# Patient Record
Sex: Female | Born: 1960 | Race: White | Hispanic: No | Marital: Married | Smoking: Former smoker
Health system: Southern US, Community
[De-identification: ages and names within clinical notes are randomized; demographics above are authoritative.]

## PROBLEM LIST (undated history)

## (undated) DIAGNOSIS — I219 Acute myocardial infarction, unspecified: Secondary | ICD-10-CM

## (undated) DIAGNOSIS — F32A Depression, unspecified: Secondary | ICD-10-CM

## (undated) DIAGNOSIS — Z87442 Personal history of urinary calculi: Secondary | ICD-10-CM

## (undated) DIAGNOSIS — I10 Essential (primary) hypertension: Secondary | ICD-10-CM

## (undated) DIAGNOSIS — N200 Calculus of kidney: Secondary | ICD-10-CM

## (undated) DIAGNOSIS — F329 Major depressive disorder, single episode, unspecified: Secondary | ICD-10-CM

## (undated) DIAGNOSIS — I2510 Atherosclerotic heart disease of native coronary artery without angina pectoris: Secondary | ICD-10-CM

## (undated) DIAGNOSIS — D509 Iron deficiency anemia, unspecified: Secondary | ICD-10-CM

## (undated) DIAGNOSIS — K219 Gastro-esophageal reflux disease without esophagitis: Secondary | ICD-10-CM

## (undated) DIAGNOSIS — E119 Type 2 diabetes mellitus without complications: Secondary | ICD-10-CM

## (undated) DIAGNOSIS — E782 Mixed hyperlipidemia: Secondary | ICD-10-CM

## (undated) DIAGNOSIS — F419 Anxiety disorder, unspecified: Secondary | ICD-10-CM

## (undated) DIAGNOSIS — N179 Acute kidney failure, unspecified: Secondary | ICD-10-CM

## (undated) DIAGNOSIS — I209 Angina pectoris, unspecified: Secondary | ICD-10-CM

## (undated) HISTORY — DX: Gastro-esophageal reflux disease without esophagitis: K21.9

## (undated) HISTORY — DX: Acute myocardial infarction, unspecified: I21.9

## (undated) HISTORY — DX: Anxiety disorder, unspecified: F41.9

## (undated) HISTORY — DX: Essential (primary) hypertension: I10

## (undated) HISTORY — DX: Mixed hyperlipidemia: E78.2

## (undated) HISTORY — DX: Calculus of kidney: N20.0

## (undated) HISTORY — DX: Acute kidney failure, unspecified: N17.9

## (undated) HISTORY — DX: Depression, unspecified: F32.A

## (undated) HISTORY — DX: Iron deficiency anemia, unspecified: D50.9

## (undated) HISTORY — DX: Type 2 diabetes mellitus without complications: E11.9

## (undated) HISTORY — DX: Major depressive disorder, single episode, unspecified: F32.9

## (undated) HISTORY — PX: KNEE SURGERY: SHX244

## (undated) HISTORY — DX: Atherosclerotic heart disease of native coronary artery without angina pectoris: I25.10

## (undated) HISTORY — PX: SINUS SURGERY WITH INSTATRAK: SHX5215

## (undated) HISTORY — PX: BACK SURGERY: SHX140

---
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
## 1998-11-21 ENCOUNTER — Other Ambulatory Visit: Admission: RE | Admit: 1998-11-21 | Discharge: 1998-11-21 | Payer: Self-pay | Admitting: Obstetrics and Gynecology

## 1999-01-08 ENCOUNTER — Ambulatory Visit (HOSPITAL_COMMUNITY): Admission: RE | Admit: 1999-01-08 | Discharge: 1999-01-08 | Payer: Self-pay | Admitting: Obstetrics and Gynecology

## 2010-07-23 ENCOUNTER — Inpatient Hospital Stay (HOSPITAL_COMMUNITY): Payer: BC Managed Care – PPO

## 2010-07-23 ENCOUNTER — Inpatient Hospital Stay (HOSPITAL_COMMUNITY)
Admission: EM | Admit: 2010-07-23 | Discharge: 2010-07-26 | DRG: 122 | Disposition: A | Discharge status: Home / Self Care | Payer: BC Managed Care – PPO | Source: Other Acute Inpatient Hospital | Attending: Cardiology | Admitting: Cardiology

## 2010-07-23 DIAGNOSIS — I214 Non-ST elevation (NSTEMI) myocardial infarction: Principal | ICD-10-CM | POA: Diagnosis present

## 2010-07-23 DIAGNOSIS — D500 Iron deficiency anemia secondary to blood loss (chronic): Secondary | ICD-10-CM

## 2010-07-23 DIAGNOSIS — E669 Obesity, unspecified: Secondary | ICD-10-CM | POA: Diagnosis present

## 2010-07-23 DIAGNOSIS — I2510 Atherosclerotic heart disease of native coronary artery without angina pectoris: Secondary | ICD-10-CM | POA: Diagnosis present

## 2010-07-23 DIAGNOSIS — E119 Type 2 diabetes mellitus without complications: Secondary | ICD-10-CM | POA: Diagnosis present

## 2010-07-23 DIAGNOSIS — F3289 Other specified depressive episodes: Secondary | ICD-10-CM | POA: Diagnosis present

## 2010-07-23 DIAGNOSIS — I10 Essential (primary) hypertension: Secondary | ICD-10-CM | POA: Diagnosis present

## 2010-07-23 DIAGNOSIS — N920 Excessive and frequent menstruation with regular cycle: Secondary | ICD-10-CM | POA: Diagnosis present

## 2010-07-23 DIAGNOSIS — F329 Major depressive disorder, single episode, unspecified: Secondary | ICD-10-CM | POA: Diagnosis present

## 2010-07-23 DIAGNOSIS — R079 Chest pain, unspecified: Secondary | ICD-10-CM

## 2010-07-23 DIAGNOSIS — D259 Leiomyoma of uterus, unspecified: Secondary | ICD-10-CM | POA: Diagnosis present

## 2010-07-23 DIAGNOSIS — Z7982 Long term (current) use of aspirin: Secondary | ICD-10-CM

## 2010-07-23 DIAGNOSIS — K219 Gastro-esophageal reflux disease without esophagitis: Secondary | ICD-10-CM | POA: Diagnosis present

## 2010-07-23 DIAGNOSIS — R7989 Other specified abnormal findings of blood chemistry: Secondary | ICD-10-CM

## 2010-07-23 LAB — LIPID PANEL
Cholesterol: 175 mg/dL (ref 0–200)
Total CHOL/HDL Ratio: 5.1 RATIO
VLDL: 45 mg/dL — ABNORMAL HIGH (ref 0–40)

## 2010-07-23 LAB — CK TOTAL AND CKMB (NOT AT ARMC)
CK, MB: 3.9 ng/mL (ref 0.3–4.0)
Relative Index: 3 — ABNORMAL HIGH (ref 0.0–2.5)
Relative Index: 2.4 (ref 0.0–2.5)
Total CK: 233 U/L — ABNORMAL HIGH (ref 7–177)
Total CK: 165 U/L (ref 7–177)

## 2010-07-23 LAB — IRON AND TIBC
Iron: <10 ug/dL — ABNORMAL LOW (ref 42–135)
UIBC: 359 ug/dL

## 2010-07-23 LAB — CBC
HCT: 27.2 % — ABNORMAL LOW (ref 36.0–46.0)
MCH: 22.9 pg — ABNORMAL LOW (ref 26.0–34.0)
MCH: 22.8 pg — ABNORMAL LOW (ref 26.0–34.0)
MCHC: 31.4 g/dL (ref 30.0–36.0)
MCHC: 31.3 g/dL (ref 30.0–36.0)
Platelets: 222 10*3/uL (ref 150–400)
RDW: 14.4 % (ref 11.5–15.5)

## 2010-07-23 LAB — PROTIME-INR: Prothrombin Time: 13.2 seconds (ref 11.6–15.2)

## 2010-07-23 LAB — BASIC METABOLIC PANEL
GFR calc Af Amer: >60 mL/min (ref >60)
GFR calc non Af Amer: >60 mL/min (ref >60)
Glucose, Bld: 227 mg/dL — ABNORMAL HIGH (ref 70–99)
Potassium: 3.2 mEq/L — ABNORMAL LOW (ref 3.5–5.1)
Sodium: 134 mEq/L — ABNORMAL LOW (ref 135–145)

## 2010-07-23 LAB — GLUCOSE, CAPILLARY
Glucose-Capillary: 301 mg/dL — ABNORMAL HIGH (ref 70–99)
Glucose-Capillary: 236 mg/dL — ABNORMAL HIGH (ref 70–99)

## 2010-07-23 LAB — TROPONIN I
Troponin I: 3.22 ng/mL (ref <0.30)
Troponin I: 2.5 ng/mL (ref <0.30)
Troponin I: 0.87 ng/mL (ref <0.30)

## 2010-07-23 LAB — HEPARIN LEVEL (UNFRACTIONATED): Heparin Unfractionated: 0.17 IU/mL — ABNORMAL LOW (ref 0.30–0.70)

## 2010-07-23 LAB — HEMOGLOBIN A1C: Mean Plasma Glucose: 292 mg/dL — ABNORMAL HIGH (ref <117)

## 2010-07-24 DIAGNOSIS — D649 Anemia, unspecified: Secondary | ICD-10-CM

## 2010-07-24 DIAGNOSIS — I2510 Atherosclerotic heart disease of native coronary artery without angina pectoris: Secondary | ICD-10-CM

## 2010-07-24 LAB — GLUCOSE, CAPILLARY
Glucose-Capillary: 243 mg/dL — ABNORMAL HIGH (ref 70–99)
Glucose-Capillary: 187 mg/dL — ABNORMAL HIGH (ref 70–99)
Glucose-Capillary: 235 mg/dL — ABNORMAL HIGH (ref 70–99)
Glucose-Capillary: 189 mg/dL — ABNORMAL HIGH (ref 70–99)

## 2010-07-24 LAB — BASIC METABOLIC PANEL
BUN: 10 mg/dL (ref 6–23)
CO2: 26 mEq/L (ref 19–32)
Chloride: 101 mEq/L (ref 96–112)
Creatinine, Ser: 0.67 mg/dL (ref 0.4–1.2)
Glucose, Bld: 208 mg/dL — ABNORMAL HIGH (ref 70–99)

## 2010-07-24 LAB — HEPARIN LEVEL (UNFRACTIONATED)
Heparin Unfractionated: 0.23 IU/mL — ABNORMAL LOW (ref 0.30–0.70)
Heparin Unfractionated: 0.51 IU/mL (ref 0.30–0.70)

## 2010-07-24 LAB — CBC
Hemoglobin: 8.1 g/dL — ABNORMAL LOW (ref 12.0–15.0)
MCH: 23.1 pg — ABNORMAL LOW (ref 26.0–34.0)
MCHC: 31.3 g/dL (ref 30.0–36.0)
MCV: 73.8 fL — ABNORMAL LOW (ref 78.0–100.0)

## 2010-07-24 NOTE — H&P (Signed)
NAMEIVORY, MADURO               ACCOUNT NO.:  [id number]  MEDICAL RECORD NO.:  [id number]           PATIENT TYPE:  I  LOCATION:  2302                         FACILITY:  [facility]  PHYSICIAN:  Georga Hacking, M.D.DATE OF BIRTH:  1960/09/27  DATE OF ADMISSION:  07/23/2010                             HISTORY & PHYSICAL   A 50 year old female who is transferred from [facility] for evaluation of chest pain and abnormal cardiac enzymes.  The patient has a history of hypertension, diabetes, and depression as well as reflux. She presented to the [facility] emergency room today with a 4-day history of chest discomfort.  She describes the pain as a dull aching pain, has been constant over the past 4 days, but with wax and wane in intensity. It would occasionally take her breath.  It was not pleuritic and not made worse with exercise, and because of the persistence of pain, she eventually sought help with the emergency room.  Initially, she was thought to have atypical symptoms more GI in origin.  The cardiac enzymes came back abnormal and the emergency room physician sought Cardiology consultation.  She has not had chest discomfort prior to this.  She was hospitalized last November with bronchitis.  She denies PND, orthopnea, edema, syncope, or claudication, but has felt some pounding of her heartbeat the past few days.  PAST HISTORY:  Hypertension, diabetes, esophageal reflux, kidney stones, and depression.  She does not know about hyperlipidemia.  SURGICAL HISTORY:  Knee surgery.  ALLERGIES:  None.  CURRENT MEDICATIONS: 1. Metformin 500 b.i.d. 2. Lisinopril 5 mg daily. 3. Paxil 10 mg daily. 4. Alprazolam 1 mg daily. 5. Omeprazole 40 mg daily.  FAMILY HISTORY:  Mother is living, age 61.  Father died of C. difficile at 32, three sisters are living.  No family history of premature heart disease.  SOCIAL HISTORY:  Married with a daughter.  She quit smoking in 2003 after  having smoked for 20-30 years one and one and a half packs per day.  She works as a Engineer, production for [employer].  Has significant alcohol use.  REVIEW OF SYSTEMS:  Reports some weight loss this year.  Denies skin problems.  She wears glasses, but has no glaucoma, cataracts, or macular degeneration.  No sinusitis or epistaxis.  She normally has no cough, wheezing, or hemoptysis.  She has a history of esophageal reflux, but has not had any nausea, constipation, or diarrhea.  Denies change in stool color.  No dysuria, urgency, or frequency.  She has occasional mild arthritis involving her lower back.  No history of stroke or TIA. Other than as noted above, the remainder of review of systems is unremarkable.  PHYSICAL EXAMINATION:  GENERAL:  She is a pleasant, obese, white female, who currently is in no acute distress. VITAL SIGNS:  Blood pressure is 125/70, pulse is currently 72 and regular. SKIN:  Warm and dry with no obvious masses or lesions. ENT:  EOMI.  PERRLA.  C and S clear.  Funduscopic exam not examined. Pharynx negative. NECK:  Supple without masses, JVD, thyromegaly, or bruits. LUNGS:  Clear to A and  P. CARDIAC:  Normal S1 and S2.  No S3, S4, or rub.  There is 1/6 systolic murmur heard at the aortic area. ABDOMEN:  Shows mildly tender epigastric area and in the right upper quadrant.  No rebound is noted.  Femoral pulses 2+.  Distal pulses are bounding and are 3+.  There is no edema noted. CRANIAL NERVES:  Sensory and motor were normal.  A 12-lead EKG done at [facility] did not show any acute ST change.  Review of laboratory data there showed anemia, it was microcytic.  Her albumin was 3.4, glucose is 361.  BUN was normal.  Creatinine is 0.72.  D-dimer was 0.6 slightly elevated.  Hematocrit was 30.2, hemoglobin is 9.8. Potassium is reported at 2.6.  Troponin is 0.73.  CPK total was elevated with an MB of 7.9.  IMPRESSION: 1. Chest discomfort with some atypical features,  but suggestive of     unstable angina pectoris.  Positive cardiac enzymes with no EKG     changes. 2. Diabetes mellitus, poorly controlled. 3. Hypertension. 4. Obesity. 5. History of esophageal reflux. 6. History of depression. 7. History of kidney stones.  RECOMMENDATIONS:  The patient would be placed on heparin, IV nitroglycerin, begun on beta blockers and aspirin, recheck serial enzymes.  If potassium remains low, give additional supplementation as she did have supplementation at [facility].  Because of her positive enzymes and diabetic state, she will likely need to have cardiac catheterization.  She will be kept n.p.o. for this.  Cardiac catheterization procedure were discussed with her fully including risks and she is agreeable and willing to proceed.     Georga Hacking, M.D.     WST/MEDQ  D:  07/23/2010  T:  07/23/2010  Job:  [id number]  cc:   Kirstie Peri, MD [facility]  Electronically Signed by W. Donnie Aho M.D. on 07/24/2010 01:09:27 PM

## 2010-07-25 LAB — CBC
Hemoglobin: 8.3 g/dL — ABNORMAL LOW (ref 12.0–15.0)
MCH: 22.6 pg — ABNORMAL LOW (ref 26.0–34.0)
MCV: 73.6 fL — ABNORMAL LOW (ref 78.0–100.0)
Platelets: 213 10*3/uL (ref 150–400)
RBC: 3.67 MIL/uL — ABNORMAL LOW (ref 3.87–5.11)
WBC: 5.5 10*3/uL (ref 4.0–10.5)

## 2010-07-25 LAB — GLUCOSE, CAPILLARY
Glucose-Capillary: 167 mg/dL — ABNORMAL HIGH (ref 70–99)
Glucose-Capillary: 230 mg/dL — ABNORMAL HIGH (ref 70–99)
Glucose-Capillary: 169 mg/dL — ABNORMAL HIGH (ref 70–99)

## 2010-07-25 LAB — BASIC METABOLIC PANEL
CO2: 25 mEq/L (ref 19–32)
Chloride: 104 mEq/L (ref 96–112)
Creatinine, Ser: 0.68 mg/dL (ref 0.4–1.2)
GFR calc Af Amer: >60 mL/min (ref >60)
Potassium: 3.7 mEq/L (ref 3.5–5.1)

## 2010-07-26 LAB — GLUCOSE, CAPILLARY: Glucose-Capillary: 168 mg/dL — ABNORMAL HIGH (ref 70–99)

## 2010-07-27 LAB — TYPE AND SCREEN
ABO/RH(D): O POS
Unit division: 0

## 2010-08-01 NOTE — Discharge Summary (Signed)
[name], Natalie Tanner               ACCOUNT NO.:  [id number]  MEDICAL RECORD NO.:  [id number]           PATIENT TYPE:  I  LOCATION:  2033                         FACILITY:  [facility]  PHYSICIAN:  Noralyn Pick. Eden Emms, MD, FACCDATE OF BIRTH:  09/03/1960  DATE OF ADMISSION:  07/23/2010 DATE OF DISCHARGE:  07/26/2010                              DISCHARGE SUMMARY   PRIMARY CARDIOLOGIST:  Jonelle Sidle, MD at [facility] in [place].  PRIMARY CARE PROVIDER:  Kirstie Peri, MD  OB/GYN:  Lendon Colonel, MD in [place].  DISCHARGE DIAGNOSIS:  Non-ST-segment elevation myocardial infarction.  SECONDARY DIAGNOSES: 1. Coronary artery disease, currently being medically managed in the     setting of small-vessel branch disease. 2. Poorly controlled type 2 diabetes mellitus. 3. Hypertension. 4. Hyperlipidemia. 5. Menometrorrhagia by OB/GYN this admission. 6. Iron-deficiency anemia secondary to menometrorrhagia. 7. Gastroesophageal reflux disease. 8. History of nephrolithiasis. 9. Depression. 10.Obesity.  ALLERGIES:  No known drug allergies.  PROCEDURES: 1. Transabdominal and transvaginal ultrasound of the pelvis showing     uterus measuring 9.3 x 6.3 x 6.4 cm with a 3.1-cm left posterior     fibroid.  Uterus was retroverted.  Normal-appearing thickness of     the endometrium, measuring 5 mm.  Normal ovarian size, is without     masses. 2. Diagnostic left heart cardiac catheterization performed on Jul 24, 2010, revealing small-vessel branch disease with 80% stenosis in     the superior branch of the first diagonal, 90% stenosis in the     inferior branch of the first diagonal, 90% stenosis in the second     diagonal with 99% stenosis in a more distal branch of the same     vessel.  There is a 99% stenosis in the third diagonal.  First     obtuse marginal has tandem 99% stenosis while the second obtuse     marginal has 99% stenosis.  Third obtuse marginal with scattered     99% stenosis.  Distal  circumflex with 70% stenosis prior to the     third obtuse marginal.  The right coronary artery was widely     patent.  The LAD was also widely patent.  EF was greater than 55%     with normal wall motion.  Anatomy was not felt to be minimal for     PCI or CABG secondary to small-vessel disease.  HISTORY OF PRESENT ILLNESS:  A 50 year old female without prior cardiac history who presented to the [facility] ED on Jul 23, 2010, with a 4-day history of intermittent chest discomfort and occasional pleuritic pain.  She was found to have elevated cardiac markers though her ECG was nonacute.  She was transferred to the [facility] for further evaluation.  HOSPITAL COURSE:  The patient ruled in for non-ST-segment elevation myocardial infarction, eventually peaking her CK of 233, MB 7.2, troponin I 3.22.  It is felt that the patient require cardiac catheterization, however, she was noted to be anemic with a hemoglobin and hematocrit of 8.5 and 27.2 on Jul 23, 2010.  Stools were sent for guaiac  and Gastroenterology was consulted.  They recognized that the microcytic hypochromic anemia is likely secondary to iron deficiency. The stool for guaiac was negative and thus no additional GI work was felt to be necessary.  The patient's iron was noted be less than 10 with a ferritin of 7.  TIBC was unable to be calculated due to low iron levels.  Of note, the patient did report heavy menses and as a result, GYN was consulted.  The patient was seen and pelvic ultrasound was performed as outlined above, revealing 3.1-cm left posterior fibroid with normal appearing and endometrium with normal thickness.  GYN felt that in the setting of MI, the patient required intervention with further anticoagulation, this would be okay and if she subsequently develops acute blood loss with menses, she could be treated with blood transfusions and possible hormonal management.  Acutely, regarding her metromenorrhagia,  GYN did not feel that she needed in-hospital management other than the ultrasound and further recommendation for screening Pap smear.  She felt that this could be performed as an outpatient and that management of metromenorrhagia could likely be carried out with hormonal management, non-hormonal treatment of bleeding, ablation, or hysterectomy.  With the patient's anemia fully evaluated, she was taken to the cath lab on Jul 24, 2010, for diagnostic catheterization.  As outlined above, this revealed severe small vessel branch disease with widely patent major vessels.  EF was greater than 55% with normal motion.  It was felt that the patient had multiple potential culprits, none were amenable to either PCI or CABG.  Therefore, medical therapy was recommended.  The patient's beta-blocker was titrated and isosorbide mononitrate was added.  She has been maintained on statin and low-dose aspirin, however, in the setting of anemia with menometrorrhagia, we have opted not to initiate Plavix therapy despite ACS.  Ms. Dantes has been ambulating without difficulty.  She has been evaluated by Cardiac Rehab and also diabetes management with education provided.  Diabetes management feels that she likely will require insulin going forward given a hemoglobin A1c of 11.8, however, we will defer to her primary care provider with regard to this decision.  The patient will be discharged home today in good condition.  DISCHARGE LABS:  Hemoglobin 8.3, hematocrit 27.0, WBC 5.5, platelets 213, INR 0.98.  Sodium 136, potassium 3.7, chloride 104, CO2 25, BUN 8, creatinine 0.68, glucose 236, calcium 8.5.  Hemoglobin A1c 11.8.  CK 165, MB 3.0, troponin I 0.7.  Total cholesterol 175, triglycerides 225, HDL 340, LDL 96.  TSH 3.306.  Serum iron less than 1.  TIBC not calculated, UIBC 359, vitamin B12 311, folate 15.3, ferritin 7.  Type and screen showed O positive blood.  MRSA screen was negative.  DISPOSITION:   The patient will be discharged home today in good condition.  FOLLOWUP PLANS AND APPOINTMENTS:  The patient was asked to follow up with her primary care provider by Dr. Sherryll Burger in the next 1-[redacted] weeks along with her OB/GYN physician, Dr. Ernestina Penna, in the same period of time. She has followup with Dr. Diona Browner at [facility] in [place] on August 13, 2010, at 2:40 p.m.  DISCHARGE MEDICATIONS: 1. Aspirin 81 mg daily. 2. Ferrous sulfate 325 mg b.i.d. 3. Imdur 30 mg daily. 4. Lisinopril 10 mg daily. 5. Metoprolol 50 mg b.i.d. 6. Nitroglycerin 0.4 mg sublingual p.r.n. chest pain. 7. Metformin 1000 mg b.i.d. to be resumed on Friday, Jul 27, 2010. 8. Alprazolam 1 mg daily p.r.n. 9. Omeprazole OTC 1 tab daily.  10.Paroxetine 20 mg daily.  OUTSTANDING LAB STUDIES:  Followup per primary care and GYN.  DURATION OF DISCHARGE ENCOUNTER:  40 minutes including physician time.  Thank you for following this patient.     Nicolasa Ducking, ANP   ______________________________ Noralyn Pick. Eden Emms, MD, [facility]    CB/MEDQ  D:  07/26/2010  T:  07/26/2010  Job:  [id number]  cc:   Kirstie Peri, MD Lendon Colonel, MD  Electronically Signed by Nicolasa Ducking ANP on 07/30/2010 07:47:24 PM Electronically Signed by Charlton Haws MD [facility] on 08/01/2010 08:33:56 PM

## 2010-08-02 NOTE — Cardiovascular Report (Signed)
  NAMEARIELA, MOCHIZUKI               ACCOUNT NO.:  [id number]  MEDICAL RECORD NO.:  [id number]           PATIENT TYPE:  I  LOCATION:  2302                         FACILITY:  [facility]  PHYSICIAN:  Arturo Morton. Riley Kill, MD, FACCDATE OF BIRTH:  12-22-1960  DATE OF PROCEDURE:  07/24/2010 DATE OF DISCHARGE:                           CARDIAC CATHETERIZATION   ADDENDUM:  In the distal circumflex, there is also 70% stenosis to the flexion point in the marginal branch.  It does not appear subtotaled at this location.     Arturo Morton. Riley Kill, MD, [facility]     TDS/MEDQ  D:  07/24/2010  T:  07/25/2010  Job:  [id number]  Electronically Signed by Shawnie Pons MD [facility] on 08/02/2010 09:20:22 AM

## 2010-08-02 NOTE — Cardiovascular Report (Signed)
Natalie Tanner, Natalie Tanner               ACCOUNT NO.:  [id number]  MEDICAL RECORD NO.:  [id number]           PATIENT TYPE:  I  LOCATION:  2302                         FACILITY[facility]  [facility]  PHYSICIAN:  Arturo Morton. Riley Kill, MD, FACCDATE OF BIRTH:  [birth year]  DATE OF PROCEDURE:  07/24/2010 DATE OF DISCHARGE:                           CARDIAC CATHETERIZATION   INDICATIONS:  Ms. Whitehurst is a 50 year old diabetic.  She quit smoking in 2003.  She developed chest pain and had positive enzymes and she was transferred to [facility] for further evaluation.  Notably, the patient is severely anemic.  She has what are thought to be uterine fibroids as the source.  Her stools are negative.  She has heavy menses and has been seen and evaluated by the GYN Service prior to cardiac catheterization. After considerable discussion, we felt that initial diagnostic catheterization was the best approach.  Risks, benefits and alternatives were discussed with the patient.  She consented to proceed.  PROCEDURE: 1. Left heart catheterization. 2. Selective coronary arteriography. 3. Selective left ventriculography.  DESCRIPTION OF PROCEDURE:  The procedure was performed from the right femoral artery using 4-French catheters.  We used a Smart needle to gain access.  A front wall stick on the first attempt was achieved.  There were no major complications and she was taken to the holding area in satisfactory clinical condition for sheath removal.  HEMODYNAMIC DATA: 1. Central aortic pressure 129/86, mean 106. 2. LV 146/21. 3. There was no gradient or pullback across the aortic valve.  ANGIOGRAPHIC DATA: 1. Ventriculography in the RAO projection reveals preserved global     systolic function.  Estimated ejection fraction is 55%. 2. The left main is free of critical disease. 3. The left anterior descending artery courses to the apex.  It is a     large-caliber vessel.  There was minimal luminal irregularity  throughout the midportion of the artery but no more than about 30%     narrowing.  The vessel bifurcates distally into two apical     branches.  Notably, there was some severe disease involving the     diagonal branches.  The first diagonal branch is a fairly small-     caliber vessel being 1.5 mm.  It bifurcates, and both the inferior     and superior branches have 80 and 90% areas of disease.  The     overall caliber is less than 1.5 mm in these distal branches.  The     second diagonal branch also is somewhat larger in caliber     proximally and there is a 90% ostial stenosis.  However, the vessel     distally divides, and subtotally occlude where it divides into two     small branches which are clearly less than 1.3 mm in size.  There     is a third diagonal branch which is a ghost vessel seen on only     late images probably with subtotally occluded as well. 4. The circumflex provides a large-caliber vessel.  Going out distally     into the marginal system it is widely patent.  There was a first     tiny marginal subbranch or branch that has two 99% stenoses and is     barely visible less than 1.5 mm.  There is likewise a second     marginal subbranch with 99% stenosis which is also marginally     visible.  Distally, there are tiny subbranches which also had     severe disease in the most distal portion of the branch. 5. The right coronary artery is a large-caliber vessel providing a     posterior descending and actually three posterolateral branches all     of which are free of critical disease.  CONCLUSIONS: 1. Well-preserved left ventricular function. 2. Non-ST-elevation myocardial infarction. 3. Subbranch disease involving the diagonals and tiny marginals as     noted in the above text.  DISCUSSION:  At the present time, our leaning would be in the direction of medical therapy.  All of the subbranches are relatively small and would not tolerate stenting first of all.  They  are 1.5 mm or smaller in size and medical therapy would be warranted based on vessel size, multiple culprits, and prior studies demonstrating similar outcomes in medically treated patients.  In addition, she is challenged from a hemoglobin standpoint at present time with a hemoglobin of 8 due to uterine bleeding.  She would not be a good candidate for dual antiplatelet therapy, therefore, Plavix will be withheld.  She will need close followup.     Arturo Morton. Riley Kill, MD, [facility]     TDS/MEDQ  D:  07/24/2010  T:  07/25/2010  Job:  [id number]  cc:   Noralyn Pick. Eden Emms, MD, [facility] Kirstie Peri, MD Jonelle Sidle, MD  Electronically Signed by Shawnie Pons MD [facility] on 08/02/2010 09:20:17 AM

## 2010-08-13 ENCOUNTER — Encounter: Payer: Self-pay | Admitting: *Deleted

## 2010-08-13 ENCOUNTER — Encounter: Payer: Self-pay | Admitting: Cardiology

## 2010-08-13 ENCOUNTER — Ambulatory Visit (INDEPENDENT_AMBULATORY_CARE_PROVIDER_SITE_OTHER): Payer: BC Managed Care – PPO | Admitting: Cardiology

## 2010-08-13 VITALS — BP 125/78 | HR 69 | Ht 63.0 in | Wt 173.8 lb

## 2010-08-13 DIAGNOSIS — E782 Mixed hyperlipidemia: Secondary | ICD-10-CM | POA: Insufficient documentation

## 2010-08-13 DIAGNOSIS — I214 Non-ST elevation (NSTEMI) myocardial infarction: Secondary | ICD-10-CM

## 2010-08-13 DIAGNOSIS — E119 Type 2 diabetes mellitus without complications: Secondary | ICD-10-CM | POA: Insufficient documentation

## 2010-08-13 DIAGNOSIS — I2510 Atherosclerotic heart disease of native coronary artery without angina pectoris: Secondary | ICD-10-CM | POA: Insufficient documentation

## 2010-08-13 DIAGNOSIS — E876 Hypokalemia: Secondary | ICD-10-CM

## 2010-08-13 DIAGNOSIS — D509 Iron deficiency anemia, unspecified: Secondary | ICD-10-CM | POA: Insufficient documentation

## 2010-08-13 DIAGNOSIS — I10 Essential (primary) hypertension: Secondary | ICD-10-CM | POA: Insufficient documentation

## 2010-08-13 NOTE — Patient Instructions (Signed)
Follow up as scheduled. Your physician recommends that you go to the [facility] for lab work: [employer]. DO LAB A FEW DAYS BEFORE July APPT. Your physician recommends that you continue on your current medications as directed. Please refer to the Current Medication list given to you today.

## 2010-08-13 NOTE — Assessment & Plan Note (Signed)
Sodium restriction, continue medical therapy.

## 2010-08-13 NOTE — Progress Notes (Signed)
Clinical Summary Ms. Natalie Tanner is a 50 y.o.female presenting for a post hospital visit. This is my first meeting with her. I note that she presented in transfer from [facility] to [facility] back in May with a NSTEMI, underwent cardiac catheterization as detailed below, and was managed medically with small vessel branch disease.  She was noted to be anemic during her hospital stay, guaiac negative, and found to be iron deficient. She reported heavy menses and was seen by Gynecology, findings of a 3.1 cm left posterior fibroid. This was managed conservatively with plan for outpatient followup.  She presents today with her husband. We discussed the results and implications regarding her cardiac catheterization findings. She reports no recurrent chest pain or unusual shortness of breath, and would like to return to work at [employer] next Monday with her typical activities. She states that she has to lift no more than 15 to 20 pounds at a time.  She has not yet had follow up with a gynecologist, states she would like to establish with someone in [place]. I explained to her that it is important that she maintain this type of followup. She states that she has seen Dr. Sherryll Burger, and that he recently obtained a lipid profile. Lipid numbers from her hospitalization were reviewed. She is not on statin therapy at this point.  Otherwise she reports compliance with her medications and that her glucose has been much better controlled. I discussed the importance of good control of diabetes, lipid status, diet, and exercise.   No Known Allergies  Current outpatient prescriptions:ALPRAZolam (XANAX) 1 MG tablet, Take 1 mg by mouth at bedtime as needed.  , Disp: , Rfl: ;  aspirin 81 MG tablet, Take 81 mg by mouth daily.  , Disp: , Rfl: ;  ferrous sulfate 325 (65 FE) MG tablet, Take 325 mg by mouth 2 (two) times daily.  , Disp: , Rfl: ;  isosorbide mononitrate (IMDUR) 30 MG 24 hr tablet, Take 30 mg by mouth daily.  , Disp: , Rfl:    lisinopril (PRINIVIL,ZESTRIL) 10 MG tablet, Take 10 mg by mouth daily.  , Disp: , Rfl: ;  metFORMIN (GLUCOPHAGE) 1000 MG tablet, Take 1,000 mg by mouth 2 (two) times daily with a meal.  , Disp: , Rfl: ;  metoprolol (LOPRESSOR) 50 MG tablet, Take 50 mg by mouth 2 (two) times daily.  , Disp: , Rfl: ;  omeprazole (PRILOSEC OTC) 20 MG tablet, Take 20 mg by mouth daily.  , Disp: , Rfl:  PARoxetine (PAXIL) 20 MG tablet, Take 20 mg by mouth every morning.  , Disp: , Rfl: ;  nitroGLYCERIN (NITROSTAT) 0.4 MG SL tablet, Place 0.4 mg under the tongue every 5 (five) minutes as needed.  , Disp: , Rfl:   Past Medical History  Diagnosis Date  . Coronary atherosclerosis of native coronary artery     Managed medically following cardiac catheterization 5/12  . Type 2 diabetes mellitus   . Essential hypertension, benign   . Mixed hyperlipidemia   . Iron deficiency anemia     Secondary to minimum menometrorrhagia  . Nephrolithiasis   . Depression   . GERD (gastroesophageal reflux disease)   . Myocardial infarction     NSTEMI 5/12    Past Surgical History  Procedure Date  . Knee surgery     Family History  Problem Relation Age of Onset  . Other Father     Clostridium difficile    Social History Natalie Tanner reports that she  quit smoking about 9 years ago. Her smoking use included Cigarettes. She quit after 20 years of use. She has never used smokeless tobacco. Natalie Tanner reports that she does not drink alcohol.  Review of Systems Reported menses after discharge from the hospital. No melena or hematochezia. No palpitations or syncope. Otherwise negative.  Physical Examination Filed Vitals:   08/13/10 1442  BP: 125/78  Pulse: 69  Overweight woman in no acute distress. HEENT: Conjunctiva and lids normal, oropharynx with moist mucosa. Neck: Supple, no elevated JVP or carotid bruits, no thyromegaly. Lungs: Clear to auscultation, nontender. Cardiac: Regular rate and rhythm, no S3 or rub. Lungs:  Clear to auscultation, nontender. Abdomen: Soft, nontender, bowel sounds present. Extremities: No pitting edema, distal pulses full. Skin: Warm and dry. Musculoskeletal: No kyphosis. Neuropsychiatric: Alert and oriented x3, affect appropriate.   Studies  ANGIOGRAPHIC DATA:   1. Ventriculography in the RAO projection reveals preserved global       systolic function.  Estimated ejection fraction is 55%.   2. The left main is free of critical disease.   3. The left anterior descending artery courses to the apex.  It is a       large-caliber vessel.  There was minimal luminal irregularity       throughout the midportion of the artery but no more than about 30%       narrowing.  The vessel bifurcates distally into two apical       branches.  Notably, there was some severe disease involving the       diagonal branches.  The first diagonal branch is a fairly small-       caliber vessel being 1.5 mm.  It bifurcates, and both the inferior       and superior branches have 80 and 90% areas of disease.  The       overall caliber is less than 1.5 mm in these distal branches.  The       second diagonal branch also is somewhat larger in caliber       proximally and there is a 90% ostial stenosis.  However, the vessel       distally divides, and subtotally occlude where it divides into two       small branches which are clearly less than 1.3 mm in size.  There       is a third diagonal branch which is a ghost vessel seen on only       late images probably with subtotally occluded as well.   4. The circumflex provides a large-caliber vessel.  Going out distally       into the marginal system it is widely patent.  There was a first       tiny marginal subbranch or branch that has two 99% stenoses and is       barely visible less than 1.5 mm.  There is likewise a second       marginal subbranch with 99% stenosis which is also marginally       visible.  Distally, there are tiny subbranches which also  had       severe disease in the most distal portion of the branch.   5. The right coronary artery is a large-caliber vessel providing a       posterior descending and actually three posterolateral branches all       of which are free of critical disease.   ADDENDUM:  In the distal circumflex,  there is also 70% stenosis to the   flexion point in the marginal branch.  It does not appear subtotaled at   this location.  Problem List and Plan

## 2010-08-13 NOTE — Assessment & Plan Note (Signed)
With history of heavy menses and documented fibroids. I underscored the importance of followup with Gynecology. Patient states she plans to arrange this in [place].

## 2010-08-13 NOTE — Assessment & Plan Note (Signed)
Will request recent lipid profile obtained by Dr. Sherryll Burger. I would anticipate that statin therapy will be necessary however in light of diabetes mellitus and documented CAD.

## 2010-08-13 NOTE — Assessment & Plan Note (Signed)
Noted at initial presentation at [facility]. Repleted during hospital stay. Reportedly not on any diuretics at baseline. Followup BMET to be obtained prior to next visit.

## 2010-08-13 NOTE — Assessment & Plan Note (Signed)
Discussed the importance of good glucose control. Continue follow up with Dr. Sherryll Burger.

## 2010-08-13 NOTE — Assessment & Plan Note (Signed)
Patient approximately 3 weeks out from her presentation with mild NSTEMI, troponin I 3.2. Medical therapy is planned at this time, no Plavix use in light of iron deficiency anemia with heavy menses. She would like to return to work with prior job responsibilities at [employer], typically lifting no more than 15-20 pounds at a time. Plan is to return to work this coming Monday, June 18. Exercise, potential cardiac rehabilitation is recommended. Otherwise office followup.

## 2010-08-13 NOTE — Assessment & Plan Note (Signed)
Mainly small branch vessel disease, being managed medically at this time based on recent cardiac catheterization results. Patient is not reporting any angina or unusual shortness of breath. Continue observation. We did discuss diet and exercise.

## 2010-08-15 ENCOUNTER — Telehealth: Payer: Self-pay | Admitting: *Deleted

## 2010-08-15 NOTE — Telephone Encounter (Signed)
Left message with female at residence for pt to call back regarding results.

## 2010-08-15 NOTE — Telephone Encounter (Signed)
Message copied by Arlyss Gandy on Wed Aug 15, 2010  9:16 AM ------      Message from: MCDOWELL, Illene Bolus      Created: Mon Aug 13, 2010  4:53 PM       Results reviewed. LDL 110. Would recommend statin therapy, potentially beginning Lipitor at 10 mg daily. Followup fasting lipid profile and liver function tests in approximately 12 weeks.

## 2010-08-17 MED ORDER — ATORVASTATIN CALCIUM 10 MG PO TABS: 10.0000 mg | ORAL_TABLET | Freq: Every day | ORAL | Status: DC

## 2010-08-17 NOTE — Telephone Encounter (Signed)
Pt notified of results and verbalized understanding  

## 2010-08-17 NOTE — Telephone Encounter (Signed)
Left message with female at residence for pt to return call. He stated pt was sleeping.

## 2010-09-20 ENCOUNTER — Ambulatory Visit (INDEPENDENT_AMBULATORY_CARE_PROVIDER_SITE_OTHER): Payer: BC Managed Care – PPO | Admitting: Cardiology

## 2010-09-20 ENCOUNTER — Encounter: Payer: Self-pay | Admitting: *Deleted

## 2010-09-20 VITALS — BP 104/69 | HR 67 | Ht 63.0 in | Wt 177.0 lb

## 2010-09-20 DIAGNOSIS — D509 Iron deficiency anemia, unspecified: Secondary | ICD-10-CM

## 2010-09-20 DIAGNOSIS — I10 Essential (primary) hypertension: Secondary | ICD-10-CM

## 2010-09-20 DIAGNOSIS — I2510 Atherosclerotic heart disease of native coronary artery without angina pectoris: Secondary | ICD-10-CM

## 2010-09-20 DIAGNOSIS — E782 Mixed hyperlipidemia: Secondary | ICD-10-CM

## 2010-09-20 NOTE — Progress Notes (Signed)
Clinical Summary Ms. Ligman is a 50 y.o.female presenting for followup. She was seen back in June for a post hospital visit.  Interval lab work includes an LDL of 110, recommended that she start on low-dose atorvastatin. More recently lab work from July 16 showed BUN 14, creatinine 0.8, sodium 138, potassium 4.3.  Patient states that she has not taken atorvastatin given concerns about potential side effects. We reviewed these today, and she is in agreement to at least try it for a period of time. She reports no significant angina. Does get fatigued at work after about 4 or 5 hours, however indicates that she is able to accomplish her job tasks. She reports compliance with her medications.  No Known Allergies  Current outpatient prescriptions:ALPRAZolam (XANAX) 1 MG tablet, Take 1 mg by mouth at bedtime as needed.  , Disp: , Rfl: ;  aspirin 81 MG tablet, Take 81 mg by mouth daily.  , Disp: , Rfl: ;  atorvastatin (LIPITOR) 10 MG tablet, Take 1 tablet (10 mg total) by mouth at bedtime., Disp: 30 tablet, Rfl: 6;  ferrous sulfate 325 (65 FE) MG tablet, Take 325 mg by mouth 2 (two) times daily.  , Disp: , Rfl:  isosorbide mononitrate (IMDUR) 30 MG 24 hr tablet, Take 30 mg by mouth daily.  , Disp: , Rfl: ;  lisinopril (PRINIVIL,ZESTRIL) 10 MG tablet, Take 10 mg by mouth daily.  , Disp: , Rfl: ;  metFORMIN (GLUCOPHAGE) 1000 MG tablet, Take 1,000 mg by mouth 2 (two) times daily with a meal.  , Disp: , Rfl: ;  metoprolol (LOPRESSOR) 50 MG tablet, Take 50 mg by mouth 2 (two) times daily.  , Disp: , Rfl:  nitroGLYCERIN (NITROSTAT) 0.4 MG SL tablet, Place 0.4 mg under the tongue every 5 (five) minutes as needed.  , Disp: , Rfl: ;  omeprazole (PRILOSEC OTC) 20 MG tablet, Take 20 mg by mouth daily.  , Disp: , Rfl: ;  PARoxetine (PAXIL) 20 MG tablet, Take 20 mg by mouth every morning.  , Disp: , Rfl:   Past Medical History  Diagnosis Date  . Coronary atherosclerosis of native coronary artery     Managed medically  following cardiac catheterization 5/12  . Type 2 diabetes mellitus   . Essential hypertension, benign   . Mixed hyperlipidemia   . Iron deficiency anemia     Secondary to minimum menometrorrhagia  . Nephrolithiasis   . Depression   . GERD (gastroesophageal reflux disease)   . Myocardial infarction     NSTEMI 5/12    Social History Ms. Rahe reports that she quit smoking about 9 years ago. Her smoking use included Cigarettes. She has a 20 pack-year smoking history. She has never used smokeless tobacco. Ms. Laba reports that she does not drink alcohol.  Review of Systems Reports some leg achiness at baseline. No palpitations or syncope. Stable appetite. Otherwise negative.  Physical Examination Filed Vitals:   09/20/10 1510  BP: 104/69  Pulse: 67  Overweight woman in no acute distress.  HEENT: Conjunctiva and lids normal, oropharynx with moist mucosa.  Neck: Supple, no elevated JVP or carotid bruits, no thyromegaly.  Lungs: Clear to auscultation, nontender.  Cardiac: Regular rate and rhythm, no S3 or rub.  Lungs: Clear to auscultation, nontender.  Abdomen: Soft, nontender, bowel sounds present.  Extremities: No pitting edema, distal pulses full.  Skin: Warm and dry.  Musculoskeletal: No kyphosis.  Neuropsychiatric: Alert and oriented x3, affect appropriate.    Problem List and  Plan

## 2010-09-20 NOTE — Patient Instructions (Signed)
Follow up as scheduled. Your physician recommends that you go to the [facility] for a FASTING lipid profile and liver function labs. Do not eat or drink after midnight. DO LABS BEFORE APPT IN October. CALL THE OFFICE TO HAVE ORDER SENT FOR LABS. Your physician recommends that you continue on your current medications as directed. Please refer to the Current Medication list given to you today.

## 2010-09-20 NOTE — Assessment & Plan Note (Signed)
Patient agrees to trial course of low-dose atorvastatin. Anticipate followup fasting lipid profile and liver function tests over the next 12 weeks.

## 2010-09-20 NOTE — Assessment & Plan Note (Signed)
Continue medical therapy and observation. Symptomatically stable.

## 2010-09-20 NOTE — Assessment & Plan Note (Signed)
In the setting of uterine fibroids, patient reports gynecology visit pending in August.

## 2010-09-20 NOTE — Assessment & Plan Note (Signed)
No change to present regimen. 

## 2010-12-20 ENCOUNTER — Other Ambulatory Visit: Payer: Self-pay | Admitting: *Deleted

## 2010-12-20 DIAGNOSIS — Z79899 Other long term (current) drug therapy: Secondary | ICD-10-CM

## 2010-12-20 DIAGNOSIS — E782 Mixed hyperlipidemia: Secondary | ICD-10-CM

## 2010-12-25 ENCOUNTER — Ambulatory Visit (INDEPENDENT_AMBULATORY_CARE_PROVIDER_SITE_OTHER): Payer: BC Managed Care – PPO | Admitting: Cardiology

## 2010-12-25 ENCOUNTER — Encounter: Payer: Self-pay | Admitting: Cardiology

## 2010-12-25 VITALS — BP 132/82 | HR 70 | Ht 63.0 in | Wt 180.0 lb

## 2010-12-25 DIAGNOSIS — I2510 Atherosclerotic heart disease of native coronary artery without angina pectoris: Secondary | ICD-10-CM

## 2010-12-25 DIAGNOSIS — E782 Mixed hyperlipidemia: Secondary | ICD-10-CM

## 2010-12-25 DIAGNOSIS — I10 Essential (primary) hypertension: Secondary | ICD-10-CM

## 2010-12-25 NOTE — Progress Notes (Signed)
Clinical Summary Natalie Tanner is a 50 y.o.female presenting for followup. She was seen in July.  She describes stress at her job. Occasionally has chest pain, and has used nitroglycerin a few times, although without definite relief. She reports compliance with her medications.  Recent followup lab work showed AST 16, ALT 17, triglycerides 68, cholesterol 202, HDL 55, LDL 133. States that she has been taking low-dose Lipitor. Her diet has however not been optimal. We reviewed this today.  She does not check blood pressure at home. Also no regular exercise at this point.  No Known Allergies  Medication list reviewed.  Past Medical History  Diagnosis Date  . Coronary atherosclerosis of native coronary artery     Managed medically following cardiac catheterization 5/12  . Type 2 diabetes mellitus   . Essential hypertension, benign   . Mixed hyperlipidemia   . Iron deficiency anemia     Secondary to minimum menometrorrhagia  . Nephrolithiasis   . Depression   . GERD (gastroesophageal reflux disease)   . Myocardial infarction     NSTEMI 5/12    Past Surgical History  Procedure Date  . Knee surgery     Family History  Problem Relation Age of Onset  . Other Father     Clostridium difficile    Social History Ms. Pietro reports that she quit smoking about 9 years ago. Her smoking use included Cigarettes. She has a 20 pack-year smoking history. She has never used smokeless tobacco. Ms. Fate reports that she does not drink alcohol.  Review of Systems No palpitations. Still reports problems with heavy menses. Plans to seek second opinion with gynecologist. Otherwise negative except as outlined.  Physical Examination Filed Vitals:   12/25/10 1429  BP: 132/82  Pulse: 70    Overweight woman in no acute distress.  HEENT: Conjunctiva and lids normal, oropharynx with moist mucosa.  Neck: Supple, no elevated JVP or carotid bruits, no thyromegaly.  Lungs: Clear to auscultation,  nontender.  Cardiac: Regular rate and rhythm, no S3 or rub.  Lungs: Clear to auscultation, nontender.  Abdomen: Soft, nontender, bowel sounds present.  Extremities: No pitting edema, distal pulses full.  Skin: Warm and dry.  Musculoskeletal: No kyphosis.  Neuropsychiatric: Alert and oriented x3, affect appropriate.   Problem List and Plan

## 2010-12-25 NOTE — Assessment & Plan Note (Signed)
Continue followup with Dr. Shah. 

## 2010-12-25 NOTE — Assessment & Plan Note (Signed)
LDL not at goal. Continue current dose of Lipitor. Discussed diet today. Followup fasting lipid profile and liver function tests for her next visit. We may need to advance statin therapy at that time.

## 2010-12-25 NOTE — Assessment & Plan Note (Signed)
Recommended that she check her blood pressure at home periodically, bring in a record when she comes back to the office.

## 2010-12-25 NOTE — Assessment & Plan Note (Signed)
Relatively stable symptomatically. Plan to continue medical therapy. We discussed diet and exercise, also stress reduction. Continue observation.

## 2010-12-25 NOTE — Patient Instructions (Signed)
Your physician wants you to follow-up in: 4 months. You will receive a reminder letter in the mail one-two months in advance. If you don't receive a letter, please call our office to schedule the follow-up appointment. Your physician recommends that you go to the [facility] for a FASTING lipid profile and liver function labs. Do not eat or drink after midnight. DO IN 4 MONTHS BEFORE OFFICE VISIT. Your physician recommends that you continue on your current medications as directed. Please refer to the Current Medication list given to you today.

## 2011-05-03 ENCOUNTER — Telehealth: Payer: Self-pay | Admitting: *Deleted

## 2011-05-03 DIAGNOSIS — E782 Mixed hyperlipidemia: Secondary | ICD-10-CM

## 2011-05-03 DIAGNOSIS — Z79899 Other long term (current) drug therapy: Secondary | ICD-10-CM

## 2011-05-03 NOTE — Telephone Encounter (Signed)
Pt's husband requesting a return call.  Spoke w/pt's husband who states pt was due for office visit and labs last month. Pt scheduled for follow up per recall. She will go to the [facility] to have labs done. Pt's husband notified pt should be fasting for labs.

## 2011-06-06 ENCOUNTER — Encounter: Payer: Self-pay | Admitting: Cardiology

## 2011-06-06 ENCOUNTER — Ambulatory Visit (INDEPENDENT_AMBULATORY_CARE_PROVIDER_SITE_OTHER): Payer: BC Managed Care – PPO | Admitting: Cardiology

## 2011-06-06 VITALS — BP 151/95 | HR 71 | Ht 63.0 in | Wt 186.0 lb

## 2011-06-06 DIAGNOSIS — I10 Essential (primary) hypertension: Secondary | ICD-10-CM

## 2011-06-06 DIAGNOSIS — Z79899 Other long term (current) drug therapy: Secondary | ICD-10-CM

## 2011-06-06 DIAGNOSIS — E782 Mixed hyperlipidemia: Secondary | ICD-10-CM

## 2011-06-06 DIAGNOSIS — I2510 Atherosclerotic heart disease of native coronary artery without angina pectoris: Secondary | ICD-10-CM

## 2011-06-06 MED ORDER — ISOSORBIDE MONONITRATE ER 30 MG PO TB24: 30.0000 mg | ORAL_TABLET | Freq: Two times a day (BID) | ORAL | Status: DC

## 2011-06-06 MED ORDER — LISINOPRIL 20 MG PO TABS: 20.0000 mg | ORAL_TABLET | Freq: Every day | ORAL | Status: DC

## 2011-06-06 NOTE — Patient Instructions (Signed)
Your physician wants you to follow-up in: 3 months. You will receive a reminder letter in the mail one-two months in advance. If you don't receive a letter, please call our office to schedule the follow-up appointment. Increase Imdur (isosorbide) to 30 mg two times a day.  Increase Lisinopril to 20 mg daily. You may take _2_ of your _10_ mg tablets daily until gone, and then get new prescription filled for _20_ mg tablets. A new prescription was sent to your pharmacy to reflect this change.   Resume Lipitor (atorvastatin) 10 mg every evening. Your physician recommends that you go to the [facility] for lab work: BMET--DO LAB IN 2 WEEKS (AROUND 06/20/11) Your physician recommends that you go to the [facility] for a FASTING lipid profile and liver function labs. Do not eat or drink after midnight. DO LAB IN 3 MONTHS BEFORE OFFICE VISIT.  If the results of your test are normal or stable, you will receive a letter. If they are abnormal, the nurse will contact you by phone.

## 2011-06-06 NOTE — Assessment & Plan Note (Signed)
Continue followup with Dr. Shah. 

## 2011-06-06 NOTE — Progress Notes (Signed)
Clinical Summary Natalie Tanner is a 51 y.o.female presenting for followup. She was seen in October 2012. She is here with her husband. States that she has been having more chest pain symptoms since last visit, also reports stress at work, recent [employer]. She is using more nitroglycerin. Followup ECG is normal.  Recent lab work shows normal AST and ALT, cholesterol 255, triglycerides 142, HDL 58, LDL 169. We discussed these results today. She states that she has not been taking her Lipitor.  Today we reviewed diet, medication adjustments. She states she will be having sinus surgery with Dr. Andrey Campanile in May. She will be temporarily off aspirin.   No Known Allergies  Current Outpatient Prescriptions  Medication Sig Dispense Refill  . ALPRAZolam (XANAX) 1 MG tablet Take 1 mg by mouth at bedtime as needed.        Marland Kitchen aspirin 81 MG tablet Take 81 mg by mouth daily.        Marland Kitchen atorvastatin (LIPITOR) 10 MG tablet Take 1 tablet (10 mg total) by mouth at bedtime.  30 tablet  6  . ferrous sulfate 325 (65 FE) MG tablet Take 325 mg by mouth 2 (two) times daily.        . isosorbide mononitrate (IMDUR) 30 MG 24 hr tablet Take 30 mg by mouth daily.        . metFORMIN (GLUCOPHAGE) 1000 MG tablet Take 1,000 mg by mouth 2 (two) times daily with a meal.        . metoprolol (LOPRESSOR) 50 MG tablet Take 50 mg by mouth 2 (two) times daily.        . nitroGLYCERIN (NITROSTAT) 0.4 MG SL tablet Place 0.4 mg under the tongue every 5 (five) minutes as needed.        Marland Kitchen omeprazole (PRILOSEC OTC) 20 MG tablet Take 20 mg by mouth daily.        Marland Kitchen PARoxetine (PAXIL) 20 MG tablet Take 20 mg by mouth every morning.          Past Medical History  Diagnosis Date  . Coronary atherosclerosis of native coronary artery     Managed medically following cardiac catheterization 5/12  . Type 2 diabetes mellitus   . Essential hypertension, benign   . Mixed hyperlipidemia   . Iron deficiency anemia     Secondary to minimum  menometrorrhagia  . Nephrolithiasis   . Depression   . GERD (gastroesophageal reflux disease)   . Myocardial infarction     NSTEMI 5/12    Past Surgical History  Procedure Date  . Knee surgery     Family History  Problem Relation Age of Onset  . Other Father     Clostridium difficile    Social History Natalie Tanner reports that she quit smoking about 10 years ago. Her smoking use included Cigarettes. She has a 20 pack-year smoking history. She has never used smokeless tobacco. Natalie Tanner reports that she does not drink alcohol.  Review of Systems No palpitations or syncope. No reported bleeding problems. Otherwise negative.  Physical Examination Filed Vitals:   06/06/11 0944  BP: 151/95  Pulse: 71   Overweight woman in no acute distress.  HEENT: Conjunctiva and lids normal, oropharynx with moist mucosa.  Neck: Supple, no elevated JVP or carotid bruits, no thyromegaly.  Lungs: Clear to auscultation, nontender.  Cardiac: Regular rate and rhythm, no S3 or rub.  Lungs: Clear to auscultation, nontender.  Abdomen: Soft, nontender, bowel sounds present.  Extremities: No pitting  edema, distal pulses full.  Skin: Warm and dry.  Musculoskeletal: No kyphosis.  Neuropsychiatric: Alert and oriented x3, affect appropriate.   ECG Normal sinus rhythm at 71.    Problem List and Plan

## 2011-06-06 NOTE — Assessment & Plan Note (Signed)
Blood pressure is elevated today, patient states it has also been elevated at home in similar range if not higher. Lisinopril will be increased to 20 mg daily, followup BMET in 2 weeks.

## 2011-06-06 NOTE — Assessment & Plan Note (Signed)
LDL has gone higher. She states that she has not been taking Lipitor as directed. She is willing to try the medication, also work on diet. We will recheck FLP and LFT in 3 months.

## 2011-06-06 NOTE — Assessment & Plan Note (Signed)
She has been experiencing more chest pain since last visit, followup ECG is normal today. Plan is to increase Imdur to 30 mg twice daily, continue observation for now. We will also be making changes in her antihypertensive regimen and resuming statin therapy. Followup arranged.

## 2011-07-08 ENCOUNTER — Encounter: Payer: Self-pay | Admitting: *Deleted

## 2011-08-07 ENCOUNTER — Other Ambulatory Visit: Payer: Self-pay | Admitting: Nurse Practitioner

## 2012-02-19 ENCOUNTER — Telehealth: Payer: Self-pay | Admitting: *Deleted

## 2012-02-19 NOTE — Telephone Encounter (Signed)
My understanding is that the patient is to undergo lithotripsy today, not surgery. She has known CAD, mainly small branch vessel disease, with recurrent angina that we have been managing medically. I last saw her in April and medical therapy was adjusted. I reviewed the ECG sent over today which is normal, no significant change compared to the previous tracing from April. Unless she is reporting significantly escalating symptoms since her last visit, I would think that she could go ahead and proceed with lithotripsy on medical therapy with an acceptable periprocedural cardiac risk.

## 2012-02-19 NOTE — Telephone Encounter (Addendum)
Patient is scheduled for kidney stone procedure today at [facility] and during history review today, patient told the nurse she had chest pain on Saturday night and she didn't report to our office. Lorna from Day Surgery called to say that with this complain, their protocol is to do and EKG and they will send results to [facility] to read results, and will need approval to go ahead with procedure today.

## 2012-02-19 NOTE — Telephone Encounter (Signed)
Lorna at short stay informed. Note faxed to Short Stay.

## 2012-03-13 ENCOUNTER — Telehealth: Payer: Self-pay | Admitting: *Deleted

## 2012-03-13 NOTE — Telephone Encounter (Signed)
Patient informed via voicemail.

## 2012-03-13 NOTE — Telephone Encounter (Signed)
Message copied by Eustace Moore on Fri Mar 13, 2012 11:02 AM ------      Message from: MCDOWELL, Illene Bolus      Created: Tue Mar 10, 2012  3:00 PM       LDL has come down to 133. Review compliance with diet and medication.

## 2012-04-14 ENCOUNTER — Ambulatory Visit (INDEPENDENT_AMBULATORY_CARE_PROVIDER_SITE_OTHER): Payer: BC Managed Care – PPO | Admitting: Cardiology

## 2012-04-14 ENCOUNTER — Encounter: Payer: Self-pay | Admitting: Cardiology

## 2012-04-14 ENCOUNTER — Ambulatory Visit: Payer: BC Managed Care – PPO | Admitting: Cardiology

## 2012-04-14 VITALS — BP 110/72 | HR 67 | Ht 63.0 in | Wt 187.0 lb

## 2012-04-14 DIAGNOSIS — I2510 Atherosclerotic heart disease of native coronary artery without angina pectoris: Secondary | ICD-10-CM

## 2012-04-14 DIAGNOSIS — I10 Essential (primary) hypertension: Secondary | ICD-10-CM

## 2012-04-14 DIAGNOSIS — E782 Mixed hyperlipidemia: Secondary | ICD-10-CM

## 2012-04-14 NOTE — Assessment & Plan Note (Signed)
Continue medical therapy for small vessel, branch vessel disease. She does have intermittent angina. ECG is normal today. Do not anticipate any further cardiac testing prior to planned spine surgery with Dr. Channing Mutters. Followup is arranged.

## 2012-04-14 NOTE — Patient Instructions (Addendum)

## 2012-04-14 NOTE — Assessment & Plan Note (Signed)
Continue followup with Dr. Sherryll Burger.

## 2012-04-14 NOTE — Assessment & Plan Note (Signed)
Blood pressure is normal today. No change to current regimen. 

## 2012-04-14 NOTE — Assessment & Plan Note (Signed)
Goal LDL should at least be under 100. I discussed rationale for medical therapy in her case, however she does not want to try any medications at this point for lipid reduction. Last LDL was 133 in December. She has been off Lipitor for several months.

## 2012-04-14 NOTE — Progress Notes (Signed)
Clinical Summary Natalie Tanner is a 52 y.o.female presenting for followup. She was seen in April of 2013. She reports intermittent chest pain symptoms, combination of anxiety and also intermittent angina based on description. She takes nitroglycerin only occasionally, continues on the medications outlined below. Followup ECG today is normal.  Lab work from December showed AST 16, ALT 13, triglycerides 149, cholesterol 209, HDL 46, LDL 133. This represented an improvement in LDL. She does tell me that she has not taken Lipitor for several months, felt like the medicine was not necessary, and she was trying to simplify her regimen on her own. I did discuss with her the rationale for aggressive lipid management in the setting of CAD and diabetes. At this point she did not want to start any cholesterol lowering medications.  She states that she will be having removal of a cyst from her spine by Dr. Channing Tanner under general anesthesia. She is in fact holding aspirin now in anticipation of this procedure. No recent escalation in cardiac symptoms.  No Known Allergies  Current Outpatient Prescriptions  Medication Sig Dispense Refill  . ALPRAZolam (XANAX) 1 MG tablet Take 1 mg by mouth at bedtime as needed.        . isosorbide mononitrate (IMDUR) 30 MG 24 hr tablet Take 1 tablet (30 mg total) by mouth 2 (two) times daily.  60 tablet  6  . lisinopril (PRINIVIL,ZESTRIL) 40 MG tablet Take 40 mg by mouth daily.      . metFORMIN (GLUCOPHAGE) 1000 MG tablet Take 1,000 mg by mouth 2 (two) times daily with a meal.        . metoprolol (LOPRESSOR) 50 MG tablet Take 50 mg by mouth 2 (two) times daily.        Marland Kitchen NITROSTAT 0.4 MG SL tablet DISSOLVE 1 TABLET UNDER TONGUE EVERY 5 MINUTES FOR UP TO 3 DOSES AS NEEDED  25 each  3  . omeprazole (PRILOSEC OTC) 20 MG tablet Take 20 mg by mouth daily.        Marland Kitchen PARoxetine (PAXIL) 20 MG tablet Take 20 mg by mouth every morning.        Marland Kitchen aspirin 81 MG tablet Take 81 mg by mouth daily.          No current facility-administered medications for this visit.    Past Medical History  Diagnosis Date  . Coronary atherosclerosis of native coronary artery     Managed medically following cardiac catheterization 5/12  . Type 2 diabetes mellitus   . Essential hypertension, benign   . Mixed hyperlipidemia   . Iron deficiency anemia     Secondary to menometrorrhagia  . Nephrolithiasis   . Depression   . GERD (gastroesophageal reflux disease)   . Myocardial infarction     NSTEMI 5/12    Social History Natalie Tanner reports that she quit smoking about 11 years ago. Her smoking use included Cigarettes. She has a 20 pack-year smoking history. She has never used smokeless tobacco. Natalie Tanner reports that she does not drink alcohol.  Review of Systems No palpitations, dizziness, syncope. No cardiac hospitalizations.  Physical Examination Filed Vitals:   04/14/12 0918  BP: 110/72  Pulse: 67   Filed Weights   04/14/12 0918  Weight: 187 lb (84.823 kg)   No acute distress.  HEENT: Conjunctiva and lids normal, oropharynx clear.  Neck: Supple, no elevated JVP or carotid bruits, no thyromegaly.  Lungs: Clear to auscultation, nontender.  Cardiac: Regular rate and rhythm, no S3  or rub.  Lungs: Clear to auscultation, nontender.  Abdomen: Soft, nontender, bowel sounds present.  Extremities: No pitting edema, distal pulses full.    Problem List and Plan   Coronary atherosclerosis of native coronary artery Continue medical therapy for small vessel, branch vessel disease. She does have intermittent angina. ECG is normal today. Do not anticipate any further cardiac testing prior to planned spine surgery with Dr. Channing Tanner. Followup is arranged.  Mixed hyperlipidemia Goal LDL should at least be under 100. I discussed rationale for medical therapy in her case, however she does not want to try any medications at this point for lipid reduction. Last LDL was 133 in December. She has been off  Lipitor for several months.  Essential hypertension, benign Blood pressure is normal today. No change to current regimen.  Type II or unspecified type diabetes mellitus without mention of complication, uncontrolled Continue followup with Dr. Sherryll Tanner.    Natalie Tanner, M.D., F.A.C.C.

## 2012-09-07 ENCOUNTER — Other Ambulatory Visit: Payer: Self-pay | Admitting: Cardiology

## 2012-09-07 NOTE — Telephone Encounter (Signed)
Medication sent via escribe.  

## 2012-12-11 ENCOUNTER — Other Ambulatory Visit: Payer: Self-pay | Admitting: Nurse Practitioner

## 2013-02-03 ENCOUNTER — Encounter: Payer: Self-pay | Admitting: Cardiology

## 2014-04-27 ENCOUNTER — Telehealth: Payer: Self-pay | Admitting: Cardiology

## 2014-04-27 NOTE — Telephone Encounter (Signed)
Numerous attempts to contact patient by recall letters. No response  Buford Dresser [[id number]] 11/24/2012 12:15 PM Notification Sent [20] Lynnda Child Slaughter [[id number]] 03/30/2013 3:20 PM Notification Sent [20] Lynnda Child Slaughter [[id number]] 06/24/2013 11:25 AM Notification Sent [20] Chanda Busing [[id number]] 08/12/2013 9:55 AM Notification Sent [20] Chanda Busing [[id number]] 10/06/2013 11:35 AM Notification Sent [20]

## 2015-03-05 DIAGNOSIS — I639 Cerebral infarction, unspecified: Secondary | ICD-10-CM

## 2015-03-05 HISTORY — DX: Cerebral infarction, unspecified: I63.9

## 2015-06-15 ENCOUNTER — Emergency Department (HOSPITAL_COMMUNITY)
Admission: EM | Admit: 2015-06-15 | Discharge: 2015-06-15 | Disposition: A | Discharge status: Home / Self Care | Attending: Emergency Medicine | Admitting: Emergency Medicine

## 2015-06-15 ENCOUNTER — Encounter (HOSPITAL_COMMUNITY): Payer: Self-pay

## 2015-06-15 DIAGNOSIS — H6982 Other specified disorders of Eustachian tube, left ear: Secondary | ICD-10-CM

## 2015-06-15 DIAGNOSIS — Z87891 Personal history of nicotine dependence: Secondary | ICD-10-CM | POA: Diagnosis not present

## 2015-06-15 DIAGNOSIS — R0981 Nasal congestion: Secondary | ICD-10-CM | POA: Diagnosis not present

## 2015-06-15 DIAGNOSIS — F329 Major depressive disorder, single episode, unspecified: Secondary | ICD-10-CM | POA: Diagnosis not present

## 2015-06-15 DIAGNOSIS — E119 Type 2 diabetes mellitus without complications: Secondary | ICD-10-CM | POA: Insufficient documentation

## 2015-06-15 DIAGNOSIS — H6992 Unspecified Eustachian tube disorder, left ear: Secondary | ICD-10-CM | POA: Diagnosis not present

## 2015-06-15 DIAGNOSIS — I10 Essential (primary) hypertension: Secondary | ICD-10-CM | POA: Insufficient documentation

## 2015-06-15 DIAGNOSIS — I252 Old myocardial infarction: Secondary | ICD-10-CM | POA: Diagnosis not present

## 2015-06-15 DIAGNOSIS — E782 Mixed hyperlipidemia: Secondary | ICD-10-CM | POA: Diagnosis not present

## 2015-06-15 DIAGNOSIS — R1110 Vomiting, unspecified: Secondary | ICD-10-CM | POA: Diagnosis present

## 2015-06-15 LAB — CBG MONITORING, ED: Glucose-Capillary: 304 mg/dL — ABNORMAL HIGH (ref 65–99)

## 2015-06-15 MED ORDER — NAPROXEN 500 MG PO TABS: 500.0000 mg | ORAL_TABLET | Freq: Two times a day (BID) | ORAL | Status: DC

## 2015-06-15 NOTE — ED Notes (Signed)
Pt says has been having problems with left ear being stopped up and dizziness since Sunday.  Denies pain.

## 2015-06-15 NOTE — ED Provider Notes (Signed)
CSN: [id number]     Arrival date & time 06/15/15  1825 History   First MD Initiated Contact with Patient 06/15/15 1841     Chief Complaint  Patient presents with  . ear stopped up    . Emesis     (Consider location/radiation/quality/duration/timing/severity/associated sxs/prior Treatment) Patient is a 55 y.o. female presenting with vomiting. The history is provided by the patient.  Emesis Associated symptoms: no abdominal pain and no headaches    patient with a history of similar problems in the past. Had an episode in November episode in March and then this times been lasting for about 4 days. Left ear discomfort and pain and feeling of sinus pressure and congestion. Patient had a balloon procedure done on her sinuses in the fall by Dr. Redmond Pulling who is no longer in practice she states. Patient states that she's been taking decongestants to help it has not helped. She does have a history of hypertension. Denies fevers associated with some dizziness but no vertigo. No other focal neuro findings. Right ear is not involved.  Past Medical History  Diagnosis Date  . Coronary atherosclerosis of native coronary artery     Managed medically following cardiac catheterization 5/12  . Type 2 diabetes mellitus ([place])   . Essential hypertension, benign   . Mixed hyperlipidemia   . Iron deficiency anemia     Secondary to menometrorrhagia  . Nephrolithiasis   . Depression   . GERD (gastroesophageal reflux disease)   . Myocardial infarction [facility])     NSTEMI 5/12   Past Surgical History  Procedure Laterality Date  . Knee surgery     Family History  Problem Relation Age of Onset  . Other Father     Clostridium difficile   Social History  Substance Use Topics  . Smoking status: Former Smoker -- 1.00 packs/day for 20 years    Types: Cigarettes    Quit date: 03/04/2001  . Smokeless tobacco: Never Used  . Alcohol Use: No   OB History    No data available     Review of Systems   Constitutional: Negative for fever.  HENT: Positive for congestion, ear pain and sinus pressure.   Eyes: Negative for redness.  Respiratory: Negative for shortness of breath.   Cardiovascular: Negative for chest pain.  Gastrointestinal: Negative for nausea, vomiting and abdominal pain.  Genitourinary: Negative for dysuria.  Musculoskeletal: Negative for neck stiffness.  Skin: Negative for rash.  Neurological: Positive for dizziness. Negative for syncope, speech difficulty, weakness, numbness and headaches.  Hematological: Does not bruise/bleed easily.      Allergies  Review of patient's allergies indicates no known allergies.  Home Medications   Prior to Admission medications   Medication Sig Start Date End Date Taking? Authorizing Provider  ALPRAZolam Duanne Moron) 1 MG tablet Take 1 mg by mouth at bedtime as needed.      Historical Provider, MD  aspirin 81 MG tablet Take 81 mg by mouth daily.      Historical Provider, MD  isosorbide mononitrate (IMDUR) 30 MG 24 hr tablet TAKE ONE TABLET BY MOUTH TWICE DAILY. 09/07/12   Satira Sark, MD  lisinopril (PRINIVIL,ZESTRIL) 40 MG tablet Take 40 mg by mouth daily.    Historical Provider, MD  metFORMIN (GLUCOPHAGE) 1000 MG tablet Take 1,000 mg by mouth 2 (two) times daily with a meal.      Historical Provider, MD  metoprolol (LOPRESSOR) 50 MG tablet Take 50 mg by mouth 2 (two) times daily.  Historical Provider, MD  naproxen (NAPROSYN) 500 MG tablet Take 1 tablet (500 mg total) by mouth 2 (two) times daily. 06/15/15   Fredia Sorrow, MD  NITROSTAT 0.4 MG SL tablet DISSOLVE ONE TABLET UNDER THE TONGUE EVERY 5 MINUTES FOR UP TO 3 DOSES AS NEEDED 12/11/12   Minna Merritts, MD  omeprazole (PRILOSEC OTC) 20 MG tablet Take 20 mg by mouth daily.      Historical Provider, MD  PARoxetine (PAXIL) 20 MG tablet Take 20 mg by mouth every morning.      Historical Provider, MD   BP 205/109 mmHg  Pulse 88  Temp(Src) 97.6 F (36.4 C) (Oral)  Resp 18   Ht 5\' 2"  (1.575 m)  Wt 84.823 kg  BMI 34.19 kg/m2  SpO2 100%  LMP  Physical Exam  Constitutional: She is oriented to person, place, and time. She appears well-developed and well-nourished. No distress.  HENT:  Head: Normocephalic and atraumatic.  Right Ear: External ear normal.  Left Ear: External ear normal.  Mouth/Throat: Oropharynx is clear and moist. No oropharyngeal exudate.  Eyes: Conjunctivae and EOM are normal. Pupils are equal, round, and reactive to light.  Neck: Normal range of motion. Neck supple.  Cardiovascular: Normal rate, regular rhythm and normal heart sounds.   Pulmonary/Chest: Effort normal and breath sounds normal. No respiratory distress.  Abdominal: Soft. Bowel sounds are normal. There is no tenderness.  Musculoskeletal: Normal range of motion.  Neurological: She is alert and oriented to person, place, and time. No cranial nerve deficit. She exhibits normal muscle tone. Coordination normal.  Skin: Skin is warm. No rash noted.  Nursing note and vitals reviewed.   ED Course  Procedures (including critical care time) Labs Review Labs Reviewed - No data to display  Imaging Review No results found. I have personally reviewed and evaluated these images and lab results as part of my medical decision-making.   EKG Interpretation None      MDM   Final diagnoses:  Eustachian tube dysfunction, left  Sinus congestion    Patient symptoms seem to be consistent with the sinus congestion patient states she had a balloon procedure done in September. And also left eustachian tube dysfunction. Patient unable take decongestants due to her hypertension. She has been doing that cautioned her against that. We'll refer to ear nose and throat for additional follow-up. Will treat with Naprosyn for the discomfort and also have her start taking a seasonal allergy antihistamine like Zyrtec. Patient nontoxic no acute distress.    Fredia Sorrow, MD 06/15/15 Einar Crow

## 2015-06-15 NOTE — Discharge Instructions (Signed)
Follow up with ear nose and throat. Referral information provided call for an appointment. Take the Naprosyn on a regular basis. Recommend taking an allergy medicine like Zrytec on a regular basis. Avoid anything with decongestants in it due to your high blood pressure. Return for any new or worse symptoms.

## 2015-06-26 DIAGNOSIS — H9193 Unspecified hearing loss, bilateral: Secondary | ICD-10-CM | POA: Insufficient documentation

## 2015-06-26 DIAGNOSIS — R42 Dizziness and giddiness: Secondary | ICD-10-CM | POA: Insufficient documentation

## 2015-08-21 ENCOUNTER — Encounter: Payer: Self-pay | Admitting: Physician Assistant

## 2015-09-21 ENCOUNTER — Encounter: Payer: Self-pay | Admitting: Neurology

## 2015-09-21 ENCOUNTER — Ambulatory Visit (INDEPENDENT_AMBULATORY_CARE_PROVIDER_SITE_OTHER): Admitting: Neurology

## 2015-09-21 VITALS — BP 116/62 | HR 78 | Resp 16 | Ht 63.0 in | Wt 171.0 lb

## 2015-09-21 DIAGNOSIS — E0842 Diabetes mellitus due to underlying condition with diabetic polyneuropathy: Secondary | ICD-10-CM | POA: Diagnosis not present

## 2015-09-21 DIAGNOSIS — E1165 Type 2 diabetes mellitus with hyperglycemia: Secondary | ICD-10-CM | POA: Diagnosis not present

## 2015-09-21 DIAGNOSIS — I639 Cerebral infarction, unspecified: Secondary | ICD-10-CM

## 2015-09-21 DIAGNOSIS — I6381 Other cerebral infarction due to occlusion or stenosis of small artery: Secondary | ICD-10-CM

## 2015-09-21 DIAGNOSIS — I214 Non-ST elevation (NSTEMI) myocardial infarction: Secondary | ICD-10-CM | POA: Diagnosis not present

## 2015-09-21 DIAGNOSIS — E669 Obesity, unspecified: Secondary | ICD-10-CM

## 2015-09-21 DIAGNOSIS — R0683 Snoring: Secondary | ICD-10-CM

## 2015-09-21 NOTE — Patient Instructions (Signed)
Please ask Dr. Redmond Pulling about sleep study testing. I would recommend this, as OSA. Based on your symptoms and your exam I believe you are at risk for obstructive sleep apnea or OSA. Please remember, the risks and ramifications of moderate to severe obstructive sleep apnea or OSA are: Cardiovascular disease, including congestive heart failure, stroke, difficult to control hypertension, arrhythmias, and even type 2 diabetes has been linked to untreated OSA. Sleep apnea causes disruption of sleep and sleep deprivation in most cases, which, in turn, can cause recurrent headaches, problems with memory, mood, concentration, focus, and vigilance. Most people with untreated sleep apnea report excessive daytime sleepiness, which can affect their ability to drive. Please do not drive if you feel sleepy.   Continue exercising regularly and take your medications as directed. As discussed, secondary prevention is key after a stroke. This means: taking care of blood sugar values or diabetes management, good blood pressure (hypertension) control and optimizing cholesterol management, exercising daily or regularly within your own mobility limitations of course, and overall cardiovascular risk factor reduction, which includes screening for and treatment of obstructive sleep apnea (OSA) and weight management.   Please drink more water. Please talk to your PCP about: cholesterol management, your LDL should be less than 70, and seeing an endocrinologist, your A1c should be less than 7.  I have ordered an echocardiogram and carotid doppler ultrasound. We will call you with the results.

## 2015-09-21 NOTE — Progress Notes (Signed)
Subjective:    Patient ID: Natalie Tanner is a 55 y.o. female.  HPI     Star Age, MD, PhD [facility] [address] [place], [place] [address]  Dear Dr. Redmond Pulling,   I saw your patient, Natalie Tanner, upon your kind request in my neurologic clinic today for initial consultation of her abnormal MRI brain, concern for prior Stroke. The patient is accompanied by her husband today. As you know, Natalie Tanner is a 55 year old right-handed woman with an underlying medical history of depression, reflux disease, coronary artery disease, status post non-STEMI in May 2012, type 2 diabetes, hypertension, hyperlipidemia, kidney stones, history of hearing loss and labyrinthitis for which you have been seeing her, and history of obesity, who reports L hearing loss in the L ear and vertigo in March. She was treated with 2 rounds of prednisone, after which she improved. In the beginning when symptoms started she felt like she was going to fall and was using a cane. She has been able to walk without a cane and had no recent fall. You ordered a brain MRI. She had a recent brain MRI with and without contrast at [facility] on 08/24/2015 and I reviewed the results: Impression: Subtle asymmetric enhancement of the left seventh nerve mastoid segment of unclear significance, and might be physiologic. Otherwise normal bilateral IAC imaging. There is trace mastoid fluid on the left which appears inconsequential but could be postinflammatory in nature. Evidence of left greater than right basal ganglia chronic small vessel ischemia, new finding since 2013. Otherwise negative MRI appearance of the brain. Report includes chronic lacunar in the left caudate. I reviewed your office note from 08/18/2015, which you kindly included. In October 2016 she had a short bout of vertigo. They were on a cruise at the time. She recalls no symptoms of TIA such as one-sided weakness, numbness,  tingling, slurring of speech or droopy face with the exception of some right hand weakness intermittently, perhaps in April of this year. She has a family history of stroke. Her on stroke risk factors include heart disease, hypertension, hyperlipidemia, type 2 diabetes which is unfortunately not well controlled. She reports her last A1c was 13. She had blood work in June with her primary care physician. She was treated with generic Lipitor in the past but could not tolerate it. Currently she is not on any statin. She takes baby aspirin but sometimes admits that she forgets it. Of note, she snores and reports occasional morning headaches. She has nocturia. She does not always wake up rested. She has been trying to lose weight and lost several pounds but has gained some back, especially after the prednisone. She does not drink much in the way of caffeine, quit smoking in 2003. Her random blood sugar values at home range between 200-400.  Her Past Medical History Is Significant For: Past Medical History  Diagnosis Date  . Coronary atherosclerosis of native coronary artery     Managed medically following cardiac catheterization 5/12  . Type 2 diabetes mellitus ([place])   . Essential hypertension, benign   . Mixed hyperlipidemia   . Iron deficiency anemia     Secondary to menometrorrhagia  . Nephrolithiasis   . Depression   . GERD (gastroesophageal reflux disease)   . Myocardial infarction [facility])     NSTEMI 5/12  . Anxiety     Her Past Surgical History Is Significant For: Past Surgical History  Procedure Laterality Date  . Knee surgery    .  Sinus surgery with instatrak    . Back surgery      Her Family History Is Significant For: Family History  Problem Relation Age of Onset  . Other Father     Clostridium difficile  . Stroke Father     Her Social History Is Significant For: Social History   Social History  . Marital Status: Married    Spouse Name: N/A  . Number of Children: 2  .  Years of Education: 12   Occupational History  . [employer]    Social History Main Topics  . Smoking status: Former Smoker -- 1.00 packs/day for 20 years    Types: Cigarettes    Quit date: 03/04/2001  . Smokeless tobacco: Never Used  . Alcohol Use: No  . Drug Use: No  . Sexual Activity: Not on file   Other Topics Concern  . Not on file   Social History Narrative   Drinks about 2L of soda a day     Her Allergies Are:  No Known Allergies:   Her Current Medications Are:  Outpatient Encounter Prescriptions as of 09/21/2015  Medication Sig  . ALPRAZolam (XANAX) 1 MG tablet Take 1 mg by mouth at bedtime as needed.    Marland Kitchen aspirin 81 MG tablet Take 81 mg by mouth daily.    Marland Kitchen glipiZIDE (GLUCOTROL) 10 MG tablet Take 10 mg by mouth daily.  . isosorbide mononitrate (IMDUR) 30 MG 24 hr tablet TAKE ONE TABLET BY MOUTH TWICE DAILY.  Marland Kitchen lisinopril (PRINIVIL,ZESTRIL) 40 MG tablet Take 40 mg by mouth daily.  . metoprolol (LOPRESSOR) 50 MG tablet Take 50 mg by mouth 2 (two) times daily.    Marland Kitchen NITROSTAT 0.4 MG SL tablet DISSOLVE ONE TABLET UNDER THE TONGUE EVERY 5 MINUTES FOR UP TO 3 DOSES AS NEEDED  . omeprazole (PRILOSEC OTC) 20 MG tablet Take 20 mg by mouth daily.    Marland Kitchen PARoxetine (PAXIL) 20 MG tablet Take 20 mg by mouth every morning.    . sitaGLIPtin (JANUVIA) 100 MG tablet Take 100 mg by mouth daily.  . [DISCONTINUED] metFORMIN (GLUCOPHAGE) 1000 MG tablet Take 1,000 mg by mouth 2 (two) times daily with a meal.    . [DISCONTINUED] naproxen (NAPROSYN) 500 MG tablet Take 1 tablet (500 mg total) by mouth 2 (two) times daily.   No facility-administered encounter medications on file as of 09/21/2015.  : Review of Systems:  Out of a complete 14 point review of systems, all are reviewed and negative with the exception of these symptoms as listed below:   Review of Systems  Neurological:       Patient reports that she had sudden onset of L ear deadness, dizziness, L eye pulling sensation, L eye  blurred vision. Most of symptoms have "somewhat" resolved.     Objective:  Neurologic Exam  Physical Exam Physical Examination:   Filed Vitals:   09/21/15 1139  BP: 116/62  Pulse: 78  Resp: 16   General Examination: The patient is a very pleasant 55 y.o. female in no acute distress. She appears well-developed and well-nourished and well groomed.   HEENT: Normocephalic, atraumatic, pupils are equal, round and reactive to light and accommodation. Funduscopic exam is normal with sharp disc margins noted. Extraocular tracking is good without limitation to gaze excursion or nystagmus noted. Normal smooth pursuit is noted. Hearing is grossly intact. Tympanic membranes are clear bilaterally. Face is symmetric with normal facial animation and normal facial sensation. Speech is clear with no dysarthria noted.  There is no hypophonia. There is no lip, neck/head, jaw or voice tremor. Neck is supple with full range of passive and active motion. There are no carotid bruits on auscultation. Oropharynx exam reveals: mild mouth dryness, adequate dental hygiene and mild airway crowding, due to larger tongue and smaller airway. Mallampati is class II. Tongue protrudes centrally and palate elevates symmetrically. Tonsils are small. Neck size is 15 3/8 inches.   Chest: Clear to auscultation without wheezing, rhonchi or crackles noted.  Heart: S1+S2+0, regular and normal without murmurs, rubs or gallops noted.   Abdomen: Soft, non-tender and non-distended with normal bowel sounds appreciated on auscultation.  Extremities: There is no pitting edema in the distal lower extremities bilaterally. Pedal pulses are intact.  Skin: Warm and dry without trophic changes noted. There are no varicose veins.  Musculoskeletal: exam reveals no obvious joint deformities, tenderness or joint swelling or erythema.   Neurologically:  Mental status: The patient is awake, alert and oriented in all 4 spheres. Her immediate and  remote memory, attention, language skills and fund of knowledge are appropriate. There is no evidence of aphasia, agnosia, apraxia or anomia. Speech is clear with normal prosody and enunciation. Thought process is linear. Mood is normal and affect is normal.  Cranial nerves II - XII are as described above under HEENT exam. In addition: shoulder shrug is normal with equal shoulder height noted. Motor exam: Normal bulk, strength and tone is noted. There is no drift, tremor or rebound. Romberg is negative. Reflexes are 1+ in the UEs and trace in the LEs. Babinski: Toes are flexor bilaterally. Fine motor skills and coordination: intact with normal finger taps, normal hand movements, normal rapid alternating patting, normal foot taps and normal foot agility.  Cerebellar testing: No dysmetria or intention tremor on finger to nose testing. Heel to shin is unremarkable bilaterally. There is no truncal or gait ataxia.  Sensory exam: intact to light touch, pinprick, vibration, temperature sense in the upper extremities but she does have mild decrease in pinprick and temperature sense in the distal lower extremities up to mid shin area.  Gait, station and balance: She stands easily. No veering to one side is noted. No leaning to one side is noted. Posture is age-appropriate and stance is narrow based. Gait shows normal stride length and normal pace. No problems turning are noted. Tandem walk is slightly challenging in the beginning. No ataxia, no vertigo symptoms, no lightheadedness are reported.   Assessment and Plan:   In summary, Natalie Tanner is a very pleasant 55 y.o.-year old female with an underlying medical history of depression, reflux disease, coronary artery disease, status post non-STEMI in May 2012, type 2 diabetes, hypertension, hyperlipidemia, kidney stones, history of hearing loss and labyrinthitis for which you have been seeing her, and history of obesity, who presents with a history of lacunar stroke  in the left caudate and white matter changes in the deep white matter bilaterally, mostly in the basal ganglia areas. She has several stroke risk factors. She has currently a nonfocal exam and we talked at length about secondary stroke prevention and her risk factor modification at this time. Unfortunately, she does not have optimal glucose control at this time for her diabetes and also evidence of mild diabetic neuropathy, thankfully without any evidence of pain. She appears to be at risk for obstructive sleep apnea and is encouraged to talk to about sleep study testing. She is furthermore advised to be completely compliant with her baby aspirin and pursue  weight loss. In addition, she may need cholesterol management, we will request blood test results from her primary care physician's office as she had blood work last month. She may benefit from seeing an endocrinologist as well. I will complete her TIA/stroke workup with a carotid ultrasound and echocardiogram which I will order. She has not been seeing cardiology recently. I will see her back routinely in about 3 months and we will keep her posted as to her carotid duplex results and her echocardiogram results via phone call. I answered all her questions today and the patient and her husband were in agreement.  Thank you very much for allowing me to participate in the care of this nice patient. If I can be of any further assistance to you please do not hesitate to call me at [phone].  Sincerely,   Star Age, MD, PhD

## 2015-09-26 ENCOUNTER — Telehealth: Payer: Self-pay | Admitting: Neurology

## 2015-09-26 NOTE — Telephone Encounter (Signed)
Hi Natalie Tanner Patient is ready to be scheduled for her doppler at [facility] NO PA needed. Thanks Hinton Dyer .

## 2015-10-11 ENCOUNTER — Ambulatory Visit (INDEPENDENT_AMBULATORY_CARE_PROVIDER_SITE_OTHER)

## 2015-10-11 DIAGNOSIS — I6381 Other cerebral infarction due to occlusion or stenosis of small artery: Secondary | ICD-10-CM

## 2015-10-11 DIAGNOSIS — E1165 Type 2 diabetes mellitus with hyperglycemia: Secondary | ICD-10-CM

## 2015-10-11 DIAGNOSIS — I639 Cerebral infarction, unspecified: Secondary | ICD-10-CM | POA: Diagnosis not present

## 2015-10-11 DIAGNOSIS — E669 Obesity, unspecified: Secondary | ICD-10-CM

## 2015-10-11 DIAGNOSIS — R0683 Snoring: Secondary | ICD-10-CM

## 2015-10-11 DIAGNOSIS — I214 Non-ST elevation (NSTEMI) myocardial infarction: Secondary | ICD-10-CM

## 2015-10-11 DIAGNOSIS — E0842 Diabetes mellitus due to underlying condition with diabetic polyneuropathy: Secondary | ICD-10-CM

## 2015-10-19 ENCOUNTER — Telehealth: Payer: Self-pay | Admitting: Neurology

## 2015-10-19 NOTE — Telephone Encounter (Signed)
I reviewed her carotid Doppler report from 10/11/2015. Impression: This study is negative for hemodynamically significant stenosis involving the extracranial carotid arteries bilaterally.  Please call patient to advise that her carotid Doppler ultrasound test of her neck arteries was unremarkable.

## 2015-10-19 NOTE — Telephone Encounter (Signed)
Called and spoke to pt husband, Sonia Side about results per Dr Rexene Alberts. He verbalized understanding and will let pt know.

## 2015-12-11 ENCOUNTER — Ambulatory Visit: Admitting: Neurology

## 2015-12-11 ENCOUNTER — Telehealth: Payer: Self-pay

## 2015-12-11 NOTE — Telephone Encounter (Signed)
Patient cancelled appt same day due to being sick.

## 2015-12-12 ENCOUNTER — Encounter: Payer: Self-pay | Admitting: Neurology

## 2016-02-21 ENCOUNTER — Encounter: Payer: Self-pay | Admitting: Physician Assistant

## 2016-02-21 ENCOUNTER — Ambulatory Visit (INDEPENDENT_AMBULATORY_CARE_PROVIDER_SITE_OTHER): Admitting: Physician Assistant

## 2016-02-21 VITALS — BP 139/89 | HR 73 | Temp 97.7°F | Ht 63.0 in | Wt 179.0 lb

## 2016-02-21 DIAGNOSIS — H8302 Labyrinthitis, left ear: Secondary | ICD-10-CM | POA: Insufficient documentation

## 2016-02-21 DIAGNOSIS — R42 Dizziness and giddiness: Secondary | ICD-10-CM | POA: Diagnosis not present

## 2016-02-21 DIAGNOSIS — E119 Type 2 diabetes mellitus without complications: Secondary | ICD-10-CM | POA: Diagnosis not present

## 2016-02-21 DIAGNOSIS — I2510 Atherosclerotic heart disease of native coronary artery without angina pectoris: Secondary | ICD-10-CM | POA: Diagnosis not present

## 2016-02-21 DIAGNOSIS — I10 Essential (primary) hypertension: Secondary | ICD-10-CM | POA: Diagnosis not present

## 2016-02-21 DIAGNOSIS — H8392 Unspecified disease of left inner ear: Secondary | ICD-10-CM

## 2016-02-21 LAB — BAYER DCA HB A1C WAIVED: HB A1C: 10.6 % — AB (ref <7.0)

## 2016-02-21 MED ORDER — MECLIZINE HCL 25 MG PO TABS: 25.0000 mg | ORAL_TABLET | Freq: Three times a day (TID) | ORAL | 0 refills | Status: DC | PRN

## 2016-02-21 MED ORDER — METOPROLOL TARTRATE 50 MG PO TABS: 50.0000 mg | ORAL_TABLET | Freq: Two times a day (BID) | ORAL | 3 refills | Status: DC

## 2016-02-21 MED ORDER — PAROXETINE HCL 20 MG PO TABS: 20.0000 mg | ORAL_TABLET | ORAL | 3 refills | Status: DC

## 2016-02-21 MED ORDER — ISOSORBIDE MONONITRATE ER 30 MG PO TB24: 30.0000 mg | ORAL_TABLET | Freq: Two times a day (BID) | ORAL | 3 refills | Status: DC

## 2016-02-21 MED ORDER — ALPRAZOLAM 1 MG PO TABS: 1.0000 mg | ORAL_TABLET | Freq: Two times a day (BID) | ORAL | 1 refills | Status: DC | PRN

## 2016-02-21 MED ORDER — OMEPRAZOLE MAGNESIUM 20 MG PO TBEC: 20.0000 mg | DELAYED_RELEASE_TABLET | Freq: Every day | ORAL | 3 refills | Status: DC

## 2016-02-21 MED ORDER — LISINOPRIL 40 MG PO TABS
40.0000 mg | ORAL_TABLET | Freq: Every day | ORAL | 3 refills | Status: DC
Start: 2016-02-21 — End: 2017-03-14

## 2016-02-21 MED ORDER — NITROGLYCERIN 0.4 MG SL SUBL: 0.4000 mg | SUBLINGUAL_TABLET | SUBLINGUAL | 2 refills | Status: DC | PRN

## 2016-02-21 NOTE — Patient Instructions (Signed)
DASH Eating Plan DASH stands for "Dietary Approaches to Stop Hypertension." The DASH eating plan is a healthy eating plan that has been shown to reduce high blood pressure (hypertension). Additional health benefits may include reducing the risk of type 2 diabetes mellitus, heart disease, and stroke. The DASH eating plan may also help with weight loss. What do I need to know about the DASH eating plan? For the DASH eating plan, you will follow these general guidelines:  Choose foods with less than 150 milligrams of sodium per serving (as listed on the food label).  Use salt-free seasonings or herbs instead of table salt or sea salt.  Check with your health care provider or pharmacist before using salt substitutes.  Eat lower-sodium products. These are often labeled as "low-sodium" or "no salt added."  Eat fresh foods. Avoid eating a lot of canned foods.  Eat more vegetables, fruits, and low-fat dairy products.  Choose whole grains. Look for the word "whole" as the first word in the ingredient list.  Choose fish and skinless chicken or turkey more often than red meat. Limit fish, poultry, and meat to 6 oz (170 g) each day.  Limit sweets, desserts, sugars, and sugary drinks.  Choose heart-healthy fats.  Eat more home-cooked food and less restaurant, buffet, and fast food.  Limit fried foods.  Do not fry foods. Cook foods using methods such as baking, boiling, grilling, and broiling instead.  When eating at a restaurant, ask that your food be prepared with less salt, or no salt if possible. What foods can I eat? Seek help from a dietitian for individual calorie needs. Grains  Whole grain or whole wheat bread. Brown rice. Whole grain or whole wheat pasta. Quinoa, bulgur, and whole grain cereals. Low-sodium cereals. Corn or whole wheat flour tortillas. Whole grain cornbread. Whole grain crackers. Low-sodium crackers. Vegetables  Fresh or frozen vegetables (raw, steamed, roasted, or  grilled). Low-sodium or reduced-sodium tomato and vegetable juices. Low-sodium or reduced-sodium tomato sauce and paste. Low-sodium or reduced-sodium canned vegetables. Fruits  All fresh, canned (in natural juice), or frozen fruits. Meat and Other Protein Products  Ground beef (85% or leaner), grass-fed beef, or beef trimmed of fat. Skinless chicken or turkey. Ground chicken or turkey. Pork trimmed of fat. All fish and seafood. Eggs. Dried beans, peas, or lentils. Unsalted nuts and seeds. Unsalted canned beans. Dairy  Low-fat dairy products, such as skim or 1% milk, 2% or reduced-fat cheeses, low-fat ricotta or cottage cheese, or plain low-fat yogurt. Low-sodium or reduced-sodium cheeses. Fats and Oils  Tub margarines without trans fats. Light or reduced-fat mayonnaise and salad dressings (reduced sodium). Avocado. Safflower, olive, or canola oils. Natural peanut or almond butter. Other  Unsalted popcorn and pretzels. The items listed above may not be a complete list of recommended foods or beverages. Contact your dietitian for more options.  What foods are not recommended? Grains  White bread. White pasta. White rice. Refined cornbread. Bagels and croissants. Crackers that contain trans fat. Vegetables  Creamed or fried vegetables. Vegetables in a cheese sauce. Regular canned vegetables. Regular canned tomato sauce and paste. Regular tomato and vegetable juices. Fruits  Canned fruit in light or heavy syrup. Fruit juice. Meat and Other Protein Products  Fatty cuts of meat. Ribs, chicken wings, bacon, sausage, bologna, salami, chitterlings, fatback, hot dogs, bratwurst, and packaged luncheon meats. Salted nuts and seeds. Canned beans with salt. Dairy  Whole or 2% milk, cream, half-and-half, and cream cheese. Whole-fat or sweetened yogurt. Full-fat cheeses   or blue cheese. Nondairy creamers and whipped toppings. Processed cheese, cheese spreads, or cheese curds. Condiments  Onion and garlic  salt, seasoned salt, table salt, and sea salt. Canned and packaged gravies. Worcestershire sauce. Tartar sauce. Barbecue sauce. Teriyaki sauce. Soy sauce, including reduced sodium. Steak sauce. Fish sauce. Oyster sauce. Cocktail sauce. Horseradish. Ketchup and mustard. Meat flavorings and tenderizers. Bouillon cubes. Hot sauce. Tabasco sauce. Marinades. Taco seasonings. Relishes. Fats and Oils  Butter, stick margarine, lard, shortening, ghee, and bacon fat. Coconut, palm kernel, or palm oils. Regular salad dressings. Other  Pickles and olives. Salted popcorn and pretzels. The items listed above may not be a complete list of foods and beverages to avoid. Contact your dietitian for more information.  Where can I find more information? National Heart, Lung, and [employer] Institute: www.nhlbi.nih.gov/health/health-topics/topics/dash/ This information is not intended to replace advice given to you by your health care provider. Make sure you discuss any questions you have with your health care provider. Document Released: 02/07/2011 Document Revised: 07/27/2015 Document Reviewed: 12/23/2012 Elsevier Interactive Patient Education  2017 [employer]  

## 2016-02-21 NOTE — Progress Notes (Signed)
BP 139/89   Pulse 73   Temp 97.7 F (36.5 C) (Oral)   Ht _0  (1.6 m)   Wt 179 lb (81.2 kg)   BMI 31.71 kg/m    Subjective:    Patient ID: Natalie Tanner, female    DOB: [birth year], 55 y.o.   MRN: [id number]  Natalie Tanner is a 54 y.o. female presenting on 02/21/2016 for Dizziness (When she lays down and turns side to side ) and Establish Care (Patient was seen by you 1 time at [facility])  HPI Patient here to be established as new patient at [facility].  This patient is known to me from [facility]. This patient comes in for periodic recheck on medications and conditions. All medications are reviewed today. There are no reports of any problems with the medications. All of the medical conditions are reviewed and updated.  Lab work is reviewed and will be ordered as medically necessary.     Past Medical History:  Diagnosis Date  . Anxiety   . Coronary atherosclerosis of native coronary artery    Managed medically following cardiac catheterization 5/12  . Depression   . Essential hypertension, benign   . GERD (gastroesophageal reflux disease)   . Iron deficiency anemia    Secondary to menometrorrhagia  . Mixed hyperlipidemia   . Myocardial infarction    NSTEMI 5/12  . Nephrolithiasis   . Type 2 diabetes mellitus (HCC)    Relevant past medical, surgical, family and social history reviewed and updated as indicated. Interim medical history since our last visit reviewed. Allergies and medications reviewed and updated.   Data reviewed from any sources in EPIC.  Review of Systems  Constitutional: Negative.  Negative for activity change, fatigue and fever.  HENT: Positive for congestion and ear pain. Negative for ear discharge.   Eyes: Negative.   Respiratory: Negative.  Negative for cough, shortness of breath and wheezing.   Cardiovascular: Positive for chest pain. Negative for palpitations and leg swelling.  Gastrointestinal: Negative.   Negative for abdominal pain.  Endocrine: Negative.   Genitourinary: Negative.  Negative for dysuria.  Musculoskeletal: Negative.   Skin: Negative.   Neurological: Positive for dizziness and light-headedness.     Social History   Social History  . Marital status: Married    Spouse name: N/A  . Number of children: 2  . Years of education: 12   Occupational History  . [employer]    Social History Main Topics  . Smoking status: Former Smoker    Packs/day: 1.00    Years: 20.00    Types: Cigarettes    Quit date: 03/04/2001  . Smokeless tobacco: Never Used  . Alcohol use No  . Drug use: No  . Sexual activity: Not on file   Other Topics Concern  . Not on file   Social History Narrative   Drinks about 2L of soda a day     Past Surgical History:  Procedure Laterality Date  . BACK SURGERY    . KNEE SURGERY    . SINUS SURGERY WITH INSTATRAK      Family History  Problem Relation Age of Onset  . Other Father     Clostridium difficile  . Stroke Father     Allergies as of 02/21/2016   No Known Allergies     Medication List       Accurate as of 02/21/16 10:31 AM. Always use your most recent med list.  ALPRAZolam 1 MG tablet Commonly known as:  XANAX Take 1 tablet (1 mg total) by mouth 2 (two) times daily as needed.   aspirin 81 MG tablet Take 81 mg by mouth daily.   glipiZIDE 10 MG tablet Commonly known as:  GLUCOTROL Take 10 mg by mouth daily.   isosorbide mononitrate 30 MG 24 hr tablet Commonly known as:  IMDUR Take 1 tablet (30 mg total) by mouth 2 (two) times daily.   lisinopril 40 MG tablet Commonly known as:  PRINIVIL,ZESTRIL Take 1 tablet (40 mg total) by mouth daily.   meclizine 25 MG tablet Commonly known as:  ANTIVERT Take 1 tablet (25 mg total) by mouth 3 (three) times daily as needed for dizziness.   metoprolol 50 MG tablet Commonly known as:  LOPRESSOR Take 1 tablet (50 mg total) by mouth 2 (two) times daily.   nitroGLYCERIN  0.4 MG SL tablet Commonly known as:  NITROSTAT Place 1 tablet (0.4 mg total) under the tongue every 5 (five) minutes as needed for chest pain.   omeprazole 20 MG tablet Commonly known as:  PRILOSEC OTC Take 1 tablet (20 mg total) by mouth daily.   PARoxetine 20 MG tablet Commonly known as:  PAXIL Take 1 tablet (20 mg total) by mouth every morning.   sitaGLIPtin 100 MG tablet Commonly known as:  JANUVIA Take 100 mg by mouth daily.          Objective:    BP 139/89   Pulse 73   Temp 97.7 F (36.5 C) (Oral)   Ht _0  (1.6 m)   Wt 179 lb (81.2 kg)   BMI 31.71 kg/m   No Known Allergies Wt Readings from Last 3 Encounters:  02/21/16 179 lb (81.2 kg)  09/21/15 171 lb (77.6 kg)  06/15/15 187 lb (84.8 kg)    Physical Exam  Constitutional: She is oriented to person, place, and time. She appears well-developed and well-nourished.  HENT:  Head: Normocephalic and atraumatic.  Right Ear: Tympanic membrane, external ear and ear canal normal.  Left Ear: Tympanic membrane, external ear and ear canal normal.  Nose: Nose normal. No rhinorrhea.  Mouth/Throat: Oropharynx is clear and moist and mucous membranes are normal. No oropharyngeal exudate or posterior oropharyngeal erythema.  Eyes: Conjunctivae and EOM are normal. Pupils are equal, round, and reactive to light.  Neck: Normal range of motion. Neck supple.  Cardiovascular: Normal rate, regular rhythm, normal heart sounds and intact distal pulses.   Pulmonary/Chest: Effort normal and breath sounds normal.  Abdominal: Soft. Bowel sounds are normal.  Neurological: She is alert and oriented to person, place, and time. She has normal reflexes.  Skin: Skin is warm and dry. No rash noted.  Psychiatric: She has a normal mood and affect. Her behavior is normal. Judgment and thought content normal.    Results for orders placed or performed during the hospital encounter of 06/15/15  POC CBG, ED  Result Value Ref Range    Glucose-Capillary 304 (H) 65 - 99 mg/dL      Assessment & Plan:   1. Labyrinthitis of left ear - meclizine (ANTIVERT) 25 MG tablet; Take 1 tablet (25 mg total) by mouth 3 (three) times daily as needed for dizziness.  Dispense: 60 tablet; Refill: 0  2. Dizziness - meclizine (ANTIVERT) 25 MG tablet; Take 1 tablet (25 mg total) by mouth 3 (three) times daily as needed for dizziness.  Dispense: 60 tablet; Refill: 0  3. Inner ear disease, left - meclizine (ANTIVERT)  25 MG tablet; Take 1 tablet (25 mg total) by mouth 3 (three) times daily as needed for dizziness.  Dispense: 60 tablet; Refill: 0  4. Essential hypertension, benign - Lipid panel - CBC with Differential/Platelet - CMP14+EGFR - Ambulatory referral to Cardiology  5. Atherosclerosis of native coronary artery of native heart without angina pectoris - Lipid panel - CBC with Differential/Platelet - CMP14+EGFR - Ambulatory referral to Cardiology - Microalbumin / creatinine urine ratio  6. Type 2 diabetes mellitus without complication, without long-term current use of insulin (HCC) - Bayer DCA Hb A1c Waived - Ambulatory referral to Cardiology - Microalbumin / creatinine urine ratio   Continue all other maintenance medications as listed above. Educational handout given for DASH diet  Follow up plan: Return in about 3 months (around 05/21/2016) for recheck.  Terald Sleeper PA-C [place] [address]  [place], [place] [address] [phone]   02/21/2016, 10:31 AM

## 2016-02-22 LAB — CMP14+EGFR
ALBUMIN: 3.7 g/dL (ref 3.5–5.5)
ALK PHOS: 69 IU/L (ref 39–117)
ALT: 29 IU/L (ref 0–32)
AST: 24 IU/L (ref 0–40)
Albumin/Globulin Ratio: 1.4 (ref 1.2–2.2)
BILIRUBIN TOTAL: 0.5 mg/dL (ref 0.0–1.2)
BUN / CREAT RATIO: 6 — AB (ref 9–23)
BUN: 5 mg/dL — AB (ref 6–24)
CHLORIDE: 97 mmol/L (ref 96–106)
CO2: 23 mmol/L (ref 18–29)
Calcium: 9.2 mg/dL (ref 8.7–10.2)
Creatinine, Ser: 0.9 mg/dL (ref 0.57–1.00)
GFR calc non Af Amer: 73 mL/min/{1.73_m2} (ref >59)
GFR, EST AFRICAN AMERICAN: 84 mL/min/{1.73_m2} (ref >59)
GLOBULIN, TOTAL: 2.6 g/dL (ref 1.5–4.5)
Glucose: 244 mg/dL — ABNORMAL HIGH (ref 65–99)
Potassium: 4 mmol/L (ref 3.5–5.2)
SODIUM: 139 mmol/L (ref 134–144)
TOTAL PROTEIN: 6.3 g/dL (ref 6.0–8.5)

## 2016-02-22 LAB — CBC WITH DIFFERENTIAL/PLATELET
BASOS ABS: 0 10*3/uL (ref 0.0–0.2)
BASOS: 1 %
EOS (ABSOLUTE): 0.2 10*3/uL (ref 0.0–0.4)
EOS: 3 %
HEMATOCRIT: 39.7 % (ref 34.0–46.6)
HEMOGLOBIN: 14.2 g/dL (ref 11.1–15.9)
IMMATURE GRANS (ABS): 0 10*3/uL (ref 0.0–0.1)
Immature Granulocytes: 0 %
LYMPHS ABS: 1.5 10*3/uL (ref 0.7–3.1)
LYMPHS: 25 %
MCH: 32 pg (ref 26.6–33.0)
MCHC: 35.8 g/dL — AB (ref 31.5–35.7)
MCV: 89 fL (ref 79–97)
MONOCYTES: 4 %
Monocytes Absolute: 0.3 10*3/uL (ref 0.1–0.9)
NEUTROS ABS: 3.9 10*3/uL (ref 1.4–7.0)
Neutrophils: 67 %
Platelets: 226 10*3/uL (ref 150–379)
RBC: 4.44 x10E6/uL (ref 3.77–5.28)
RDW: 14.3 % (ref 12.3–15.4)
WBC: 6 10*3/uL (ref 3.4–10.8)

## 2016-02-22 LAB — MICROALBUMIN / CREATININE URINE RATIO
Creatinine, Urine: 148.6 mg/dL
Microalb/Creat Ratio: 29.8 mg/g creat (ref 0.0–30.0)
Microalbumin, Urine: 44.3 ug/mL

## 2016-02-22 LAB — LIPID PANEL
CHOL/HDL RATIO: 6.7 ratio — AB (ref 0.0–4.4)
CHOLESTEROL TOTAL: 207 mg/dL — AB (ref 100–199)
HDL: 31 mg/dL — AB (ref >39)
LDL Calculated: 133 mg/dL — ABNORMAL HIGH (ref 0–99)
TRIGLYCERIDES: 214 mg/dL — AB (ref 0–149)
VLDL Cholesterol Cal: 43 mg/dL — ABNORMAL HIGH (ref 5–40)

## 2016-03-07 ENCOUNTER — Telehealth: Payer: Self-pay | Admitting: Physician Assistant

## 2016-03-07 MED ORDER — PRAVASTATIN SODIUM 40 MG PO TABS: 40.0000 mg | ORAL_TABLET | Freq: Every day | ORAL | 3 refills | Status: DC

## 2016-03-07 NOTE — Telephone Encounter (Signed)
Prescription sent to pharmacy.

## 2016-03-07 NOTE — Telephone Encounter (Signed)
-----   Message from Shelbie Ammons, LPN sent at [phone]  4:59 PM EST ----- Aware of all lab results .She will need a new script for her lipids,(has not been on medicine in a long time, send to [facility]).

## 2016-03-14 ENCOUNTER — Encounter: Payer: Self-pay | Admitting: Pharmacist

## 2016-03-14 ENCOUNTER — Ambulatory Visit (INDEPENDENT_AMBULATORY_CARE_PROVIDER_SITE_OTHER): Admitting: Pharmacist

## 2016-03-14 VITALS — BP 110/70 | HR 72 | Ht 63.0 in | Wt 180.0 lb

## 2016-03-14 DIAGNOSIS — E119 Type 2 diabetes mellitus without complications: Secondary | ICD-10-CM

## 2016-03-14 DIAGNOSIS — E6609 Other obesity due to excess calories: Secondary | ICD-10-CM | POA: Diagnosis not present

## 2016-03-14 DIAGNOSIS — Z6831 Body mass index (BMI) 31.0-31.9, adult: Secondary | ICD-10-CM | POA: Diagnosis not present

## 2016-03-14 DIAGNOSIS — E782 Mixed hyperlipidemia: Secondary | ICD-10-CM

## 2016-03-14 MED ORDER — EMPAGLIFLOZIN 25 MG PO TABS: 25.0000 mg | ORAL_TABLET | Freq: Every day | ORAL | 1 refills | Status: DC

## 2016-03-14 MED ORDER — PRAVASTATIN SODIUM 40 MG PO TABS: 40.0000 mg | ORAL_TABLET | Freq: Every day | ORAL | 1 refills | Status: DC

## 2016-03-14 NOTE — Patient Instructions (Addendum)
I have given you a sample of Jardiance 38m - take 1 tablet daily.  You will received Jardiance 235min mail from [facility] Take 1 tablet daily.   Diabetes and Standards of Medical Care   Diabetes is complicated. You may find that your diabetes team includes a dietitian, nurse, diabetes educator, eye doctor, and more. To help everyone know what is going on and to help you get the care you deserve, the following schedule of care was developed to help keep you on track. Below are the tests, exams, vaccines, medicines, education, and plans you will need.  Blood Glucose Goals Prior to meals = 80 - 130 Within 2 hours of the start of a meal = less than 180  HbA1c test (goal is less than 7.0% - your last value was 10.6 %) This test shows how well you have controlled your glucose over the past 2 to 3 months. It is used to see if your diabetes management plan needs to be adjusted.   It is performed at least 2 times a year if you are meeting treatment goals.  It is performed 4 times a year if therapy has changed or if you are not meeting treatment goals.  Blood pressure test  This test is performed at every routine medical visit. The goal is less than 140/90 mmHg for most people, but 130/80 mmHg in some cases. Ask your health care provider about your goal.  Dental exam  Follow up with the dentist regularly.  Eye exam  If you are diagnosed with type 1 diabetes as a child, get an exam upon reaching the age of 1063ears or older and have had diabetes for 3 to 5 years. Yearly eye exams are recommended after that initial eye exam.  If you are diagnosed with type 1 diabetes as an adult, get an exam within 5 years of diagnosis and then yearly.  If you are diagnosed with type 2 diabetes, get an exam as soon as possible after the diagnosis and then yearly.  Foot care exam  Visual foot exams are performed at every routine medical visit. The exams check for cuts, injuries, or other problems with the  feet.  A comprehensive foot exam should be done yearly. This includes visual inspection as well as assessing foot pulses and testing for loss of sensation.  Check your feet nightly for cuts, injuries, or other problems with your feet. Tell your health care provider if anything is not healing.  Kidney function test (urine microalbumin)  This test is performed once a year.  Type 1 diabetes: The first test is performed 5 years after diagnosis.  Type 2 diabetes: The first test is performed at the time of diagnosis.  A serum creatinine and estimated glomerular filtration rate (eGFR) test is done once a year to assess the level of chronic kidney disease (CKD), if present.  Lipid profile (cholesterol, HDL, LDL, triglycerides)  Performed every 5 years for most people.  The goal for LDL is less than 100 mg/dL. If you are at high risk, the goal is less than 70 mg/dL.  The goal for HDL is 40 mg/dL to 50 mg/dL for men and 50 mg/dL to 60 mg/dL for women. An HDL cholesterol of 60 mg/dL or higher gives some protection against heart disease.  The goal for triglycerides is less than 150 mg/dL.  Influenza vaccine, pneumococcal vaccine, and hepatitis B vaccine  The influenza vaccine is recommended yearly.  The pneumococcal vaccine is generally given once  in a lifetime. However, there are some instances when another vaccination is recommended. Check with your health care provider.  The hepatitis B vaccine is also recommended for adults with diabetes.  Diabetes self-management education  Education is recommended at diagnosis and ongoing as needed.  Treatment plan  Your treatment plan is reviewed at every medical visit.  Document Released: 12/16/2008 Document Revised: 10/21/2012 Document Reviewed: 07/21/2012 [facility] Patient Information 2014 [place].

## 2016-03-14 NOTE — Progress Notes (Signed)
Subjective:    Natalie Tanner is a 56 y.o. female who presents for an initial evaluation of Type 2 diabetes mellitus.  Current symptoms/problems include hyperglycemia and have been improving. Symptoms have been present for 3 months.  The patient was initially diagnosed with Type 2 diabetes mellitus over 5 years ago. Her most recent A1c was 10.6% which had improved from 13% per patient (I do not currently have records from previous PCP office). She is currently taking glipizide 10mg  qd and Januvia 100mg  qd.  She has tried metformin in the past both 1000mg  bid and 500mg  bid and experienced GI discomfort and diarrhea at both doses.  Per labs Natalie Tanner planned for patient to start Jardiance but she has not received Rx from mail order pharmacy yet.   Known diabetic complications: cardiovascular disease Cardiovascular risk factors: advanced age (older than 36 for men, 37 for women), diabetes mellitus, dyslipidemia, obesity (BMI >= 30 kg/m2) and sedentary lifestyle Eye exam current (within one year): no Weight trend: stable Prior visit with CDE: no Current diet: in general, an "unhealthy" diet; reports drinking regular Sprite and likes to have lifesavers between meals. Current exercise: none and she does swim and walk in her pool during the summer Medication Compliance?  Yes -  Patient was suppose to restart pravastatin but she has not received new Rx for this yet  Current monitoring regimen: home blood tests - checks BG once daily at varying times of day. Home blood sugar records: ranges from 193 to 346 Any episodes of hypoglycemia? no  Is She on ACE inhibitor or angiotensin II receptor blocker?  Yes  lisinopril (Prinivil)    The following portions of the patient's history were reviewed and updated as appropriate: allergies, current medications, past family history, past medical history, past social history, past surgical history and problem list.    Objective:    BP 110/70   Pulse 72    Ht 5\' 3"  (1.6 m)   Wt 180 lb (81.6 kg)   BMI 31.89 kg/m   Lab Review Glucose (mg/dL)  Date Value  02/21/2016 244 (H)   Glucose, Bld (mg/dL)  Date Value  07/25/2010 236 (H)  07/24/2010 208 (H)  07/23/2010 227 (H)   CO2  Date Value  02/21/2016 23 mmol/L  07/25/2010 25 mEq/L  07/24/2010 26 mEq/L   BUN (mg/dL)  Date Value  02/21/2016 5 (L)  07/25/2010 8  07/24/2010 10  07/23/2010 8   Creatinine, Ser (mg/dL)  Date Value  02/21/2016 0.90  07/25/2010 0.68  07/24/2010 0.67   A1c = 10.6% (02/03/2016)    Diabetic Foot Form - Detailed   Diabetic Foot Exam - detailed Diabetic Foot exam was performed with the following findings:  Yes 03/14/2016 12:45 PM  Visual Foot Exam completed.:  Yes  Is there a history of foot ulcer?:  No Can the patient see the bottom of their feet?:  Yes Are the shoes appropriate in style and fit?:  Yes Is there swelling or and abnormal foot shape?:  No Are the toenails long?:  Yes Are the toenails thick?:  No Do you have pain in calf while walking?:  No Is there a claw toe deformity?:  No Is there elevated skin temparature?:  No Is there limited skin dorsiflexion?:  No Is there foot or ankle muscle weakness?:  No Are the toenails ingrown?:  No Normal Range of Motion:  Yes Pulse Foot Exam completed.:  Yes  Right posterior Tibialias:  Present Left posterior Tibialias:  Present  Right Dorsalis Pedis:  Present Left Dorsalis Pedis:  Present  Sensory Foot Exam Completed.:  Yes Swelling:  No Semmes-Weinstein Monofilament Test R Foot Test Control:  Pos L Foot Test Control:  Pos  R Site 1-Great Toe:  Pos L Site 1-Great Toe:  Pos  R Site 4:  Pos L Site 4:  Pos  R Site 5:  Pos L Site 5:  Pos          Assessment:    Diabetes Mellitus type II, under inadequate control.   Hyperlipidemia, mixed - LDL goal is less than 70; Tg goal less than 150 Obesity HTN - BP at goal today   Plan:    1.  Rx changes: Rx sent in for Jardiance 25mg  take 1  tablet daily (patient was given #7 of 10mg  until mail order arrives)   Restart pravastatin 40mg  take 1 tablet daily 2.  Education: Reviewed 'ABCs' of diabetes management (respective goals in parentheses):  A1C (<7), blood pressure (<130/80), and cholesterol (LDL <100). 3. Discussed health maintence - recommended colonoscopy, mammogram and eye exam.  Patient declined referrals today.  Also discussed importance of lnfluenza and pneumonia vaccines - patient declined.  4. CHO counting diet discussed.  Reviewed CHO amount in various foods and how to read nutrition labels.  Discussed recommended serving sizes.  Discussed alternatives to soda for beverage choice. 5.  Recommend check BG 1  times a day 6.  Recommended increase physical activity.  Recommended she join gym or [employer].  Start with 10 to 15 minutes and work up to goal of 150 minutes per week 7. Follow up: 3 months

## 2016-05-07 LAB — HM DIABETES EYE EXAM

## 2016-05-10 ENCOUNTER — Other Ambulatory Visit: Payer: Self-pay | Admitting: Physician Assistant

## 2016-05-10 MED ORDER — GLIPIZIDE 10 MG PO TABS
10.0000 mg | ORAL_TABLET | Freq: Every day | ORAL | 0 refills | Status: DC
Start: 2016-05-10 — End: 2016-05-23

## 2016-05-10 MED ORDER — SITAGLIPTIN PHOSPHATE 100 MG PO TABS: 100.0000 mg | ORAL_TABLET | Freq: Every day | ORAL | 0 refills | Status: DC

## 2016-05-10 NOTE — Telephone Encounter (Signed)
done

## 2016-05-23 ENCOUNTER — Encounter: Payer: Self-pay | Admitting: Physician Assistant

## 2016-05-23 ENCOUNTER — Ambulatory Visit (INDEPENDENT_AMBULATORY_CARE_PROVIDER_SITE_OTHER): Admitting: Physician Assistant

## 2016-05-23 VITALS — BP 111/77 | HR 82 | Temp 96.9°F | Ht 63.0 in | Wt 172.8 lb

## 2016-05-23 DIAGNOSIS — H8302 Labyrinthitis, left ear: Secondary | ICD-10-CM

## 2016-05-23 DIAGNOSIS — R42 Dizziness and giddiness: Secondary | ICD-10-CM | POA: Diagnosis not present

## 2016-05-23 DIAGNOSIS — E119 Type 2 diabetes mellitus without complications: Secondary | ICD-10-CM

## 2016-05-23 DIAGNOSIS — H8392 Unspecified disease of left inner ear: Secondary | ICD-10-CM | POA: Diagnosis not present

## 2016-05-23 DIAGNOSIS — E782 Mixed hyperlipidemia: Secondary | ICD-10-CM | POA: Diagnosis not present

## 2016-05-23 LAB — BAYER DCA HB A1C WAIVED: HB A1C (BAYER DCA - WAIVED): 9.2 % — ABNORMAL HIGH (ref <7.0)

## 2016-05-23 MED ORDER — GLIPIZIDE 10 MG PO TABS: 10.0000 mg | ORAL_TABLET | Freq: Every day | ORAL | 3 refills | Status: DC

## 2016-05-23 MED ORDER — SITAGLIPTIN PHOSPHATE 100 MG PO TABS: 100.0000 mg | ORAL_TABLET | Freq: Every day | ORAL | 3 refills | Status: DC

## 2016-05-23 MED ORDER — MECLIZINE HCL 25 MG PO TABS: 25.0000 mg | ORAL_TABLET | Freq: Three times a day (TID) | ORAL | 11 refills | Status: DC | PRN

## 2016-05-23 MED ORDER — EMPAGLIFLOZIN 25 MG PO TABS: 25.0000 mg | ORAL_TABLET | Freq: Every day | ORAL | 3 refills | Status: DC

## 2016-05-23 NOTE — Patient Instructions (Signed)
300-400 calories, 20 grams protein

## 2016-05-23 NOTE — Progress Notes (Signed)
BP 111/77   Pulse 82   Temp (!) 96.9 F (36.1 C) (Oral)   Ht 5' 3"  (1.6 m)   Wt 172 lb 12.8 oz (78.4 kg)   BMI 30.61 kg/m    Subjective:    Patient ID: Natalie Tanner, female    DOB: 1960-07-07, 56 y.o.   MRN: [id number]  HPI: Cristen Bredeson is a 56 y.o. 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 Last month patient came and saw the diabetic educator and got a lot of information. She is trying hard to do better. She did have some sugars in the 100s in the past couple months and is excited about that. She has had a mixup with her pharmacy getting medication to her. We have sent the medication for some time and do not expect a gap in her therapy again. She will have blood work done today to check her A1c and lipid. She still continues to have chronic vertigo on a nearly daily basis. Some years ago she was seen by ENT and MRI did show a area of possible stroke. She was seen by neurologist once but no significant follow-up was ever made. We will consider vestibular therapy for this chronic vertigo. The patient is interested in going..   All medications are reviewed today. There are no reports of any problems with the medications. All of the medical conditions are reviewed and updated.  Lab work is reviewed and will be ordered as medically necessary. There are no new problems reported with today's visit.   Relevant past medical, surgical, family and social history reviewed and updated as indicated. Allergies and medications reviewed and updated.  Past Medical History:  Diagnosis Date  . Anxiety   . Coronary atherosclerosis of native coronary artery    Managed medically following cardiac catheterization 5/12  . Depression   . Essential hypertension, benign   .  GERD (gastroesophageal reflux disease)   . Iron deficiency anemia    Secondary to menometrorrhagia  . Mixed hyperlipidemia   . Myocardial infarction    NSTEMI 5/12  . Nephrolithiasis   . Type 2 diabetes mellitus ([place])     Past Surgical History:  Procedure Laterality Date  . BACK SURGERY    . KNEE SURGERY    . SINUS SURGERY WITH INSTATRAK      Review of Systems  Constitutional: Negative.  Negative for activity change, fatigue and fever.  HENT: Negative.   Eyes: Negative.   Respiratory: Negative.  Negative for cough.   Cardiovascular: Negative.  Negative for chest pain.  Gastrointestinal: Negative.  Negative for abdominal pain.  Endocrine: Negative.   Genitourinary: Negative.  Negative for dysuria.  Musculoskeletal: Negative.   Skin: Negative.   Neurological: Positive for dizziness and light-headedness. Negative for tremors, syncope, weakness and numbness.    Allergies as of 05/23/2016   No Known Allergies     Medication List       Accurate as of 05/23/16  4:58 PM. Always use your most recent med list.          ALPRAZolam 1 MG tablet Commonly known as:  XANAX Take 1 tablet (1 mg total) by mouth 2 (two) times daily as needed.   aspirin 81 MG tablet Take 81 mg by mouth daily.   empagliflozin 25 MG Tabs tablet Commonly known as:  JARDIANCE Take 25 mg by mouth daily.   glipiZIDE 10 MG tablet Commonly known as:  GLUCOTROL Take 1 tablet (10 mg total) by mouth daily.   isosorbide mononitrate 30 MG 24 hr tablet Commonly known as:  IMDUR Take 1 tablet (30 mg total) by mouth 2 (two) times daily.   lisinopril 40 MG tablet Commonly known as:  PRINIVIL,ZESTRIL Take 1 tablet (40 mg total) by mouth daily.   meclizine 25 MG tablet Commonly known as:  ANTIVERT Take 1 tablet (25 mg total) by mouth 3 (three) times daily as needed for dizziness.   metoprolol 50 MG tablet Commonly known as:  LOPRESSOR Take 1 tablet (50 mg total) by mouth 2 (two) times daily.     nitroGLYCERIN 0.4 MG SL tablet Commonly known as:  NITROSTAT Place 1 tablet (0.4 mg total) under the tongue every 5 (five) minutes as needed for chest pain.   omeprazole 20 MG tablet Commonly known as:  PRILOSEC OTC Take 1 tablet (20 mg total) by mouth daily.   PARoxetine 20 MG tablet Commonly known as:  PAXIL Take 1 tablet (20 mg total) by mouth every morning.   pravastatin 40 MG tablet Commonly known as:  PRAVACHOL Take 1 tablet (40 mg total) by mouth daily.   sitaGLIPtin 100 MG tablet Commonly known as:  JANUVIA Take 1 tablet (100 mg total) by mouth daily.          Objective:    BP 111/77   Pulse 82   Temp (!) 96.9 F (36.1 C) (Oral)   Ht 5' 3"  (1.6 m)   Wt 172 lb 12.8 oz (78.4 kg)   BMI 30.61 kg/m   No Known Allergies  Physical Exam  Constitutional: She is oriented to person, place, and time. She appears well-developed and well-nourished.  HENT:  Head: Normocephalic and atraumatic.  Right Ear: Tympanic membrane, external ear and ear canal normal.  Left Ear: Tympanic membrane, external ear and ear canal normal.  Nose: Nose normal. No rhinorrhea.  Mouth/Throat: Oropharynx is clear and moist and mucous membranes are normal. No oropharyngeal exudate or posterior oropharyngeal erythema.  Eyes: Conjunctivae and EOM are normal. Pupils are equal, round, and reactive to light.  Neck: Normal range of motion. Neck supple.  Cardiovascular: Normal rate, regular rhythm, normal heart sounds and intact distal pulses.   Pulmonary/Chest: Effort normal and breath sounds normal.  Abdominal: Soft. Bowel sounds are normal.  Neurological: She is alert and oriented to person, place, and time. She has normal reflexes.  Skin: Skin is warm and dry. No rash noted.  Psychiatric: She has a normal mood and affect. Her behavior is normal. Judgment and thought content normal.        Assessment & Plan:   1. Labyrinthitis of left ear Plan referral to PT for vestibular therapy -  meclizine (ANTIVERT) 25 MG tablet; Take 1 tablet (25 mg total) by mouth 3 (three) times daily as needed for dizziness.  Dispense: 90 tablet; Refill: 11  2. Dizziness - meclizine (ANTIVERT) 25 MG tablet; Take 1 tablet (25 mg total) by mouth 3 (three) times daily as needed for dizziness.  Dispense: 90 tablet; Refill: 11  3. Inner ear disease, left - meclizine (ANTIVERT) 25 MG tablet; Take 1 tablet (25 mg total) by mouth 3 (three) times daily as needed for dizziness.  Dispense: 90 tablet; Refill: 11  4. Chronic vertigo Complaining referral to PT for vestibular therapy  5. Mixed hyperlipidemia - CMP14+EGFR - Lipid panel  6. Type  2 diabetes mellitus without complication, without long-term current use of insulin (HCC) - glipiZIDE (GLUCOTROL) 10 MG tablet; Take 1 tablet (10 mg total) by mouth daily.  Dispense: 90 tablet; Refill: 3 - sitaGLIPtin (JANUVIA) 100 MG tablet; Take 1 tablet (100 mg total) by mouth daily.  Dispense: 90 tablet; Refill: 3 - empagliflozin (JARDIANCE) 25 MG TABS tablet; Take 25 mg by mouth daily.  Dispense: 90 tablet; Refill: 3 - CMP14+EGFR - Bayer DCA Hb A1c Waived   Current Outpatient Prescriptions:  .  ALPRAZolam (XANAX) 1 MG tablet, Take 1 tablet (1 mg total) by mouth 2 (two) times daily as needed., Disp: 180 tablet, Rfl: 1 .  empagliflozin (JARDIANCE) 25 MG TABS tablet, Take 25 mg by mouth daily., Disp: 90 tablet, Rfl: 3 .  glipiZIDE (GLUCOTROL) 10 MG tablet, Take 1 tablet (10 mg total) by mouth daily., Disp: 90 tablet, Rfl: 3 .  isosorbide mononitrate (IMDUR) 30 MG 24 hr tablet, Take 1 tablet (30 mg total) by mouth 2 (two) times daily., Disp: 180 tablet, Rfl: 3 .  lisinopril (PRINIVIL,ZESTRIL) 40 MG tablet, Take 1 tablet (40 mg total) by mouth daily., Disp: 90 tablet, Rfl: 3 .  meclizine (ANTIVERT) 25 MG tablet, Take 1 tablet (25 mg total) by mouth 3 (three) times daily as needed for dizziness., Disp: 90 tablet, Rfl: 11 .  metoprolol (LOPRESSOR) 50 MG tablet, Take 1  tablet (50 mg total) by mouth 2 (two) times daily., Disp: 180 tablet, Rfl: 3 .  nitroGLYCERIN (NITROSTAT) 0.4 MG SL tablet, Place 1 tablet (0.4 mg total) under the tongue every 5 (five) minutes as needed for chest pain., Disp: 25 tablet, Rfl: 2 .  omeprazole (PRILOSEC OTC) 20 MG tablet, Take 1 tablet (20 mg total) by mouth daily., Disp: 90 tablet, Rfl: 3 .  PARoxetine (PAXIL) 20 MG tablet, Take 1 tablet (20 mg total) by mouth every morning., Disp: 90 tablet, Rfl: 3 .  pravastatin (PRAVACHOL) 40 MG tablet, Take 1 tablet (40 mg total) by mouth daily., Disp: 90 tablet, Rfl: 1 .  sitaGLIPtin (JANUVIA) 100 MG tablet, Take 1 tablet (100 mg total) by mouth daily., Disp: 90 tablet, Rfl: 3 .  aspirin 81 MG tablet, Take 81 mg by mouth daily.  , Disp: , Rfl:   Continue all other maintenance medications as listed above.  Follow up plan: Return in about 3 months (around 08/23/2016) for recheck.  Educational handout given for diet information  Terald Sleeper PA-C [facility] [address]  [place], [place] [address] [phone]   05/23/2016, 4:58 PM

## 2016-05-24 LAB — CMP14+EGFR
A/G RATIO: 1.6 (ref 1.2–2.2)
ALBUMIN: 4.6 g/dL (ref 3.5–5.5)
ALK PHOS: 69 IU/L (ref 39–117)
ALT: 23 IU/L (ref 0–32)
AST: 24 IU/L (ref 0–40)
BILIRUBIN TOTAL: 1.4 mg/dL — AB (ref 0.0–1.2)
BUN / CREAT RATIO: 14 (ref 9–23)
BUN: 16 mg/dL (ref 6–24)
CHLORIDE: 91 mmol/L — AB (ref 96–106)
CO2: 21 mmol/L (ref 18–29)
Calcium: 10.5 mg/dL — ABNORMAL HIGH (ref 8.7–10.2)
Creatinine, Ser: 1.12 mg/dL — ABNORMAL HIGH (ref 0.57–1.00)
GFR calc non Af Amer: 55 mL/min/{1.73_m2} — ABNORMAL LOW (ref >59)
GFR, EST AFRICAN AMERICAN: 64 mL/min/{1.73_m2} (ref >59)
GLUCOSE: 280 mg/dL — AB (ref 65–99)
Globulin, Total: 2.9 g/dL (ref 1.5–4.5)
POTASSIUM: 4.5 mmol/L (ref 3.5–5.2)
SODIUM: 132 mmol/L — AB (ref 134–144)
TOTAL PROTEIN: 7.5 g/dL (ref 6.0–8.5)

## 2016-05-24 LAB — LIPID PANEL
CHOLESTEROL TOTAL: 182 mg/dL (ref 100–199)
Chol/HDL Ratio: 5.1 ratio units — ABNORMAL HIGH (ref 0.0–4.4)
HDL: 36 mg/dL — ABNORMAL LOW (ref >39)
LDL Calculated: 100 mg/dL — ABNORMAL HIGH (ref 0–99)
Triglycerides: 228 mg/dL — ABNORMAL HIGH (ref 0–149)
VLDL CHOLESTEROL CAL: 46 mg/dL — AB (ref 5–40)

## 2016-08-26 ENCOUNTER — Encounter: Payer: Self-pay | Admitting: Physician Assistant

## 2016-08-26 ENCOUNTER — Ambulatory Visit (INDEPENDENT_AMBULATORY_CARE_PROVIDER_SITE_OTHER): Admitting: Physician Assistant

## 2016-08-26 VITALS — BP 92/68 | HR 74 | Temp 97.2°F | Ht 63.0 in | Wt 179.2 lb

## 2016-08-26 DIAGNOSIS — E119 Type 2 diabetes mellitus without complications: Secondary | ICD-10-CM | POA: Diagnosis not present

## 2016-08-26 DIAGNOSIS — M25541 Pain in joints of right hand: Secondary | ICD-10-CM | POA: Diagnosis not present

## 2016-08-26 DIAGNOSIS — E782 Mixed hyperlipidemia: Secondary | ICD-10-CM | POA: Diagnosis not present

## 2016-08-26 DIAGNOSIS — I10 Essential (primary) hypertension: Secondary | ICD-10-CM

## 2016-08-26 DIAGNOSIS — M25542 Pain in joints of left hand: Secondary | ICD-10-CM

## 2016-08-26 LAB — BAYER DCA HB A1C WAIVED: HB A1C: 10 % — AB (ref <7.0)

## 2016-08-26 NOTE — Patient Instructions (Signed)
Rheumatoid Arthritis Rheumatoid arthritis (RA) is a long-term (chronic) disease. RA causes inflammation in your joints. Your joints may feel painful, stiff, swollen, warm, or tender. RA may start slowly. Usually, it affects the small joints of the hands and feet. It can also affect other parts of the body, even the heart, eyes, or lungs. Symptoms of RA often come and go. Sometimes, symptoms get worse for a while. These are called flares. There is no cure for RA, but your doctor will work with you to find the best treatment option for you. This will depend on how the disease is changing in your body. Follow these instructions at home:  Take over-the-counter and prescription medicines only as told by your doctor. Your doctor may change (adjust) your medicines every 3 months.  Start an exercise program as told by your doctor.  Rest when you have a flare.  Return to your normal activities as told by your doctor. Ask your doctor what activities are safe for you.  Keep all follow-up visits as told by your doctor. This is important. Contact a doctor if:  You have a flare.  You have a fever.  You have problems (side effects) because of your medicines. Get help right away if:  You have chest pain.  You have trouble breathing.  You have a hot, painful joint all of a sudden, and it is worse than your usual joint aches. This information is not intended to replace advice given to you by your health care provider. Make sure you discuss any questions you have with your health care provider. Document Released: 05/13/2011 Document Revised: 07/27/2015 Document Reviewed: 12/01/2014 Elsevier Interactive Patient Education  2018 [employer] Inc.  

## 2016-08-27 LAB — MICROALBUMIN / CREATININE URINE RATIO
Creatinine, Urine: 76.2 mg/dL
MICROALB/CREAT RATIO: 16 mg/g{creat} (ref 0.0–30.0)
Microalbumin, Urine: 12.2 ug/mL

## 2016-08-27 LAB — ARTHRITIS PANEL
BASOS: 1 %
Basophils Absolute: 0 10*3/uL (ref 0.0–0.2)
EOS (ABSOLUTE): 0.1 10*3/uL (ref 0.0–0.4)
Eos: 2 %
HEMATOCRIT: 44.2 % (ref 34.0–46.6)
HEMOGLOBIN: 14.9 g/dL (ref 11.1–15.9)
Immature Grans (Abs): 0 10*3/uL (ref 0.0–0.1)
Immature Granulocytes: 0 %
LYMPHS ABS: 1.8 10*3/uL (ref 0.7–3.1)
Lymphs: 26 %
MCH: 29.6 pg (ref 26.6–33.0)
MCHC: 33.7 g/dL (ref 31.5–35.7)
MCV: 88 fL (ref 79–97)
Monocytes Absolute: 0.3 10*3/uL (ref 0.1–0.9)
Monocytes: 4 %
NEUTROS ABS: 4.5 10*3/uL (ref 1.4–7.0)
Neutrophils: 67 %
Platelets: 252 10*3/uL (ref 150–379)
RBC: 5.04 x10E6/uL (ref 3.77–5.28)
RDW: 14.3 % (ref 12.3–15.4)
SED RATE: 10 mm/h (ref 0–40)
URIC ACID: 4.6 mg/dL (ref 2.5–7.1)
WBC: 6.7 10*3/uL (ref 3.4–10.8)

## 2016-08-27 LAB — LIPID PANEL
CHOLESTEROL TOTAL: 159 mg/dL (ref 100–199)
Chol/HDL Ratio: 4.4 ratio (ref 0.0–4.4)
HDL: 36 mg/dL — ABNORMAL LOW (ref >39)
LDL Calculated: 86 mg/dL (ref 0–99)
TRIGLYCERIDES: 185 mg/dL — AB (ref 0–149)
VLDL CHOLESTEROL CAL: 37 mg/dL (ref 5–40)

## 2016-08-27 LAB — CMP14+EGFR
A/G RATIO: 1.5 (ref 1.2–2.2)
ALBUMIN: 4.1 g/dL (ref 3.5–5.5)
ALT: 17 IU/L (ref 0–32)
AST: 20 IU/L (ref 0–40)
Alkaline Phosphatase: 52 IU/L (ref 39–117)
BUN / CREAT RATIO: 11 (ref 9–23)
BUN: 10 mg/dL (ref 6–24)
Bilirubin Total: 1.1 mg/dL (ref 0.0–1.2)
CO2: 21 mmol/L (ref 20–29)
Calcium: 9.4 mg/dL (ref 8.7–10.2)
Chloride: 102 mmol/L (ref 96–106)
Creatinine, Ser: 0.91 mg/dL (ref 0.57–1.00)
GFR calc Af Amer: 82 mL/min/{1.73_m2} (ref >59)
GFR, EST NON AFRICAN AMERICAN: 71 mL/min/{1.73_m2} (ref >59)
GLOBULIN, TOTAL: 2.8 g/dL (ref 1.5–4.5)
Glucose: 195 mg/dL — ABNORMAL HIGH (ref 65–99)
Potassium: 4.2 mmol/L (ref 3.5–5.2)
SODIUM: 139 mmol/L (ref 134–144)
Total Protein: 6.9 g/dL (ref 6.0–8.5)

## 2016-08-27 NOTE — Progress Notes (Signed)
BP 92/68   Pulse 74   Temp 97.2 F (36.2 C) (Oral)   Ht _0  (1.6 m)   Wt 179 lb 3.2 oz (81.3 kg)   BMI 31.74 kg/m    Subjective:    Patient ID: Natalie Tanner, female    DOB: [birth year], 56 y.o.   MRN: [id number]  HPI: Natalie Tanner is a 56 y.o. female presenting on 08/26/2016 for Follow-up (3 month )  This patient comes in for periodic recheck on medications and conditions including uncontrolled diabetes, joint pain, hyperlipidemia, joint pain severe. She needs refills and labs performed today.   All medications are reviewed today. There are no reports of any problems with the medications. All of the medical conditions are reviewed and updated.  Lab work is reviewed and will be ordered as medically necessary. There are no new problems reported with today's visit.   Relevant past medical, surgical, family and social history reviewed and updated as indicated. Allergies and medications reviewed and updated.  Past Medical History:  Diagnosis Date  . Anxiety   . Coronary atherosclerosis of native coronary artery    Managed medically following cardiac catheterization 5/12  . Depression   . Essential hypertension, benign   . GERD (gastroesophageal reflux disease)   . Iron deficiency anemia    Secondary to menometrorrhagia  . Mixed hyperlipidemia   . Myocardial infarction [facility])    NSTEMI 5/12  . Nephrolithiasis   . Type 2 diabetes mellitus ([place])     Past Surgical History:  Procedure Laterality Date  . BACK SURGERY    . KNEE SURGERY    . SINUS SURGERY WITH INSTATRAK      Review of Systems  Constitutional: Negative.  Negative for activity change, fatigue and fever.  HENT: Negative.   Eyes: Negative.   Respiratory: Negative.  Negative for cough, shortness of breath and wheezing.   Cardiovascular: Negative.  Negative for chest pain, palpitations and leg swelling.  Gastrointestinal: Negative.  Negative for abdominal pain.  Endocrine: Negative.   Genitourinary: Negative.   Negative for dysuria.  Musculoskeletal: Positive for arthralgias, joint swelling and myalgias.  Skin: Negative.   Neurological: Negative.     Allergies as of 08/26/2016   No Known Allergies     Medication List       Accurate as of 08/26/16 11:59 PM. Always use your most recent med list.          ALPRAZolam 1 MG tablet Commonly known as:  XANAX Take 1 tablet (1 mg total) by mouth 2 (two) times daily as needed.   aspirin 81 MG tablet Take 81 mg by mouth daily.   empagliflozin 25 MG Tabs tablet Commonly known as:  JARDIANCE Take 25 mg by mouth daily.   glipiZIDE 10 MG tablet Commonly known as:  GLUCOTROL Take 1 tablet (10 mg total) by mouth daily.   isosorbide mononitrate 30 MG 24 hr tablet Commonly known as:  IMDUR Take 1 tablet (30 mg total) by mouth 2 (two) times daily.   lisinopril 40 MG tablet Commonly known as:  PRINIVIL,ZESTRIL Take 1 tablet (40 mg total) by mouth daily.   meclizine 25 MG tablet Commonly known as:  ANTIVERT Take 1 tablet (25 mg total) by mouth 3 (three) times daily as needed for dizziness.   metoprolol tartrate 50 MG tablet Commonly known as:  LOPRESSOR Take 1 tablet (50 mg total) by mouth 2 (two) times daily.   nitroGLYCERIN 0.4 MG SL tablet Commonly known as:  NITROSTAT  Place 1 tablet (0.4 mg total) under the tongue every 5 (five) minutes as needed for chest pain.   omeprazole 20 MG tablet Commonly known as:  PRILOSEC OTC Take 1 tablet (20 mg total) by mouth daily.   PARoxetine 20 MG tablet Commonly known as:  PAXIL Take 1 tablet (20 mg total) by mouth every morning.   pravastatin 40 MG tablet Commonly known as:  PRAVACHOL Take 1 tablet (40 mg total) by mouth daily.   sitaGLIPtin 100 MG tablet Commonly known as:  JANUVIA Take 1 tablet (100 mg total) by mouth daily.          Objective:    BP 92/68   Pulse 74   Temp 97.2 F (36.2 C) (Oral)   Ht _0  (1.6 m)   Wt 179 lb 3.2 oz (81.3 kg)   BMI 31.74 kg/m   No Known  Allergies  Physical Exam  Constitutional: She is oriented to person, place, and time. She appears well-developed and well-nourished.  HENT:  Head: Normocephalic and atraumatic.  Right Ear: Tympanic membrane, external ear and ear canal normal.  Left Ear: Tympanic membrane, external ear and ear canal normal.  Nose: Nose normal. No rhinorrhea.  Mouth/Throat: Oropharynx is clear and moist and mucous membranes are normal. No oropharyngeal exudate or posterior oropharyngeal erythema.  Eyes: Conjunctivae and EOM are normal. Pupils are equal, round, and reactive to light.  Neck: Normal range of motion. Neck supple.  Cardiovascular: Normal rate, regular rhythm, normal heart sounds and intact distal pulses.   Pulmonary/Chest: Effort normal and breath sounds normal.  Abdominal: Soft. Bowel sounds are normal.  Musculoskeletal:       Right hand: She exhibits decreased range of motion, deformity and swelling.       Left hand: She exhibits decreased range of motion, deformity and swelling.  Neurological: She is alert and oriented to person, place, and time. She has normal reflexes.  Skin: Skin is warm and dry. No rash noted.  Psychiatric: She has a normal mood and affect. Her behavior is normal. Judgment and thought content normal.    Results for orders placed or performed in visit on 08/26/16  Microalbumin / creatinine urine ratio  Result Value Ref Range   Creatinine, Urine 76.2 Not Estab. mg/dL   Albumin, Urine 12.2 Not Estab. ug/mL   Microalb/Creat Ratio 16.0 0.0 - 30.0 mg/g creat  Lipid panel  Result Value Ref Range   Cholesterol, Total 159 100 - 199 mg/dL   Triglycerides 185 (H) 0 - 149 mg/dL   HDL 36 (L) >39 mg/dL   VLDL Cholesterol Cal 37 5 - 40 mg/dL   LDL Calculated 86 0 - 99 mg/dL   Chol/HDL Ratio 4.4 0.0 - 4.4 ratio  CMP14+EGFR  Result Value Ref Range   Glucose 195 (H) 65 - 99 mg/dL   BUN 10 6 - 24 mg/dL   Creatinine, Ser 0.91 0.57 - 1.00 mg/dL   GFR calc non Af Amer 71 >59  mL/min/1.73   GFR calc Af Amer 82 >59 mL/min/1.73   BUN/Creatinine Ratio 11 9 - 23   Sodium 139 134 - 144 mmol/L   Potassium 4.2 3.5 - 5.2 mmol/L   Chloride 102 96 - 106 mmol/L   CO2 21 20 - 29 mmol/L   Calcium 9.4 8.7 - 10.2 mg/dL   Total Protein 6.9 6.0 - 8.5 g/dL   Albumin 4.1 3.5 - 5.5 g/dL   Globulin, Total 2.8 1.5 - 4.5 g/dL   Albumin/Globulin Ratio  1.5 1.2 - 2.2   Bilirubin Total 1.1 0.0 - 1.2 mg/dL   Alkaline Phosphatase 52 39 - 117 IU/L   AST 20 0 - 40 IU/L   ALT 17 0 - 32 IU/L  Bayer DCA Hb A1c Waived  Result Value Ref Range   Bayer DCA Hb A1c Waived 10.0 (H) <7.0 %  Arthritis Panel  Result Value Ref Range   Uric Acid 4.6 2.5 - 7.1 mg/dL   Rhuematoid fact SerPl-aCnc <10.0 0.0 - 13.9 IU/mL   WBC 6.7 3.4 - 10.8 x10E3/uL   RBC 5.04 3.77 - 5.28 x10E6/uL   Hemoglobin 14.9 11.1 - 15.9 g/dL   Hematocrit 44.2 34.0 - 46.6 %   MCV 88 79 - 97 fL   MCH 29.6 26.6 - 33.0 pg   MCHC 33.7 31.5 - 35.7 g/dL   RDW 14.3 12.3 - 15.4 %   Platelets 252 150 - 379 x10E3/uL   Neutrophils 67 Not Estab. %   Lymphs 26 Not Estab. %   Monocytes 4 Not Estab. %   Eos 2 Not Estab. %   Basos 1 Not Estab. %   Neutrophils Absolute 4.5 1.4 - 7.0 x10E3/uL   Lymphocytes Absolute 1.8 0.7 - 3.1 x10E3/uL   Monocytes Absolute 0.3 0.1 - 0.9 x10E3/uL   EOS (ABSOLUTE) 0.1 0.0 - 0.4 x10E3/uL   Basophils Absolute 0.0 0.0 - 0.2 x10E3/uL   Immature Granulocytes 0 Not Estab. %   Immature Grans (Abs) 0.0 0.0 - 0.1 x10E3/uL   Sed Rate 10 0 - 40 mm/hr      Assessment & Plan:   1. Essential hypertension, benign - Microalbumin / creatinine urine ratio - CMP14+EGFR  2. Type 2 diabetes mellitus without complication, without long-term current use of insulin (HCC) - Microalbumin / creatinine urine ratio - CMP14+EGFR - Bayer DCA Hb A1c Waived  3. Mixed hyperlipidemia - Lipid panel - CMP14+EGFR  4. Arthralgia of both hands - Arthritis Panel   Current Outpatient Prescriptions:  .  ALPRAZolam (XANAX) 1 MG  tablet, Take 1 tablet (1 mg total) by mouth 2 (two) times daily as needed., Disp: 180 tablet, Rfl: 1 .  aspirin 81 MG tablet, Take 81 mg by mouth daily.  , Disp: , Rfl:  .  empagliflozin (JARDIANCE) 25 MG TABS tablet, Take 25 mg by mouth daily., Disp: 90 tablet, Rfl: 3 .  glipiZIDE (GLUCOTROL) 10 MG tablet, Take 1 tablet (10 mg total) by mouth daily., Disp: 90 tablet, Rfl: 3 .  isosorbide mononitrate (IMDUR) 30 MG 24 hr tablet, Take 1 tablet (30 mg total) by mouth 2 (two) times daily., Disp: 180 tablet, Rfl: 3 .  lisinopril (PRINIVIL,ZESTRIL) 40 MG tablet, Take 1 tablet (40 mg total) by mouth daily., Disp: 90 tablet, Rfl: 3 .  meclizine (ANTIVERT) 25 MG tablet, Take 1 tablet (25 mg total) by mouth 3 (three) times daily as needed for dizziness., Disp: 90 tablet, Rfl: 11 .  metoprolol (LOPRESSOR) 50 MG tablet, Take 1 tablet (50 mg total) by mouth 2 (two) times daily., Disp: 180 tablet, Rfl: 3 .  nitroGLYCERIN (NITROSTAT) 0.4 MG SL tablet, Place 1 tablet (0.4 mg total) under the tongue every 5 (five) minutes as needed for chest pain., Disp: 25 tablet, Rfl: 2 .  omeprazole (PRILOSEC OTC) 20 MG tablet, Take 1 tablet (20 mg total) by mouth daily., Disp: 90 tablet, Rfl: 3 .  PARoxetine (PAXIL) 20 MG tablet, Take 1 tablet (20 mg total) by mouth every morning., Disp: 90 tablet, Rfl:  3 .  pravastatin (PRAVACHOL) 40 MG tablet, Take 1 tablet (40 mg total) by mouth daily., Disp: 90 tablet, Rfl: 1 .  sitaGLIPtin (JANUVIA) 100 MG tablet, Take 1 tablet (100 mg total) by mouth daily., Disp: 90 tablet, Rfl: 3  Continue all other maintenance medications as listed above.  Follow up plan: Return in about 3 months (around 11/26/2016) for recheck.  Educational handout given for carb counting  Terald Sleeper PA-C [facility] [address]  [place], [place] [address] [phone]   08/27/2016, 10:00 PM

## 2016-08-30 ENCOUNTER — Telehealth: Payer: Self-pay | Admitting: Physician Assistant

## 2016-08-30 MED ORDER — PIOGLITAZONE HCL 30 MG PO TABS: 30.0000 mg | ORAL_TABLET | Freq: Every day | ORAL | 1 refills | Status: DC

## 2016-08-30 NOTE — Telephone Encounter (Signed)
Patient aware of results.

## 2016-11-26 ENCOUNTER — Ambulatory Visit (INDEPENDENT_AMBULATORY_CARE_PROVIDER_SITE_OTHER): Admitting: Physician Assistant

## 2016-11-26 ENCOUNTER — Encounter: Payer: Self-pay | Admitting: Physician Assistant

## 2016-11-26 VITALS — BP 105/67 | HR 73 | Temp 98.3°F | Ht 63.0 in | Wt 190.8 lb

## 2016-11-26 DIAGNOSIS — I10 Essential (primary) hypertension: Secondary | ICD-10-CM

## 2016-11-26 DIAGNOSIS — E119 Type 2 diabetes mellitus without complications: Secondary | ICD-10-CM

## 2016-11-26 DIAGNOSIS — R42 Dizziness and giddiness: Secondary | ICD-10-CM

## 2016-11-26 DIAGNOSIS — M65312 Trigger thumb, left thumb: Secondary | ICD-10-CM | POA: Diagnosis not present

## 2016-11-26 DIAGNOSIS — M6580 Other synovitis and tenosynovitis, unspecified site: Secondary | ICD-10-CM

## 2016-11-26 DIAGNOSIS — I2510 Atherosclerotic heart disease of native coronary artery without angina pectoris: Secondary | ICD-10-CM | POA: Diagnosis not present

## 2016-11-26 LAB — BAYER DCA HB A1C WAIVED: HB A1C: 8.1 % — AB (ref <7.0)

## 2016-11-26 MED ORDER — BLOOD GLUCOSE MONITOR KIT: PACK | 11 refills | Status: DC

## 2016-11-26 MED ORDER — ALPRAZOLAM 1 MG PO TABS: 1.0000 mg | ORAL_TABLET | Freq: Two times a day (BID) | ORAL | 1 refills | Status: DC | PRN

## 2016-11-26 NOTE — Patient Instructions (Signed)
In a few days you may receive a survey in the mail or online from [employer] regarding your visit with us today. Please take a moment to fill this out. Your feedback is very important to our whole office. It can help us better understand your needs as well as improve your experience and satisfaction. Thank you for taking your time to complete it. We care about you.  Ronnie Doo, PA-C  

## 2016-11-27 DIAGNOSIS — M6580 Other synovitis and tenosynovitis, unspecified site: Secondary | ICD-10-CM | POA: Insufficient documentation

## 2016-11-27 DIAGNOSIS — M65312 Trigger thumb, left thumb: Secondary | ICD-10-CM | POA: Insufficient documentation

## 2016-11-27 LAB — CBC WITH DIFFERENTIAL/PLATELET
BASOS: 0 %
Basophils Absolute: 0 10*3/uL (ref 0.0–0.2)
EOS (ABSOLUTE): 0.1 10*3/uL (ref 0.0–0.4)
EOS: 2 %
HEMATOCRIT: 39.5 % (ref 34.0–46.6)
Hemoglobin: 13 g/dL (ref 11.1–15.9)
IMMATURE GRANS (ABS): 0 10*3/uL (ref 0.0–0.1)
Immature Granulocytes: 0 %
LYMPHS: 26 %
Lymphocytes Absolute: 1.8 10*3/uL (ref 0.7–3.1)
MCH: 28.5 pg (ref 26.6–33.0)
MCHC: 32.9 g/dL (ref 31.5–35.7)
MCV: 87 fL (ref 79–97)
MONOS ABS: 0.4 10*3/uL (ref 0.1–0.9)
Monocytes: 6 %
NEUTROS ABS: 4.6 10*3/uL (ref 1.4–7.0)
Neutrophils: 66 %
PLATELETS: 257 10*3/uL (ref 150–379)
RBC: 4.56 x10E6/uL (ref 3.77–5.28)
RDW: 14.2 % (ref 12.3–15.4)
WBC: 7 10*3/uL (ref 3.4–10.8)

## 2016-11-27 LAB — CMP14+EGFR
A/G RATIO: 1.5 (ref 1.2–2.2)
ALBUMIN: 4.4 g/dL (ref 3.5–5.5)
ALT: 16 IU/L (ref 0–32)
AST: 18 IU/L (ref 0–40)
Alkaline Phosphatase: 50 IU/L (ref 39–117)
BILIRUBIN TOTAL: 1.1 mg/dL (ref 0.0–1.2)
BUN / CREAT RATIO: 13 (ref 9–23)
BUN: 16 mg/dL (ref 6–24)
CHLORIDE: 100 mmol/L (ref 96–106)
CO2: 22 mmol/L (ref 20–29)
Calcium: 10 mg/dL (ref 8.7–10.2)
Creatinine, Ser: 1.19 mg/dL — ABNORMAL HIGH (ref 0.57–1.00)
GFR, EST AFRICAN AMERICAN: 59 mL/min/{1.73_m2} — AB (ref >59)
GFR, EST NON AFRICAN AMERICAN: 52 mL/min/{1.73_m2} — AB (ref >59)
GLOBULIN, TOTAL: 2.9 g/dL (ref 1.5–4.5)
Glucose: 148 mg/dL — ABNORMAL HIGH (ref 65–99)
POTASSIUM: 4.5 mmol/L (ref 3.5–5.2)
SODIUM: 138 mmol/L (ref 134–144)
TOTAL PROTEIN: 7.3 g/dL (ref 6.0–8.5)

## 2016-11-27 LAB — LIPID PANEL
CHOL/HDL RATIO: 4 ratio (ref 0.0–4.4)
Cholesterol, Total: 171 mg/dL (ref 100–199)
HDL: 43 mg/dL (ref >39)
LDL Calculated: 88 mg/dL (ref 0–99)
Triglycerides: 199 mg/dL — ABNORMAL HIGH (ref 0–149)
VLDL Cholesterol Cal: 40 mg/dL (ref 5–40)

## 2016-11-27 NOTE — Progress Notes (Signed)
BP 105/67   Pulse 73   Temp 98.3 F (36.8 C) (Oral)   Ht 5' 3"  (1.6 m)   Wt 190 lb 12.8 oz (86.5 kg)   BMI 33.80 kg/m    Subjective:    Patient ID: Natalie Tanner, female    DOB: 1960-09-02, 56 y.o.   MRN: [id number]  HPI: Natalie Tanner is a 56 y.o. female presenting on 11/26/2016 for Follow-up (3 month )  This patient comes in for periodic recheck on medications and conditions including Type 2 diabetes uncontrolled, CAD, hypertension, vertigo. She has had some improvement in recent days with working on her diet more diligently. She has gained some weight and is concerned that her A1c is can be up. She is currently still taking Actos 30 mg, glipizide 10 mg, Januvia 100 mg. She is unable to take metformin. Depending on what her A1c is we have discussed possibly having to add an injectable medication. If it is somewhat better we can increase the Actos to 45 mg. She does need a new glucometer with supplies. A prescription has been printed for her to take to her pharmacy. She will find out about the mail-in pharmacies options for this. She does use the [facility] pharmacy.  She has had increasing pain in the left thumb and calcifications in the tendons on his hand. She has not been to a specialist at this time. She does have locking of the thumb times and then it will click into another position.   All medications are reviewed today. There are no reports of any problems with the medications. All of the medical conditions are reviewed and updated.  Lab work is reviewed and will be ordered as medically necessary. There are no new problems reported with today's visit.   Relevant past medical, surgical, family and social history reviewed and updated as indicated. Allergies and medications reviewed and updated.  Past Medical History:  Diagnosis Date  . Anxiety   . Coronary atherosclerosis of native coronary artery    Managed medically following cardiac catheterization 5/12  . Depression   .  Essential hypertension, benign   . GERD (gastroesophageal reflux disease)   . Iron deficiency anemia    Secondary to menometrorrhagia  . Mixed hyperlipidemia   . Myocardial infarction [facility])    NSTEMI 5/12  . Nephrolithiasis   . Type 2 diabetes mellitus ([place])     Past Surgical History:  Procedure Laterality Date  . BACK SURGERY    . KNEE SURGERY    . SINUS SURGERY WITH INSTATRAK      Review of Systems  Constitutional: Negative.  Negative for activity change, fatigue and fever.  HENT: Negative.   Eyes: Negative.   Respiratory: Negative.  Negative for cough.   Cardiovascular: Negative.  Negative for chest pain.  Gastrointestinal: Negative.  Negative for abdominal pain.  Endocrine: Negative.   Genitourinary: Negative.  Negative for dysuria.  Musculoskeletal: Positive for arthralgias and myalgias.  Skin: Negative.   Neurological: Negative.  Negative for dizziness, weakness and numbness.    Allergies as of 11/26/2016   No Known Allergies     Medication List       Accurate as of 11/26/16 11:59 PM. Always use your most recent med list.          ALPRAZolam 1 MG tablet Commonly known as:  XANAX Take 1 tablet (1 mg total) by mouth 2 (two) times daily as needed.   aspirin 81 MG tablet Take 81 mg by mouth  daily.   blood glucose meter kit and supplies Kit Dispense based on patient and insurance preference. Use up to four times daily as directed. For diabetes E11.9, with hyperglycemia.   empagliflozin 25 MG Tabs tablet Commonly known as:  JARDIANCE Take 25 mg by mouth daily.   glipiZIDE 10 MG tablet Commonly known as:  GLUCOTROL Take 1 tablet (10 mg total) by mouth daily.   isosorbide mononitrate 30 MG 24 hr tablet Commonly known as:  IMDUR Take 1 tablet (30 mg total) by mouth 2 (two) times daily.   lisinopril 40 MG tablet Commonly known as:  PRINIVIL,ZESTRIL Take 1 tablet (40 mg total) by mouth daily.   meclizine 25 MG tablet Commonly known as:  ANTIVERT Take 1  tablet (25 mg total) by mouth 3 (three) times daily as needed for dizziness.   metoprolol tartrate 50 MG tablet Commonly known as:  LOPRESSOR Take 1 tablet (50 mg total) by mouth 2 (two) times daily.   nitroGLYCERIN 0.4 MG SL tablet Commonly known as:  NITROSTAT Place 1 tablet (0.4 mg total) under the tongue every 5 (five) minutes as needed for chest pain.   omeprazole 20 MG tablet Commonly known as:  PRILOSEC OTC Take 1 tablet (20 mg total) by mouth daily.   PARoxetine 20 MG tablet Commonly known as:  PAXIL Take 1 tablet (20 mg total) by mouth every morning.   pioglitazone 30 MG tablet Commonly known as:  ACTOS Take 1 tablet (30 mg total) by mouth daily.   pravastatin 40 MG tablet Commonly known as:  PRAVACHOL Take 1 tablet (40 mg total) by mouth daily.   sitaGLIPtin 100 MG tablet Commonly known as:  JANUVIA Take 1 tablet (100 mg total) by mouth daily.            Discharge Care Instructions        Start     Ordered   11/26/16 0000  CBC with Differential/Platelet     11/26/16 1638   11/26/16 0000  CMP14+EGFR     11/26/16 1638   11/26/16 0000  Lipid panel     11/26/16 1638   11/26/16 0000  Bayer DCA Hb A1c Waived     11/26/16 1638   11/26/16 0000  blood glucose meter kit and supplies KIT    Question Answer Comment  Supervising Provider Timmothy Euler   Number of strips 90   Number of lancets 90      11/26/16 1643   11/26/16 0000  ALPRAZolam (XANAX) 1 MG tablet  2 times daily PRN    Question:  Supervising Provider  Answer:  Timmothy Euler   11/26/16 1643   11/26/16 0000  Ambulatory referral to Orthopedic Surgery     11/26/16 1655         Objective:    BP 105/67   Pulse 73   Temp 98.3 F (36.8 C) (Oral)   Ht 5' 3"  (1.6 m)   Wt 190 lb 12.8 oz (86.5 kg)   BMI 33.80 kg/m   No Known Allergies  Physical Exam  Constitutional: She is oriented to person, place, and time. She appears well-developed and well-nourished.  HENT:  Head:  Normocephalic and atraumatic.  Eyes: Pupils are equal, round, and reactive to light. Conjunctivae and EOM are normal.  Cardiovascular: Normal rate, regular rhythm, normal heart sounds and intact distal pulses.   Pulmonary/Chest: Effort normal and breath sounds normal.  Abdominal: Soft. Bowel sounds are normal.  Musculoskeletal:  Right shoulder: She exhibits decreased range of motion, pain and spasm. She exhibits no tenderness, no swelling and no crepitus.       Arms: Neurological: She is alert and oriented to person, place, and time. She has normal reflexes.  Skin: Skin is warm and dry. No rash noted.  Psychiatric: She has a normal mood and affect. Her behavior is normal. Judgment and thought content normal.    Results for orders placed or performed in visit on 11/26/16  CBC with Differential/Platelet  Result Value Ref Range   WBC 7.0 3.4 - 10.8 x10E3/uL   RBC 4.56 3.77 - 5.28 x10E6/uL   Hemoglobin 13.0 11.1 - 15.9 g/dL   Hematocrit 39.5 34.0 - 46.6 %   MCV 87 79 - 97 fL   MCH 28.5 26.6 - 33.0 pg   MCHC 32.9 31.5 - 35.7 g/dL   RDW 14.2 12.3 - 15.4 %   Platelets 257 150 - 379 x10E3/uL   Neutrophils 66 Not Estab. %   Lymphs 26 Not Estab. %   Monocytes 6 Not Estab. %   Eos 2 Not Estab. %   Basos 0 Not Estab. %   Neutrophils Absolute 4.6 1.4 - 7.0 x10E3/uL   Lymphocytes Absolute 1.8 0.7 - 3.1 x10E3/uL   Monocytes Absolute 0.4 0.1 - 0.9 x10E3/uL   EOS (ABSOLUTE) 0.1 0.0 - 0.4 x10E3/uL   Basophils Absolute 0.0 0.0 - 0.2 x10E3/uL   Immature Granulocytes 0 Not Estab. %   Immature Grans (Abs) 0.0 0.0 - 0.1 x10E3/uL  CMP14+EGFR  Result Value Ref Range   Glucose 148 (H) 65 - 99 mg/dL   BUN 16 6 - 24 mg/dL   Creatinine, Ser 1.19 (H) 0.57 - 1.00 mg/dL   GFR calc non Af Amer 52 (L) >59 mL/min/1.73   GFR calc Af Amer 59 (L) >59 mL/min/1.73   BUN/Creatinine Ratio 13 9 - 23   Sodium 138 134 - 144 mmol/L   Potassium 4.5 3.5 - 5.2 mmol/L   Chloride 100 96 - 106 mmol/L   CO2 22 20 -  29 mmol/L   Calcium 10.0 8.7 - 10.2 mg/dL   Total Protein 7.3 6.0 - 8.5 g/dL   Albumin 4.4 3.5 - 5.5 g/dL   Globulin, Total 2.9 1.5 - 4.5 g/dL   Albumin/Globulin Ratio 1.5 1.2 - 2.2   Bilirubin Total 1.1 0.0 - 1.2 mg/dL   Alkaline Phosphatase 50 39 - 117 IU/L   AST 18 0 - 40 IU/L   ALT 16 0 - 32 IU/L  Lipid panel  Result Value Ref Range   Cholesterol, Total 171 100 - 199 mg/dL   Triglycerides 199 (H) 0 - 149 mg/dL   HDL 43 >39 mg/dL   VLDL Cholesterol Cal 40 5 - 40 mg/dL   LDL Calculated 88 0 - 99 mg/dL   Chol/HDL Ratio 4.0 0.0 - 4.4 ratio  Bayer DCA Hb A1c Waived  Result Value Ref Range   Bayer DCA Hb A1c Waived 8.1 (H) <7.0 %      Assessment & Plan:   1. Atherosclerosis of native coronary artery of native heart without angina pectoris - CBC with Differential/Platelet - CMP14+EGFR - Lipid panel - Bayer DCA Hb A1c Waived  2. Essential hypertension, benign  3. Type 2 diabetes mellitus without complication, without long-term current use of insulin (HCC)  - CBC with Differential/Platelet - CMP14+EGFR - Lipid panel - Bayer DCA Hb A1c Waived - blood glucose meter kit and supplies KIT; Dispense based on  patient and insurance preference. Use up to four times daily as directed. For diabetes E11.9, with hyperglycemia.  Dispense: 90 each; Refill: 11  4. Chronic vertigo  5. Trigger finger of left thumb - Ambulatory referral to Orthopedic Surgery  6. Calcification of tendon - Ambulatory referral to Orthopedic Surgery    Current Outpatient Prescriptions:  .  ALPRAZolam (XANAX) 1 MG tablet, Take 1 tablet (1 mg total) by mouth 2 (two) times daily as needed., Disp: 180 tablet, Rfl: 1 .  aspirin 81 MG tablet, Take 81 mg by mouth daily.  , Disp: , Rfl:  .  empagliflozin (JARDIANCE) 25 MG TABS tablet, Take 25 mg by mouth daily., Disp: 90 tablet, Rfl: 3 .  glipiZIDE (GLUCOTROL) 10 MG tablet, Take 1 tablet (10 mg total) by mouth daily., Disp: 90 tablet, Rfl: 3 .  isosorbide  mononitrate (IMDUR) 30 MG 24 hr tablet, Take 1 tablet (30 mg total) by mouth 2 (two) times daily., Disp: 180 tablet, Rfl: 3 .  lisinopril (PRINIVIL,ZESTRIL) 40 MG tablet, Take 1 tablet (40 mg total) by mouth daily., Disp: 90 tablet, Rfl: 3 .  metoprolol (LOPRESSOR) 50 MG tablet, Take 1 tablet (50 mg total) by mouth 2 (two) times daily., Disp: 180 tablet, Rfl: 3 .  nitroGLYCERIN (NITROSTAT) 0.4 MG SL tablet, Place 1 tablet (0.4 mg total) under the tongue every 5 (five) minutes as needed for chest pain., Disp: 25 tablet, Rfl: 2 .  omeprazole (PRILOSEC OTC) 20 MG tablet, Take 1 tablet (20 mg total) by mouth daily., Disp: 90 tablet, Rfl: 3 .  PARoxetine (PAXIL) 20 MG tablet, Take 1 tablet (20 mg total) by mouth every morning., Disp: 90 tablet, Rfl: 3 .  pioglitazone (ACTOS) 30 MG tablet, Take 1 tablet (30 mg total) by mouth daily., Disp: 90 tablet, Rfl: 1 .  pravastatin (PRAVACHOL) 40 MG tablet, Take 1 tablet (40 mg total) by mouth daily., Disp: 90 tablet, Rfl: 1 .  sitaGLIPtin (JANUVIA) 100 MG tablet, Take 1 tablet (100 mg total) by mouth daily., Disp: 90 tablet, Rfl: 3 .  blood glucose meter kit and supplies KIT, Dispense based on patient and insurance preference. Use up to four times daily as directed. For diabetes E11.9, with hyperglycemia., Disp: 90 each, Rfl: 11 .  meclizine (ANTIVERT) 25 MG tablet, Take 1 tablet (25 mg total) by mouth 3 (three) times daily as needed for dizziness. (Patient not taking: Reported on 11/26/2016), Disp: 90 tablet, Rfl: 11 Continue all other maintenance medications as listed above.  Follow up plan: Return in about 3 months (around 02/25/2017) for recheck and labs.  Educational handout given for [place] PA-C [place] [address]  [place], [place] [address] [phone]   11/27/2016, 10:59 AM

## 2016-12-03 ENCOUNTER — Encounter: Payer: Self-pay | Admitting: *Deleted

## 2017-03-06 ENCOUNTER — Ambulatory Visit: Admitting: Physician Assistant

## 2017-03-14 ENCOUNTER — Ambulatory Visit (INDEPENDENT_AMBULATORY_CARE_PROVIDER_SITE_OTHER): Admitting: Physician Assistant

## 2017-03-14 ENCOUNTER — Encounter: Payer: Self-pay | Admitting: Physician Assistant

## 2017-03-14 VITALS — BP 113/71 | HR 65 | Temp 97.4°F | Ht 63.0 in | Wt 196.0 lb

## 2017-03-14 DIAGNOSIS — M19049 Primary osteoarthritis, unspecified hand: Secondary | ICD-10-CM

## 2017-03-14 DIAGNOSIS — M65312 Trigger thumb, left thumb: Secondary | ICD-10-CM | POA: Diagnosis not present

## 2017-03-14 DIAGNOSIS — E1165 Type 2 diabetes mellitus with hyperglycemia: Secondary | ICD-10-CM | POA: Diagnosis not present

## 2017-03-14 LAB — BAYER DCA HB A1C WAIVED: HB A1C (BAYER DCA - WAIVED): 7 % — ABNORMAL HIGH (ref <7.0)

## 2017-03-14 MED ORDER — METOPROLOL TARTRATE 50 MG PO TABS: 50.0000 mg | ORAL_TABLET | Freq: Two times a day (BID) | ORAL | 3 refills | Status: DC

## 2017-03-14 MED ORDER — SITAGLIPTIN PHOSPHATE 100 MG PO TABS: 100.0000 mg | ORAL_TABLET | Freq: Every day | ORAL | 1 refills | Status: DC

## 2017-03-14 MED ORDER — PAROXETINE HCL 20 MG PO TABS: 20.0000 mg | ORAL_TABLET | ORAL | 3 refills | Status: DC

## 2017-03-14 MED ORDER — PIOGLITAZONE HCL 30 MG PO TABS: 30.0000 mg | ORAL_TABLET | Freq: Every day | ORAL | 1 refills | Status: DC

## 2017-03-14 MED ORDER — ALPRAZOLAM 1 MG PO TABS: 1.0000 mg | ORAL_TABLET | Freq: Two times a day (BID) | ORAL | 1 refills | Status: DC | PRN

## 2017-03-14 MED ORDER — ISOSORBIDE MONONITRATE ER 30 MG PO TB24: 30.0000 mg | ORAL_TABLET | Freq: Two times a day (BID) | ORAL | 3 refills | Status: DC

## 2017-03-14 MED ORDER — LISINOPRIL 40 MG PO TABS: 40.0000 mg | ORAL_TABLET | Freq: Every day | ORAL | 3 refills | Status: DC

## 2017-03-14 MED ORDER — EMPAGLIFLOZIN 25 MG PO TABS: 25.0000 mg | ORAL_TABLET | Freq: Every day | ORAL | 3 refills | Status: DC

## 2017-03-14 MED ORDER — NAPROXEN 500 MG PO TABS: 500.0000 mg | ORAL_TABLET | Freq: Two times a day (BID) | ORAL | 1 refills | Status: DC

## 2017-03-14 MED ORDER — GLIPIZIDE 10 MG PO TABS: 10.0000 mg | ORAL_TABLET | Freq: Every day | ORAL | 3 refills | Status: DC

## 2017-03-14 MED ORDER — PRAVASTATIN SODIUM 40 MG PO TABS: 40.0000 mg | ORAL_TABLET | Freq: Every day | ORAL | 3 refills | Status: DC

## 2017-03-14 MED ORDER — OMEPRAZOLE MAGNESIUM 20 MG PO TBEC: 20.0000 mg | DELAYED_RELEASE_TABLET | Freq: Every day | ORAL | 3 refills | Status: DC

## 2017-03-14 NOTE — Patient Instructions (Signed)
In a few days you may receive a survey in the mail or online from [employer] regarding your visit with us today. Please take a moment to fill this out. Your feedback is very important to our whole office. It can help us better understand your needs as well as improve your experience and satisfaction. Thank you for taking your time to complete it. We care about you.  Kristina Mcnorton, PA-C  

## 2017-03-15 LAB — BMP8+EGFR
BUN / CREAT RATIO: 13 (ref 9–23)
BUN: 13 mg/dL (ref 6–24)
CHLORIDE: 104 mmol/L (ref 96–106)
CO2: 22 mmol/L (ref 20–29)
Calcium: 9.7 mg/dL (ref 8.7–10.2)
Creatinine, Ser: 0.99 mg/dL (ref 0.57–1.00)
GFR calc Af Amer: 74 mL/min/{1.73_m2} (ref >59)
GFR calc non Af Amer: 64 mL/min/{1.73_m2} (ref >59)
GLUCOSE: 125 mg/dL — AB (ref 65–99)
Potassium: 4.5 mmol/L (ref 3.5–5.2)
SODIUM: 141 mmol/L (ref 134–144)

## 2017-03-15 LAB — MICROALBUMIN / CREATININE URINE RATIO
CREATININE, UR: 103 mg/dL
Microalb/Creat Ratio: 20.8 mg/g creat (ref 0.0–30.0)
Microalbumin, Urine: 21.4 ug/mL

## 2017-03-16 NOTE — Progress Notes (Addendum)
BP 113/71   Pulse 65   Temp (!) 97.4 F (36.3 C) (Oral)   Ht 5' 3"  (1.6 m)   Wt 196 lb (88.9 kg)   BMI 34.72 kg/m    Subjective:    Patient ID: Natalie Tanner, female    DOB: 08/06/1960, 57 y.o.   MRN: [id number]  HPI: Natalie Tanner is a 57 y.o. female presenting on 03/14/2017 for 3 month follow-up  This patient comes in for periodic recheck on medications and conditions including diabetes with fairly good numbers each morning. Highest readings are low 300s but are attributed to certain foods.  She needs labs and refills updated.   All medications are reviewed today. There are no reports of any problems with the medications. All of the medical conditions are reviewed and updated.  Lab work is reviewed and will be ordered as medically necessary. There are no new problems reported with today's visit.  She has been seeing a hand specialist for her trigger thumb.  It has been much worse in recent weeks.  It will lock up at times.  I have encouraged her to continue with the medications and their line of treatment.  Relevant past medical, surgical, family and social history reviewed and updated as indicated. Allergies and medications reviewed and updated.  Past Medical History:  Diagnosis Date  . Anxiety   . Coronary atherosclerosis of native coronary artery    Managed medically following cardiac catheterization 5/12  . Depression   . Essential hypertension, benign   . GERD (gastroesophageal reflux disease)   . Iron deficiency anemia    Secondary to menometrorrhagia  . Mixed hyperlipidemia   . Myocardial infarction [facility])    NSTEMI 5/12  . Nephrolithiasis   . Type 2 diabetes mellitus ([name])     Past Surgical History:  Procedure Laterality Date  . BACK SURGERY    . KNEE SURGERY    . SINUS SURGERY WITH INSTATRAK      Review of Systems  Constitutional: Negative.  Negative for activity change, fatigue and fever.  HENT: Negative.   Eyes: Negative.   Respiratory: Negative.   Negative for cough.   Cardiovascular: Negative.  Negative for chest pain.  Gastrointestinal: Negative.  Negative for abdominal pain.  Endocrine: Negative.   Genitourinary: Negative.  Negative for dysuria.  Musculoskeletal: Negative.   Skin: Negative.   Neurological: Negative.     Allergies as of 03/14/2017   No Known Allergies     Medication List        Accurate as of 03/14/17 11:59 PM. Always use your most recent med list.          ALPRAZolam 1 MG tablet Commonly known as:  XANAX Take 1 tablet (1 mg total) by mouth 2 (two) times daily as needed.   aspirin 81 MG tablet Take 81 mg by mouth daily.   blood glucose meter kit and supplies Kit Dispense based on patient and insurance preference. Use up to four times daily as directed. For diabetes E11.9, with hyperglycemia.   empagliflozin 25 MG Tabs tablet Commonly known as:  JARDIANCE Take 25 mg by mouth daily.   glipiZIDE 10 MG tablet Commonly known as:  GLUCOTROL Take 1 tablet (10 mg total) by mouth daily.   isosorbide mononitrate 30 MG 24 hr tablet Commonly known as:  IMDUR Take 1 tablet (30 mg total) by mouth 2 (two) times daily.   lisinopril 40 MG tablet Commonly known as:  PRINIVIL,ZESTRIL Take 1 tablet (40 mg total)  by mouth daily.   meclizine 25 MG tablet Commonly known as:  ANTIVERT Take 1 tablet (25 mg total) by mouth 3 (three) times daily as needed for dizziness.   metoprolol tartrate 50 MG tablet Commonly known as:  LOPRESSOR Take 1 tablet (50 mg total) by mouth 2 (two) times daily.   naproxen 500 MG tablet Commonly known as:  NAPROSYN Take 1 tablet (500 mg total) by mouth 2 (two) times daily with a meal.   nitroGLYCERIN 0.4 MG SL tablet Commonly known as:  NITROSTAT Place 1 tablet (0.4 mg total) under the tongue every 5 (five) minutes as needed for chest pain.   omeprazole 20 MG tablet Commonly known as:  PRILOSEC OTC Take 1 tablet (20 mg total) by mouth daily.   PARoxetine 20 MG  tablet Commonly known as:  PAXIL Take 1 tablet (20 mg total) by mouth every morning.   pioglitazone 30 MG tablet Commonly known as:  ACTOS Take 1 tablet (30 mg total) by mouth daily.   pravastatin 40 MG tablet Commonly known as:  PRAVACHOL Take 1 tablet (40 mg total) by mouth daily.   sitaGLIPtin 100 MG tablet Commonly known as:  JANUVIA Take 1 tablet (100 mg total) by mouth daily.          Objective:    BP 113/71   Pulse 65   Temp (!) 97.4 F (36.3 C) (Oral)   Ht 5' 3"  (1.6 m)   Wt 196 lb (88.9 kg)   BMI 34.72 kg/m   No Known Allergies  Physical Exam  Constitutional: She is oriented to person, place, and time. She appears well-developed and well-nourished.  HENT:  Head: Normocephalic and atraumatic.  Eyes: Conjunctivae and EOM are normal. Pupils are equal, round, and reactive to light.  Cardiovascular: Normal rate, regular rhythm, normal heart sounds and intact distal pulses.  Pulmonary/Chest: Effort normal and breath sounds normal.  Abdominal: Soft. Bowel sounds are normal.  Musculoskeletal:       Left hand: She exhibits decreased range of motion and tenderness.       Hands: Neurological: She is alert and oriented to person, place, and time. She has normal reflexes.  Skin: Skin is warm and dry. No rash noted.  Psychiatric: She has a normal mood and affect. Her behavior is normal. Judgment and thought content normal.  Nursing note and vitals reviewed.   Results for orders placed or performed in visit on 03/14/17  [facility]  Result Value Ref Range   Glucose 125 (H) 65 - 99 mg/dL   BUN 13 6 - 24 mg/dL   Creatinine, Ser 0.99 0.57 - 1.00 mg/dL   GFR calc non Af Amer 64 >59 mL/min/1.73   GFR calc Af Amer 74 >59 mL/min/1.73   BUN/Creatinine Ratio 13 9 - 23   Sodium 141 134 - 144 mmol/L   Potassium 4.5 3.5 - 5.2 mmol/L   Chloride 104 96 - 106 mmol/L   CO2 22 20 - 29 mmol/L   Calcium 9.7 8.7 - 10.2 mg/dL  Microalbumin / creatinine urine ratio  Result Value Ref  Range   Creatinine, Urine 103.0 Not Estab. mg/dL   Albumin, Urine 21.4 Not Estab. ug/mL   Microalb/Creat Ratio 20.8 0.0 - 30.0 mg/g creat  Bayer DCA Hb A1c Waived  Result Value Ref Range   Bayer DCA Hb A1c Waived 7.0 (H) <7.0 %      Assessment & Plan:   1. Type 2 diabetes mellitus with hyperglycemia, without long-term current use  of insulin (HCC) - BMP8+EGFR - Microalbumin / creatinine urine ratio - Bayer DCA Hb A1c Waived - empagliflozin (JARDIANCE) 25 MG TABS tablet; Take 25 mg by mouth daily.  Dispense: 90 tablet; Refill: 3 - glipiZIDE (GLUCOTROL) 10 MG tablet; Take 1 tablet (10 mg total) by mouth daily.  Dispense: 90 tablet; Refill: 3 - pioglitazone (ACTOS) 30 MG tablet; Take 1 tablet (30 mg total) by mouth daily.  Dispense: 90 tablet; Refill: 1 - sitaGLIPtin (JANUVIA) 100 MG tablet; Take 1 tablet (100 mg total) by mouth daily.  Dispense: 90 tablet; Refill: 1  2. Trigger finger of left thumb  3. Osteoarthritis of finger, unspecified laterality    Current Outpatient Medications:  .  ALPRAZolam (XANAX) 1 MG tablet, Take 1 tablet (1 mg total) by mouth 2 (two) times daily as needed., Disp: 180 tablet, Rfl: 1 .  aspirin 81 MG tablet, Take 81 mg by mouth daily.  , Disp: , Rfl:  .  blood glucose meter kit and supplies KIT, Dispense based on patient and insurance preference. Use up to four times daily as directed. For diabetes E11.9, with hyperglycemia., Disp: 90 each, Rfl: 11 .  empagliflozin (JARDIANCE) 25 MG TABS tablet, Take 25 mg by mouth daily., Disp: 90 tablet, Rfl: 3 .  glipiZIDE (GLUCOTROL) 10 MG tablet, Take 1 tablet (10 mg total) by mouth daily., Disp: 90 tablet, Rfl: 3 .  isosorbide mononitrate (IMDUR) 30 MG 24 hr tablet, Take 1 tablet (30 mg total) by mouth 2 (two) times daily., Disp: 180 tablet, Rfl: 3 .  lisinopril (PRINIVIL,ZESTRIL) 40 MG tablet, Take 1 tablet (40 mg total) by mouth daily., Disp: 90 tablet, Rfl: 3 .  meclizine (ANTIVERT) 25 MG tablet, Take 1 tablet (25  mg total) by mouth 3 (three) times daily as needed for dizziness., Disp: 90 tablet, Rfl: 11 .  metoprolol tartrate (LOPRESSOR) 50 MG tablet, Take 1 tablet (50 mg total) by mouth 2 (two) times daily., Disp: 180 tablet, Rfl: 3 .  naproxen (NAPROSYN) 500 MG tablet, Take 1 tablet (500 mg total) by mouth 2 (two) times daily with a meal., Disp: 180 tablet, Rfl: 1 .  nitroGLYCERIN (NITROSTAT) 0.4 MG SL tablet, Place 1 tablet (0.4 mg total) under the tongue every 5 (five) minutes as needed for chest pain., Disp: 25 tablet, Rfl: 2 .  omeprazole (PRILOSEC OTC) 20 MG tablet, Take 1 tablet (20 mg total) by mouth daily., Disp: 90 tablet, Rfl: 3 .  PARoxetine (PAXIL) 20 MG tablet, Take 1 tablet (20 mg total) by mouth every morning., Disp: 90 tablet, Rfl: 3 .  pioglitazone (ACTOS) 30 MG tablet, Take 1 tablet (30 mg total) by mouth daily., Disp: 90 tablet, Rfl: 1 .  pravastatin (PRAVACHOL) 40 MG tablet, Take 1 tablet (40 mg total) by mouth daily., Disp: 90 tablet, Rfl: 3 .  sitaGLIPtin (JANUVIA) 100 MG tablet, Take 1 tablet (100 mg total) by mouth daily., Disp: 90 tablet, Rfl: 1 Continue all other maintenance medications as listed above.  Follow up plan: Return in about 4 months (around 07/12/2017) for recheck.  Educational handout given for [place] PA-C [place] [address]  [place], [place] [address] [phone]   03/16/2017, 9:58 PM

## 2017-07-14 ENCOUNTER — Encounter: Payer: Self-pay | Admitting: Physician Assistant

## 2017-07-14 ENCOUNTER — Ambulatory Visit (INDEPENDENT_AMBULATORY_CARE_PROVIDER_SITE_OTHER): Admitting: Physician Assistant

## 2017-07-14 VITALS — BP 93/65 | HR 69 | Temp 97.3°F | Ht 63.0 in | Wt 193.0 lb

## 2017-07-14 DIAGNOSIS — E1165 Type 2 diabetes mellitus with hyperglycemia: Secondary | ICD-10-CM

## 2017-07-14 DIAGNOSIS — I10 Essential (primary) hypertension: Secondary | ICD-10-CM | POA: Diagnosis not present

## 2017-07-14 DIAGNOSIS — Z1239 Encounter for other screening for malignant neoplasm of breast: Secondary | ICD-10-CM

## 2017-07-14 DIAGNOSIS — E782 Mixed hyperlipidemia: Secondary | ICD-10-CM

## 2017-07-14 DIAGNOSIS — Z1231 Encounter for screening mammogram for malignant neoplasm of breast: Secondary | ICD-10-CM | POA: Diagnosis not present

## 2017-07-14 LAB — BAYER DCA HB A1C WAIVED: HB A1C (BAYER DCA - WAIVED): 6.7 % (ref <7.0)

## 2017-07-14 MED ORDER — ALPRAZOLAM 1 MG PO TABS: 1.0000 mg | ORAL_TABLET | Freq: Two times a day (BID) | ORAL | 1 refills | Status: DC | PRN

## 2017-07-14 MED ORDER — PIOGLITAZONE HCL 30 MG PO TABS: 30.0000 mg | ORAL_TABLET | Freq: Every day | ORAL | 1 refills | Status: DC

## 2017-07-14 MED ORDER — SITAGLIPTIN PHOSPHATE 100 MG PO TABS: 100.0000 mg | ORAL_TABLET | Freq: Every day | ORAL | 1 refills | Status: DC

## 2017-07-14 MED ORDER — NITROGLYCERIN 0.4 MG SL SUBL: 0.4000 mg | SUBLINGUAL_TABLET | SUBLINGUAL | 2 refills | Status: DC | PRN

## 2017-07-14 MED ORDER — NAPROXEN 500 MG PO TABS: 500.0000 mg | ORAL_TABLET | Freq: Two times a day (BID) | ORAL | 1 refills | Status: DC

## 2017-07-14 NOTE — Patient Instructions (Addendum)
In a few days you may receive a survey in the mail or online from [employer] regarding your visit with [place] today. Please take a moment to fill this out. Your feedback is very important to our whole office. It can help [place] better understand your needs as well as improve your experience and satisfaction. Thank you for taking your time to complete it. We care about you.  Particia Nearing, PA-C  [facility]

## 2017-07-15 LAB — CMP14+EGFR
ALT: 15 IU/L (ref 0–32)
AST: 17 IU/L (ref 0–40)
Albumin/Globulin Ratio: 1.6 (ref 1.2–2.2)
Albumin: 4.2 g/dL (ref 3.5–5.5)
Alkaline Phosphatase: 59 IU/L (ref 39–117)
BUN/Creatinine Ratio: 12 (ref 9–23)
BUN: 11 mg/dL (ref 6–24)
Bilirubin Total: 0.4 mg/dL (ref 0.0–1.2)
CALCIUM: 9.8 mg/dL (ref 8.7–10.2)
CO2: 19 mmol/L — AB (ref 20–29)
CREATININE: 0.92 mg/dL (ref 0.57–1.00)
Chloride: 107 mmol/L — ABNORMAL HIGH (ref 96–106)
GFR, EST AFRICAN AMERICAN: 80 mL/min/{1.73_m2} (ref >59)
GFR, EST NON AFRICAN AMERICAN: 70 mL/min/{1.73_m2} (ref >59)
GLOBULIN, TOTAL: 2.6 g/dL (ref 1.5–4.5)
Glucose: 117 mg/dL — ABNORMAL HIGH (ref 65–99)
Potassium: 4.4 mmol/L (ref 3.5–5.2)
Sodium: 141 mmol/L (ref 134–144)
TOTAL PROTEIN: 6.8 g/dL (ref 6.0–8.5)

## 2017-07-15 LAB — CBC WITH DIFFERENTIAL/PLATELET
BASOS: 0 %
Basophils Absolute: 0 10*3/uL (ref 0.0–0.2)
EOS (ABSOLUTE): 0.3 10*3/uL (ref 0.0–0.4)
EOS: 4 %
HEMATOCRIT: 43 % (ref 34.0–46.6)
Hemoglobin: 13.6 g/dL (ref 11.1–15.9)
IMMATURE GRANS (ABS): 0 10*3/uL (ref 0.0–0.1)
IMMATURE GRANULOCYTES: 0 %
LYMPHS: 23 %
Lymphocytes Absolute: 1.7 10*3/uL (ref 0.7–3.1)
MCH: 28.4 pg (ref 26.6–33.0)
MCHC: 31.6 g/dL (ref 31.5–35.7)
MCV: 90 fL (ref 79–97)
MONOS ABS: 0.3 10*3/uL (ref 0.1–0.9)
Monocytes: 5 %
NEUTROS PCT: 68 %
Neutrophils Absolute: 4.8 10*3/uL (ref 1.4–7.0)
PLATELETS: 281 10*3/uL (ref 150–379)
RBC: 4.79 x10E6/uL (ref 3.77–5.28)
RDW: 16.9 % — AB (ref 12.3–15.4)
WBC: 7.2 10*3/uL (ref 3.4–10.8)

## 2017-07-15 LAB — LIPID PANEL
CHOL/HDL RATIO: 3.4 ratio (ref 0.0–4.4)
Cholesterol, Total: 170 mg/dL (ref 100–199)
HDL: 50 mg/dL (ref >39)
LDL CALC: 89 mg/dL (ref 0–99)
TRIGLYCERIDES: 155 mg/dL — AB (ref 0–149)
VLDL Cholesterol Cal: 31 mg/dL (ref 5–40)

## 2017-07-15 NOTE — Progress Notes (Signed)
BP 93/65 (BP Location: Left Arm)   Pulse 69   Temp (!) 97.3 F (36.3 C) (Oral)   Ht 5' 3"  (1.6 m)   Wt 193 lb (87.5 kg)   BMI 34.19 kg/m    Subjective:    Patient ID: Natalie Tanner, female    DOB: [birth year], 57 y.o.   MRN: [id number]  HPI: Natalie Tanner is a 57 y.o. female presenting on 07/14/2017 for Hyperlipidemia (4 mo); Diabetes; and Hypertension  This patient comes in for recheck on her chronic medical conditions which are positive for type 2 diabetes, hypertension, hyperlipidemia.  She does also need a mammogram.  She will let us schedule this with our mammogram bus that comes.  She states that she has had fairly good sugar readings recently she has no complaints at this time.  She denies any chest pain or shortness of breath.  She states overall she is feeling well and tolerating her medications well.  Past Medical History:  Diagnosis Date  . Anxiety   . Coronary atherosclerosis of native coronary artery    Managed medically following cardiac catheterization 5/12  . Depression   . Essential hypertension, benign   . GERD (gastroesophageal reflux disease)   . Iron deficiency anemia    Secondary to menometrorrhagia  . Mixed hyperlipidemia   . Myocardial infarction [facility])    NSTEMI 5/12  . Nephrolithiasis   . Type 2 diabetes mellitus (HCC)    Relevant past medical, surgical, family and social history reviewed and updated as indicated. Interim medical history since our last visit reviewed. Allergies and medications reviewed and updated. DATA REVIEWED: CHART IN EPIC  Family History reviewed for pertinent findings.  Review of Systems  Constitutional: Negative.  Negative for activity change, fatigue and fever.  HENT: Negative.   Eyes: Negative.   Respiratory: Negative.  Negative for cough.   Cardiovascular: Negative.  Negative for chest pain.  Gastrointestinal: Negative.  Negative for abdominal pain.  Endocrine: Negative.   Genitourinary: Negative.  Negative for  dysuria.  Musculoskeletal: Negative.   Skin: Negative.   Neurological: Negative.     Allergies as of 07/14/2017   No Known Allergies     Medication List        Accurate as of 07/14/17 11:59 PM. Always use your most recent med list.          ALPRAZolam 1 MG tablet Commonly known as:  XANAX Take 1 tablet (1 mg total) by mouth 2 (two) times daily as needed.   aspirin 81 MG tablet Take 81 mg by mouth daily.   blood glucose meter kit and supplies Kit Dispense based on patient and insurance preference. Use up to four times daily as directed. For diabetes E11.9, with hyperglycemia.   empagliflozin 25 MG Tabs tablet Commonly known as:  JARDIANCE Take 25 mg by mouth daily.   glipiZIDE 10 MG tablet Commonly known as:  GLUCOTROL Take 1 tablet (10 mg total) by mouth daily.   isosorbide mononitrate 30 MG 24 hr tablet Commonly known as:  IMDUR Take 1 tablet (30 mg total) by mouth 2 (two) times daily.   lisinopril 40 MG tablet Commonly known as:  PRINIVIL,ZESTRIL Take 1 tablet (40 mg total) by mouth daily.   meclizine 25 MG tablet Commonly known as:  ANTIVERT Take 1 tablet (25 mg total) by mouth 3 (three) times daily as needed for dizziness.   metoprolol tartrate 50 MG tablet Commonly known as:  LOPRESSOR Take 1 tablet (50 mg  total) by mouth 2 (two) times daily.   naproxen 500 MG tablet Commonly known as:  NAPROSYN Take 1 tablet (500 mg total) by mouth 2 (two) times daily with a meal.   nitroGLYCERIN 0.4 MG SL tablet Commonly known as:  NITROSTAT Place 1 tablet (0.4 mg total) under the tongue every 5 (five) minutes as needed for chest pain.   omeprazole 20 MG tablet Commonly known as:  PRILOSEC OTC Take 1 tablet (20 mg total) by mouth daily.   PARoxetine 20 MG tablet Commonly known as:  PAXIL Take 1 tablet (20 mg total) by mouth every morning.   pioglitazone 30 MG tablet Commonly known as:  ACTOS Take 1 tablet (30 mg total) by mouth daily.   pravastatin 40 MG  tablet Commonly known as:  PRAVACHOL Take 1 tablet (40 mg total) by mouth daily.   sitaGLIPtin 100 MG tablet Commonly known as:  JANUVIA Take 1 tablet (100 mg total) by mouth daily.          Objective:    BP 93/65 (BP Location: Left Arm)   Pulse 69   Temp (!) 97.3 F (36.3 C) (Oral)   Ht 5' 3"  (1.6 m)   Wt 193 lb (87.5 kg)   BMI 34.19 kg/m   No Known Allergies  Wt Readings from Last 3 Encounters:  07/14/17 193 lb (87.5 kg)  03/14/17 196 lb (88.9 kg)  11/26/16 190 lb 12.8 oz (86.5 kg)    Physical Exam  Constitutional: She is oriented to person, place, and time. She appears well-developed and well-nourished.  HENT:  Head: Normocephalic and atraumatic.  Eyes: Pupils are equal, round, and reactive to light. Conjunctivae and EOM are normal.  Cardiovascular: Normal rate, regular rhythm, normal heart sounds and intact distal pulses.  Pulmonary/Chest: Effort normal and breath sounds normal.  Abdominal: Soft. Bowel sounds are normal.  Neurological: She is alert and oriented to person, place, and time. She has normal reflexes.  Skin: Skin is warm and dry. No rash noted.  Psychiatric: She has a normal mood and affect. Her behavior is normal. Judgment and thought content normal.    Results for orders placed or performed in visit on 07/14/17  CBC with Differential/Platelet  Result Value Ref Range   WBC 7.2 3.4 - 10.8 x10E3/uL   RBC 4.79 3.77 - 5.28 x10E6/uL   Hemoglobin 13.6 11.1 - 15.9 g/dL   Hematocrit 43.0 34.0 - 46.6 %   MCV 90 79 - 97 fL   MCH 28.4 26.6 - 33.0 pg   MCHC 31.6 31.5 - 35.7 g/dL   RDW 16.9 (H) 12.3 - 15.4 %   Platelets 281 150 - 379 x10E3/uL   Neutrophils 68 Not Estab. %   Lymphs 23 Not Estab. %   Monocytes 5 Not Estab. %   Eos 4 Not Estab. %   Basos 0 Not Estab. %   Neutrophils Absolute 4.8 1.4 - 7.0 x10E3/uL   Lymphocytes Absolute 1.7 0.7 - 3.1 x10E3/uL   Monocytes Absolute 0.3 0.1 - 0.9 x10E3/uL   EOS (ABSOLUTE) 0.3 0.0 - 0.4 x10E3/uL   Basophils  Absolute 0.0 0.0 - 0.2 x10E3/uL   Immature Granulocytes 0 Not Estab. %   Immature Grans (Abs) 0.0 0.0 - 0.1 x10E3/uL  CMP14+EGFR  Result Value Ref Range   Glucose 117 (H) 65 - 99 mg/dL   BUN 11 6 - 24 mg/dL   Creatinine, Ser 0.92 0.57 - 1.00 mg/dL   GFR calc non Af Amer 70 >59 mL/min/1.73  GFR calc Af Amer 80 >59 mL/min/1.73   BUN/Creatinine Ratio 12 9 - 23   Sodium 141 134 - 144 mmol/L   Potassium 4.4 3.5 - 5.2 mmol/L   Chloride 107 (H) 96 - 106 mmol/L   CO2 19 (L) 20 - 29 mmol/L   Calcium 9.8 8.7 - 10.2 mg/dL   Total Protein 6.8 6.0 - 8.5 g/dL   Albumin 4.2 3.5 - 5.5 g/dL   Globulin, Total 2.6 1.5 - 4.5 g/dL   Albumin/Globulin Ratio 1.6 1.2 - 2.2   Bilirubin Total 0.4 0.0 - 1.2 mg/dL   Alkaline Phosphatase 59 39 - 117 IU/L   AST 17 0 - 40 IU/L   ALT 15 0 - 32 IU/L  Lipid panel  Result Value Ref Range   Cholesterol, Total 170 100 - 199 mg/dL   Triglycerides 155 (H) 0 - 149 mg/dL   HDL 50 >39 mg/dL   VLDL Cholesterol Cal 31 5 - 40 mg/dL   LDL Calculated 89 0 - 99 mg/dL   Chol/HDL Ratio 3.4 0.0 - 4.4 ratio  Bayer DCA Hb A1c Waived  Result Value Ref Range   Bayer DCA Hb A1c Waived 6.7 <7.0 %      Assessment & Plan:   1. Type 2 diabetes mellitus with hyperglycemia, without long-term current use of insulin (HCC) - CBC with Differential/Platelet - CMP14+EGFR - Bayer DCA Hb A1c Waived - pioglitazone (ACTOS) 30 MG tablet; Take 1 tablet (30 mg total) by mouth daily.  Dispense: 90 tablet; Refill: 1 - sitaGLIPtin (JANUVIA) 100 MG tablet; Take 1 tablet (100 mg total) by mouth daily.  Dispense: 90 tablet; Refill: 1 - Microalbumin / creatinine urine ratio  2. Essential hypertension, benign - CBC with Differential/Platelet - CMP14+EGFR  3. Mixed hyperlipidemia - Lipid panel  4. Screening for breast cancer - MM Digital Screening; Future   Continue all other maintenance medications as listed above.  Follow up plan: Return in about 4 months (around 11/14/2017) for  recheck.  Educational handout given for [place] PA-C [facility] [address]  [place], [place] [address] [phone]   07/15/2017, 1:27 PM

## 2017-08-07 ENCOUNTER — Encounter

## 2017-10-08 ENCOUNTER — Other Ambulatory Visit: Payer: Self-pay

## 2017-10-08 DIAGNOSIS — Z1239 Encounter for other screening for malignant neoplasm of breast: Secondary | ICD-10-CM

## 2017-11-17 ENCOUNTER — Ambulatory Visit (INDEPENDENT_AMBULATORY_CARE_PROVIDER_SITE_OTHER): Admitting: Physician Assistant

## 2017-11-17 ENCOUNTER — Encounter: Payer: Self-pay | Admitting: Physician Assistant

## 2017-11-17 VITALS — BP 116/78 | HR 80 | Temp 99.5°F | Ht 63.0 in | Wt 199.6 lb

## 2017-11-17 DIAGNOSIS — S060X9A Concussion with loss of consciousness of unspecified duration, initial encounter: Secondary | ICD-10-CM | POA: Insufficient documentation

## 2017-11-17 DIAGNOSIS — E1165 Type 2 diabetes mellitus with hyperglycemia: Secondary | ICD-10-CM | POA: Diagnosis not present

## 2017-11-17 DIAGNOSIS — I2510 Atherosclerotic heart disease of native coronary artery without angina pectoris: Secondary | ICD-10-CM

## 2017-11-17 DIAGNOSIS — S060X9D Concussion with loss of consciousness of unspecified duration, subsequent encounter: Secondary | ICD-10-CM | POA: Diagnosis not present

## 2017-11-17 DIAGNOSIS — E782 Mixed hyperlipidemia: Secondary | ICD-10-CM | POA: Diagnosis not present

## 2017-11-17 DIAGNOSIS — G4459 Other complicated headache syndrome: Secondary | ICD-10-CM | POA: Diagnosis not present

## 2017-11-17 LAB — BAYER DCA HB A1C WAIVED: HB A1C (BAYER DCA - WAIVED): 6.5 % (ref <7.0)

## 2017-11-17 MED ORDER — ALPRAZOLAM 1 MG PO TABS: 1.0000 mg | ORAL_TABLET | Freq: Two times a day (BID) | ORAL | 1 refills | Status: DC | PRN

## 2017-11-17 MED ORDER — CLINDAMYCIN HCL 300 MG PO CAPS: 300.0000 mg | ORAL_CAPSULE | Freq: Three times a day (TID) | ORAL | 2 refills | Status: DC

## 2017-11-17 NOTE — Patient Instructions (Signed)
In a few days you may receive a survey in the mail or online from [employer] regarding your visit with us today. Please take a moment to fill this out. Your feedback is very important to our whole office. It can help us better understand your needs as well as improve your experience and satisfaction. Thank you for taking your time to complete it. We care about you.  Danel Requena, PA-C  

## 2017-11-18 LAB — MICROALBUMIN / CREATININE URINE RATIO
Creatinine, Urine: 188 mg/dL
MICROALBUM., U, RANDOM: 20.6 ug/mL
Microalb/Creat Ratio: 11 mg/g creat (ref 0.0–30.0)

## 2017-11-18 LAB — CMP14+EGFR
A/G RATIO: 1.7 (ref 1.2–2.2)
ALBUMIN: 3.9 g/dL (ref 3.5–5.5)
ALT: 11 IU/L (ref 0–32)
AST: 13 IU/L (ref 0–40)
Alkaline Phosphatase: 67 IU/L (ref 39–117)
BILIRUBIN TOTAL: 0.5 mg/dL (ref 0.0–1.2)
BUN / CREAT RATIO: 13 (ref 9–23)
BUN: 12 mg/dL (ref 6–24)
CHLORIDE: 101 mmol/L (ref 96–106)
CO2: 26 mmol/L (ref 20–29)
Calcium: 9.2 mg/dL (ref 8.7–10.2)
Creatinine, Ser: 0.95 mg/dL (ref 0.57–1.00)
GFR calc non Af Amer: 67 mL/min/{1.73_m2} (ref >59)
GFR, EST AFRICAN AMERICAN: 77 mL/min/{1.73_m2} (ref >59)
GLOBULIN, TOTAL: 2.3 g/dL (ref 1.5–4.5)
GLUCOSE: 144 mg/dL — AB (ref 65–99)
Potassium: 4 mmol/L (ref 3.5–5.2)
Sodium: 143 mmol/L (ref 134–144)
TOTAL PROTEIN: 6.2 g/dL (ref 6.0–8.5)

## 2017-11-18 LAB — CBC WITH DIFFERENTIAL/PLATELET
BASOS ABS: 0 10*3/uL (ref 0.0–0.2)
Basos: 1 %
EOS (ABSOLUTE): 0.2 10*3/uL (ref 0.0–0.4)
Eos: 4 %
HEMATOCRIT: 38.1 % (ref 34.0–46.6)
HEMOGLOBIN: 12.5 g/dL (ref 11.1–15.9)
Immature Grans (Abs): 0 10*3/uL (ref 0.0–0.1)
Immature Granulocytes: 0 %
LYMPHS ABS: 1.1 10*3/uL (ref 0.7–3.1)
Lymphs: 22 %
MCH: 30.6 pg (ref 26.6–33.0)
MCHC: 32.8 g/dL (ref 31.5–35.7)
MCV: 93 fL (ref 79–97)
MONOCYTES: 6 %
Monocytes Absolute: 0.3 10*3/uL (ref 0.1–0.9)
NEUTROS ABS: 3.3 10*3/uL (ref 1.4–7.0)
Neutrophils: 67 %
Platelets: 236 10*3/uL (ref 150–450)
RBC: 4.09 x10E6/uL (ref 3.77–5.28)
RDW: 13 % (ref 12.3–15.4)
WBC: 5 10*3/uL (ref 3.4–10.8)

## 2017-11-18 LAB — LIPID PANEL
CHOLESTEROL TOTAL: 219 mg/dL — AB (ref 100–199)
Chol/HDL Ratio: 5.5 ratio — ABNORMAL HIGH (ref 0.0–4.4)
HDL: 40 mg/dL (ref >39)
LDL Calculated: 137 mg/dL — ABNORMAL HIGH (ref 0–99)
Triglycerides: 212 mg/dL — ABNORMAL HIGH (ref 0–149)
VLDL Cholesterol Cal: 42 mg/dL — ABNORMAL HIGH (ref 5–40)
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
## 2017-12-17 ENCOUNTER — Encounter: Payer: Self-pay | Admitting: *Deleted

## 2017-12-17 ENCOUNTER — Telehealth: Payer: Self-pay | Admitting: *Deleted

## 2017-12-17 NOTE — Telephone Encounter (Signed)
Aware of current lab results. 

## 2017-12-18 ENCOUNTER — Other Ambulatory Visit: Payer: Self-pay

## 2017-12-18 NOTE — Patient Outreach (Signed)
[facility]) Care Management  12/18/2017  Natalie Tanner 31-May-1960 [id number]   Medication Adherence call to Mrs. Natalie Tanner left a message for patient to call back patient is due on Metformin 500 mg. Mrs. Natalie Tanner is showing past due under Natalie Tanner.   [facility] Management Direct Dial [phone]  Fax [phone] [email or web address]

## 2017-12-24 ENCOUNTER — Other Ambulatory Visit: Payer: Self-pay | Admitting: Physician Assistant

## 2017-12-25 NOTE — Telephone Encounter (Signed)
Last seen 11/17/17  Natalie Tanner

## 2018-02-17 ENCOUNTER — Ambulatory Visit: Admitting: Physician Assistant

## 2018-02-18 ENCOUNTER — Encounter: Payer: Self-pay | Admitting: Physician Assistant

## 2018-03-18 ENCOUNTER — Ambulatory Visit (INDEPENDENT_AMBULATORY_CARE_PROVIDER_SITE_OTHER): Admitting: Physician Assistant

## 2018-03-18 ENCOUNTER — Encounter: Payer: Self-pay | Admitting: Physician Assistant

## 2018-03-18 VITALS — BP 118/69 | HR 66 | Temp 96.8°F | Ht 63.0 in | Wt 206.0 lb

## 2018-03-18 DIAGNOSIS — E782 Mixed hyperlipidemia: Secondary | ICD-10-CM | POA: Diagnosis not present

## 2018-03-18 DIAGNOSIS — E1165 Type 2 diabetes mellitus with hyperglycemia: Secondary | ICD-10-CM | POA: Diagnosis not present

## 2018-03-18 DIAGNOSIS — I2510 Atherosclerotic heart disease of native coronary artery without angina pectoris: Secondary | ICD-10-CM | POA: Diagnosis not present

## 2018-03-18 LAB — BAYER DCA HB A1C WAIVED: HB A1C (BAYER DCA - WAIVED): 6.5 % (ref <7.0)

## 2018-03-18 MED ORDER — PIOGLITAZONE HCL 30 MG PO TABS: 30.0000 mg | ORAL_TABLET | Freq: Every day | ORAL | 1 refills | Status: DC

## 2018-03-18 MED ORDER — SITAGLIPTIN PHOSPHATE 100 MG PO TABS: 100.0000 mg | ORAL_TABLET | Freq: Every day | ORAL | 1 refills | Status: DC

## 2018-03-18 MED ORDER — ISOSORBIDE MONONITRATE ER 30 MG PO TB24: 30.0000 mg | ORAL_TABLET | Freq: Two times a day (BID) | ORAL | 3 refills | Status: DC

## 2018-03-18 MED ORDER — OMEPRAZOLE MAGNESIUM 20 MG PO TBEC: 20.0000 mg | DELAYED_RELEASE_TABLET | Freq: Every day | ORAL | 3 refills | Status: DC

## 2018-03-18 MED ORDER — GLIPIZIDE 10 MG PO TABS: 10.0000 mg | ORAL_TABLET | Freq: Every day | ORAL | 3 refills | Status: DC

## 2018-03-18 MED ORDER — METOPROLOL TARTRATE 50 MG PO TABS: 50.0000 mg | ORAL_TABLET | Freq: Two times a day (BID) | ORAL | 3 refills | Status: DC

## 2018-03-18 MED ORDER — ALPRAZOLAM 1 MG PO TABS: 1.0000 mg | ORAL_TABLET | Freq: Two times a day (BID) | ORAL | 1 refills | Status: DC | PRN

## 2018-03-18 MED ORDER — PRAVASTATIN SODIUM 40 MG PO TABS: 40.0000 mg | ORAL_TABLET | Freq: Every day | ORAL | 3 refills | Status: DC

## 2018-03-18 MED ORDER — EMPAGLIFLOZIN 25 MG PO TABS: 25.0000 mg | ORAL_TABLET | Freq: Every day | ORAL | 3 refills | Status: DC

## 2018-03-18 MED ORDER — LISINOPRIL 40 MG PO TABS: 40.0000 mg | ORAL_TABLET | Freq: Every day | ORAL | 3 refills | Status: DC

## 2018-03-18 MED ORDER — PAROXETINE HCL 20 MG PO TABS: 20.0000 mg | ORAL_TABLET | ORAL | 3 refills | Status: DC

## 2018-03-19 LAB — CMP14+EGFR
ALT: 15 IU/L (ref 0–32)
AST: 16 IU/L (ref 0–40)
Albumin/Globulin Ratio: 1.6 (ref 1.2–2.2)
Albumin: 3.7 g/dL (ref 3.5–5.5)
Alkaline Phosphatase: 62 IU/L (ref 39–117)
BUN/Creatinine Ratio: 13 (ref 9–23)
BUN: 12 mg/dL (ref 6–24)
Bilirubin Total: 0.7 mg/dL (ref 0.0–1.2)
CO2: 25 mmol/L (ref 20–29)
Calcium: 9.1 mg/dL (ref 8.7–10.2)
Chloride: 101 mmol/L (ref 96–106)
Creatinine, Ser: 0.89 mg/dL (ref 0.57–1.00)
GFR calc Af Amer: 84 mL/min/{1.73_m2} (ref >59)
GFR, EST NON AFRICAN AMERICAN: 73 mL/min/{1.73_m2} (ref >59)
Globulin, Total: 2.3 g/dL (ref 1.5–4.5)
Glucose: 111 mg/dL — ABNORMAL HIGH (ref 65–99)
POTASSIUM: 4 mmol/L (ref 3.5–5.2)
Sodium: 143 mmol/L (ref 134–144)
Total Protein: 6 g/dL (ref 6.0–8.5)

## 2018-03-19 LAB — CBC WITH DIFFERENTIAL/PLATELET
Basophils Absolute: 0.1 10*3/uL (ref 0.0–0.2)
Basos: 1 %
EOS (ABSOLUTE): 0.3 10*3/uL (ref 0.0–0.4)
Eos: 6 %
Hematocrit: 38.4 % (ref 34.0–46.6)
Hemoglobin: 12.6 g/dL (ref 11.1–15.9)
Immature Grans (Abs): 0 10*3/uL (ref 0.0–0.1)
Immature Granulocytes: 0 %
Lymphocytes Absolute: 1.4 10*3/uL (ref 0.7–3.1)
Lymphs: 27 %
MCH: 28.3 pg (ref 26.6–33.0)
MCHC: 32.8 g/dL (ref 31.5–35.7)
MCV: 86 fL (ref 79–97)
MONOS ABS: 0.4 10*3/uL (ref 0.1–0.9)
Monocytes: 7 %
Neutrophils Absolute: 3 10*3/uL (ref 1.4–7.0)
Neutrophils: 59 %
Platelets: 215 10*3/uL (ref 150–450)
RBC: 4.45 x10E6/uL (ref 3.77–5.28)
RDW: 13.7 % (ref 11.7–15.4)
WBC: 5.1 10*3/uL (ref 3.4–10.8)

## 2018-03-19 LAB — LIPID PANEL
Chol/HDL Ratio: 4.7 ratio — ABNORMAL HIGH (ref 0.0–4.4)
Cholesterol, Total: 204 mg/dL — ABNORMAL HIGH (ref 100–199)
HDL: 43 mg/dL (ref >39)
LDL Calculated: 123 mg/dL — ABNORMAL HIGH (ref 0–99)
Triglycerides: 189 mg/dL — ABNORMAL HIGH (ref 0–149)
VLDL Cholesterol Cal: 38 mg/dL (ref 5–40)

## 2018-03-20 NOTE — Progress Notes (Signed)
BP 118/69   Pulse 66   Temp (!) 96.8 F (36 C) (Oral)   Ht 5' 3"  (1.6 m)   Wt 206 lb (93.4 kg)   BMI 36.49 kg/m    Subjective:    Patient ID: Natalie Tanner, female    DOB: 1960/09/25, 58 y.o.   MRN: [id number]  HPI: Natalie Tanner is a 58 y.o. female presenting on 03/18/2018 for Medical Management of Chronic Issues (3 month ); Hypertension; Hyperlipidemia; and Diabetes  This patient comes in for her 27-monthrecheck: Diabetes, hypertension, hyperlipidemia.  She reports overall that she feels like she is doing fairly well.  She has had very good readings.  She has not had any extremely high sugar readings.  She is trying to do fairly well with her weight loss.  She plans on joining weight watchers.  She has no other complaints at this time.  Past Medical History:  Diagnosis Date  . Anxiety   . Coronary atherosclerosis of native coronary artery    Managed medically following cardiac catheterization 5/12  . Depression   . Essential hypertension, benign   . GERD (gastroesophageal reflux disease)   . Iron deficiency anemia    Secondary to menometrorrhagia  . Mixed hyperlipidemia   . Myocardial infarction ([facility]    NSTEMI 5/12  . Nephrolithiasis   . Type 2 diabetes mellitus (HCC)    Relevant past medical, surgical, family and social history reviewed and updated as indicated. Interim medical history since our last visit reviewed. Allergies and medications reviewed and updated. DATA REVIEWED: CHART IN EPIC  Family History reviewed for pertinent findings.  Review of Systems  Constitutional: Negative.  Negative for activity change, fatigue and fever.  HENT: Negative.   Eyes: Negative.   Respiratory: Negative.  Negative for cough.   Cardiovascular: Negative.  Negative for chest pain.  Gastrointestinal: Negative.  Negative for abdominal pain.  Endocrine: Negative.   Genitourinary: Negative.  Negative for dysuria.  Musculoskeletal: Negative.   Skin: Negative.   Neurological:  Negative.     Allergies as of 03/18/2018   No Known Allergies     Medication List       Accurate as of March 18, 2018 11:59 PM. Always use your most recent med list.        ALPRAZolam 1 MG tablet Commonly known as:  XANAX Take 1 tablet (1 mg total) by mouth 2 (two) times daily as needed.   aspirin 81 MG tablet Take 81 mg by mouth daily.   blood glucose meter kit and supplies Kit Dispense based on patient and insurance preference. Use up to four times daily as directed. For diabetes E11.9, with hyperglycemia.   empagliflozin 25 MG Tabs tablet Commonly known as:  JARDIANCE Take 25 mg by mouth daily.   glipiZIDE 10 MG tablet Commonly known as:  GLUCOTROL Take 1 tablet (10 mg total) by mouth daily.   isosorbide mononitrate 30 MG 24 hr tablet Commonly known as:  IMDUR Take 1 tablet (30 mg total) by mouth 2 (two) times daily.   lisinopril 40 MG tablet Commonly known as:  PRINIVIL,ZESTRIL Take 1 tablet (40 mg total) by mouth daily.   meclizine 25 MG tablet Commonly known as:  ANTIVERT Take 1 tablet (25 mg total) by mouth 3 (three) times daily as needed for dizziness.   metoprolol tartrate 50 MG tablet Commonly known as:  LOPRESSOR Take 1 tablet (50 mg total) by mouth 2 (two) times daily.   naproxen 500 MG  tablet Commonly known as:  NAPROSYN Take 1 tablet (500 mg total) by mouth 2 (two) times daily with a meal.   nitroGLYCERIN 0.4 MG SL tablet Commonly known as:  NITROSTAT Place 1 tablet (0.4 mg total) under the tongue every 5 (five) minutes as needed for chest pain.   omeprazole 20 MG tablet Commonly known as:  PRILOSEC OTC Take 1 tablet (20 mg total) by mouth daily.   PARoxetine 20 MG tablet Commonly known as:  PAXIL Take 1 tablet (20 mg total) by mouth every morning.   pioglitazone 30 MG tablet Commonly known as:  ACTOS Take 1 tablet (30 mg total) by mouth daily.   pravastatin 40 MG tablet Commonly known as:  PRAVACHOL Take 1 tablet (40 mg total) by  mouth daily.   sitaGLIPtin 100 MG tablet Commonly known as:  JANUVIA Take 1 tablet (100 mg total) by mouth daily.          Objective:    BP 118/69   Pulse 66   Temp (!) 96.8 F (36 C) (Oral)   Ht 5' 3"  (1.6 m)   Wt 206 lb (93.4 kg)   BMI 36.49 kg/m   No Known Allergies  Wt Readings from Last 3 Encounters:  03/18/18 206 lb (93.4 kg)  11/17/17 199 lb 9.6 oz (90.5 kg)  07/14/17 193 lb (87.5 kg)    Physical Exam Constitutional:      Appearance: She is well-developed.  HENT:     Head: Normocephalic and atraumatic.     Right Ear: Tympanic membrane, ear canal and external ear normal.     Left Ear: Tympanic membrane, ear canal and external ear normal.     Nose: Nose normal. No rhinorrhea.     Mouth/Throat:     Pharynx: No oropharyngeal exudate or posterior oropharyngeal erythema.  Eyes:     Conjunctiva/sclera: Conjunctivae normal.     Pupils: Pupils are equal, round, and reactive to light.  Neck:     Musculoskeletal: Normal range of motion and neck supple.  Cardiovascular:     Rate and Rhythm: Normal rate and regular rhythm.     Heart sounds: Normal heart sounds.  Pulmonary:     Effort: Pulmonary effort is normal.     Breath sounds: Normal breath sounds.  Abdominal:     General: Bowel sounds are normal.     Palpations: Abdomen is soft.  Skin:    General: Skin is warm and dry.     Findings: No rash.  Neurological:     Mental Status: She is alert and oriented to person, place, and time.     Deep Tendon Reflexes: Reflexes are normal and symmetric.  Psychiatric:        Behavior: Behavior normal.        Thought Content: Thought content normal.        Judgment: Judgment normal.     Results for orders placed or performed in visit on 03/18/18  CBC with Differential/Platelet  Result Value Ref Range   WBC 5.1 3.4 - 10.8 x10E3/uL   RBC 4.45 3.77 - 5.28 x10E6/uL   Hemoglobin 12.6 11.1 - 15.9 g/dL   Hematocrit 38.4 34.0 - 46.6 %   MCV 86 79 - 97 fL   MCH 28.3 26.6 -  33.0 pg   MCHC 32.8 31.5 - 35.7 g/dL   RDW 13.7 11.7 - 15.4 %   Platelets 215 150 - 450 x10E3/uL   Neutrophils 59 Not Estab. %   Lymphs 27 Not  Estab. %   Monocytes 7 Not Estab. %   Eos 6 Not Estab. %   Basos 1 Not Estab. %   Neutrophils Absolute 3.0 1.4 - 7.0 x10E3/uL   Lymphocytes Absolute 1.4 0.7 - 3.1 x10E3/uL   Monocytes Absolute 0.4 0.1 - 0.9 x10E3/uL   EOS (ABSOLUTE) 0.3 0.0 - 0.4 x10E3/uL   Basophils Absolute 0.1 0.0 - 0.2 x10E3/uL   Immature Granulocytes 0 Not Estab. %   Immature Grans (Abs) 0.0 0.0 - 0.1 x10E3/uL  CMP14+EGFR  Result Value Ref Range   Glucose 111 (H) 65 - 99 mg/dL   BUN 12 6 - 24 mg/dL   Creatinine, Ser 0.89 0.57 - 1.00 mg/dL   GFR calc non Af Amer 73 >59 mL/min/1.73   GFR calc Af Amer 84 >59 mL/min/1.73   BUN/Creatinine Ratio 13 9 - 23   Sodium 143 134 - 144 mmol/L   Potassium 4.0 3.5 - 5.2 mmol/L   Chloride 101 96 - 106 mmol/L   CO2 25 20 - 29 mmol/L   Calcium 9.1 8.7 - 10.2 mg/dL   Total Protein 6.0 6.0 - 8.5 g/dL   Albumin 3.7 3.5 - 5.5 g/dL   Globulin, Total 2.3 1.5 - 4.5 g/dL   Albumin/Globulin Ratio 1.6 1.2 - 2.2   Bilirubin Total 0.7 0.0 - 1.2 mg/dL   Alkaline Phosphatase 62 39 - 117 IU/L   AST 16 0 - 40 IU/L   ALT 15 0 - 32 IU/L  Lipid panel  Result Value Ref Range   Cholesterol, Total 204 (H) 100 - 199 mg/dL   Triglycerides 189 (H) 0 - 149 mg/dL   HDL 43 >39 mg/dL   VLDL Cholesterol Cal 38 5 - 40 mg/dL   LDL Calculated 123 (H) 0 - 99 mg/dL   Chol/HDL Ratio 4.7 (H) 0.0 - 4.4 ratio  Bayer DCA Hb A1c Waived  Result Value Ref Range   HB A1C (BAYER DCA - WAIVED) 6.5 <7.0 %      Assessment & Plan:   1. Type 2 diabetes mellitus with hyperglycemia, without long-term current use of insulin (HCC) - empagliflozin (JARDIANCE) 25 MG TABS tablet; Take 25 mg by mouth daily.  Dispense: 90 tablet; Refill: 3 - glipiZIDE (GLUCOTROL) 10 MG tablet; Take 1 tablet (10 mg total) by mouth daily.  Dispense: 90 tablet; Refill: 3 - pioglitazone (ACTOS) 30  MG tablet; Take 1 tablet (30 mg total) by mouth daily.  Dispense: 90 tablet; Refill: 1 - sitaGLIPtin (JANUVIA) 100 MG tablet; Take 1 tablet (100 mg total) by mouth daily.  Dispense: 90 tablet; Refill: 1 - CBC with Differential/Platelet - CMP14+EGFR - Lipid panel - Bayer DCA Hb A1c Waived  2. Mixed hyperlipidemia - CBC with Differential/Platelet - CMP14+EGFR - Lipid panel - Bayer DCA Hb A1c Waived  3. Atherosclerosis of native coronary artery of native heart without angina pectoris Continue medications   Continue all other maintenance medications as listed above.  Follow up plan: Return in about 6 months (around 09/16/2018) for check and labs.  Educational handout given for [place] PA-C [facility] [address]  [place], [place] [address] [phone]   03/20/2018, 12:30 PM

## 2018-03-27 ENCOUNTER — Encounter: Payer: Self-pay | Admitting: *Deleted

## 2018-07-09 ENCOUNTER — Telehealth: Payer: Self-pay | Admitting: Physician Assistant

## 2018-07-24 ENCOUNTER — Ambulatory Visit: Admitting: Family Medicine

## 2018-07-24 ENCOUNTER — Encounter: Payer: Self-pay | Admitting: Family Medicine

## 2018-07-24 ENCOUNTER — Ambulatory Visit (INDEPENDENT_AMBULATORY_CARE_PROVIDER_SITE_OTHER): Admitting: Family Medicine

## 2018-07-24 ENCOUNTER — Other Ambulatory Visit: Payer: Self-pay

## 2018-07-24 VITALS — BP 96/64 | HR 61 | Temp 97.1°F | Ht 63.0 in | Wt 203.0 lb

## 2018-07-24 DIAGNOSIS — L0100 Impetigo, unspecified: Secondary | ICD-10-CM

## 2018-07-24 DIAGNOSIS — M722 Plantar fascial fibromatosis: Secondary | ICD-10-CM | POA: Diagnosis not present

## 2018-07-24 MED ORDER — PREDNISONE 20 MG PO TABS: ORAL_TABLET | ORAL | 0 refills | Status: DC

## 2018-07-24 MED ORDER — SULFAMETHOXAZOLE-TRIMETHOPRIM 800-160 MG PO TABS: 1.0000 | ORAL_TABLET | Freq: Two times a day (BID) | ORAL | 0 refills | Status: AC

## 2018-07-24 MED ORDER — MUPIROCIN CALCIUM 2 % EX CREA: 1.0000 "application " | TOPICAL_CREAM | Freq: Two times a day (BID) | CUTANEOUS | 0 refills | Status: DC

## 2018-07-24 NOTE — Progress Notes (Signed)
Subjective:    Natalie Tanner is a 58 y.o. female who presents with right foot pain. Onset of the symptoms was 3 weeks ago. Precipitating event: none known. Current symptoms include: ability to bear weight, but with some pain, pain at the heel and the proximal medial plantar foot. Aggravating factors: any weight bearing, rising after sitting and worse first thing in the morning. Symptoms have waxed and waned. Patient has had no prior foot problems. Evaluation to date: none. Treatment to date: OTC analgesics which are somewhat effective. Pt also reports a rash to her inner right fourth finger. Pt states this started about 3 weeks ago. States it was blistered at first. States the blisters then popped and the area crusted over with slight surrounding erythema. Pt states she has been using OTC neosporin with great improvement.  The following portions of the patient's history were reviewed and updated as appropriate: allergies, current medications, past family history, past medical history, past social history, past surgical history and problem list.  Review of Systems Constitutional: negative for chills, fatigue and fevers Eyes: negative Ears, nose, mouth, throat, and face: negative Respiratory: negative Cardiovascular: negative Gastrointestinal: negative Integument/breast: positive for rash Musculoskeletal:positive for right foot pain Neurological: negative Behavioral/Psych: negative    Objective:    BP 96/64   Pulse 61   Temp (!) 97.1 F (36.2 C) (Oral)   Ht 5\' 3"  (1.6 m)   Wt 203 lb (92.1 kg)   BMI 35.96 kg/m   Physical Exam  Constitutional: She is oriented to person, place, and time and well-developed, well-nourished, and in no distress. Vital signs are normal. She appears healthy.  Non-toxic appearance. She does not have a sickly appearance. No distress.  HENT:  Head: Normocephalic and atraumatic.  Eyes: Pupils are equal, round, and reactive to light. Conjunctivae are normal.   Neck: Normal range of motion. Neck supple.  Cardiovascular: Normal rate, regular rhythm, normal heart sounds and intact distal pulses. Exam reveals no gallop and no friction rub.  No murmur heard. Pulmonary/Chest: Effort normal and breath sounds normal. No respiratory distress.  Musculoskeletal:     Right lower leg: Normal.     Right foot: Normal range of motion and normal capillary refill. Tenderness present. No bony tenderness, swelling, crepitus, deformity or laceration.       Feet:  Neurological: She is alert and oriented to person, place, and time.  Skin: Skin is warm and dry. Rash noted.     Psychiatric: Mood, memory, affect and judgment normal.    Right foot:  point tenderness over the heel pad, point tenderness of the mid plantar fascia, normal microcirculation and capillary reflow. No swelling, erythema, or deformity noted.   Left foot:  normal exam, no swelling, tenderness, instability; ligaments intact, full range of motion of all ankle/foot joints   Imaging not available in office today. Pt aware if symptoms do not improve or worsen, we can send for imaging at the hospital.  Assessment:   Jeffrey was seen today for foot pain.  Diagnoses and all orders for this visit:  Plantar fasciitis, right Signs and symptoms consistent with plantar fascitis. Symptomatic care discussed. Continue Naproxen twice daily. Will do steroid burst, pt aware to monitor blood sugars closely while taking steroids. Reevaluate in 2-4 weeks, or sooner if symptoms fail to improve or worsen.  -     predniSONE (DELTASONE) 20 MG tablet; 2 po at sametime daily for 5 days  Impetigo Rash consistent with impetigo. Will treat with topical  mupirocin. If symptoms do not improve in 4 days, start oral therapy.  -     mupirocin cream (BACTROBAN) 2 %; Apply 1 application topically 2 (two) times daily. -     sulfamethoxazole-trimethoprim (BACTRIM DS) 800-160 MG tablet; Take 1 tablet by mouth 2 (two) times daily for 5  days.    Plan:    Natural history and expected course discussed. Questions answered. Neurosurgeon distributed. Home exercise plan outlined. NSAIDs per medication orders. OTC shoe inserts recommended. Follow up in 2 weeks.    The above assessment and management plan was discussed with the patient. The patient verbalized understanding of and has agreed to the management plan. Patient is aware to call the clinic if symptoms fail to improve or worsen. Patient is aware when to return to the clinic for a follow-up visit. Patient educated on when it is appropriate to go to the emergency department.   Monia Pouch, FNP-C [facility] [address] [place], [place] [address] [phone]

## 2018-07-24 NOTE — Patient Instructions (Signed)
Plantar Fasciitis      Plantar fasciitis is a painful foot condition that affects the heel. It occurs when the band of tissue that connects the toes to the heel bone (plantar fascia) becomes irritated. This can happen as the result of exercising too much or doing other repetitive activities (overuse injury).  The pain from plantar fasciitis can range from mild irritation to severe pain that makes it difficult to walk or move. The pain is usually worse in the morning after sleeping, or after sitting or lying down for a while. Pain may also be worse after long periods of walking or standing.  What are the causes?  This condition may be caused by:  Standing for long periods of time.  Wearing shoes that do not have good arch support.  Doing activities that put stress on joints (high-impact activities), including running, aerobics, and ballet.  Being overweight.  An abnormal way of walking (gait).  Tight muscles in the back of your lower leg (calf).  High arches in your feet.  Starting a new athletic activity.  What are the signs or symptoms?  The main symptom of this condition is heel pain. Pain may:  Be worse with first steps after a time of rest, especially in the morning after sleeping or after you have been sitting or lying down for a while.  Be worse after long periods of standing still.  Decrease after 30-45 minutes of activity, such as gentle walking.  How is this diagnosed?  This condition may be diagnosed based on your medical history and your symptoms. Your health care provider may ask questions about your activity level. Your health care provider will do a physical exam to check for:  A tender area on the bottom of your foot.  A high arch in your foot.  Pain when you move your foot.  Difficulty moving your foot.  You may have imaging tests to confirm the diagnosis, such as:  X-rays.  Ultrasound.  MRI.  How is this treated?  Treatment for plantar fasciitis depends on how severe your condition is.  Treatment may include:  Rest, ice, applying pressure (compression), and raising the affected foot (elevation). This may be called RICE therapy. Your health care provider may recommend RICE therapy along with over-the-counter pain medicines to manage your pain.  Exercises to stretch your calves and your plantar fascia.  A splint that holds your foot in a stretched, upward position while you sleep (night splint).  Physical therapy to relieve symptoms and prevent problems in the future.  Injections of steroid medicine (cortisone) to relieve pain and inflammation.  Stimulating your plantar fascia with electrical impulses (extracorporeal shock wave therapy). This is usually the last treatment option before surgery.  Surgery, if other treatments have not worked after 12 months.  Follow these instructions at home:      Managing pain, stiffness, and swelling  If directed, put ice on the painful area:  Put ice in a plastic bag, or use a frozen bottle of water.  Place a towel between your skin and the bag or bottle.  Roll the bottom of your foot over the bag or bottle.  Do this for 20 minutes, 2-3 times a day.  Wear athletic shoes that have air-sole or gel-sole cushions, or try wearing soft shoe inserts that are designed for plantar fasciitis.  Raise (elevate) your foot above the level of your heart while you are sitting or lying down.  Activity  Avoid activities that cause pain.   Ask your health care provider what activities are safe for you.  Do physical therapy exercises and stretches as told by your health care provider.  Try activities and forms of exercise that are easier on your joints (low-impact). Examples include swimming, water aerobics, and biking.  General instructions  Take over-the-counter and prescription medicines only as told by your health care provider.  Wear a night splint while sleeping, if told by your health care provider. Loosen the splint if your toes tingle, become numb, or turn cold and  blue.  Maintain a healthy weight, or work with your health care provider to lose weight as needed.  Keep all follow-up visits as told by your health care provider. This is important.  Contact a health care provider if you:  Have symptoms that do not go away after caring for yourself at home.  Have pain that gets worse.  Have pain that affects your ability to move or do your daily activities.  Summary  Plantar fasciitis is a painful foot condition that affects the heel. It occurs when the band of tissue that connects the toes to the heel bone (plantar fascia) becomes irritated.  The main symptom of this condition is heel pain that may be worse after exercising too much or standing still for a long time.  Treatment varies, but it usually starts with rest, ice, compression, and elevation (RICE therapy) and over-the-counter medicines to manage pain.  This information is not intended to replace advice given to you by your health care provider. Make sure you discuss any questions you have with your health care provider.  Document Released: 11/13/2000 Document Revised: 12/16/2016 Document Reviewed: 12/16/2016  [employer]vier Interactive Patient Education © 2019 [employer]

## 2018-08-31 ENCOUNTER — Ambulatory Visit (INDEPENDENT_AMBULATORY_CARE_PROVIDER_SITE_OTHER): Admitting: Family Medicine

## 2018-08-31 ENCOUNTER — Encounter: Payer: Self-pay | Admitting: Family Medicine

## 2018-08-31 ENCOUNTER — Other Ambulatory Visit: Payer: Self-pay

## 2018-08-31 DIAGNOSIS — R6889 Other general symptoms and signs: Secondary | ICD-10-CM | POA: Diagnosis not present

## 2018-08-31 DIAGNOSIS — Z20822 Contact with and (suspected) exposure to covid-19: Secondary | ICD-10-CM

## 2018-08-31 NOTE — Progress Notes (Signed)
Virtual Visit via telephone Note Due to COVID-19, visit is conducted virtually and was requested by patient. This visit type was conducted due to national recommendations for restrictions regarding the COVID-19 Pandemic (e.g. social distancing) in an effort to limit this patient's exposure and mitigate transmission in our community. All issues noted in this document were discussed and addressed.  A physical exam was not performed with this format.   I connected with Natalie Tanner on 08/31/18 at 1600 by telephone and verified that I am speaking with the correct person using two identifiers. Natalie Tanner is currently located at home and no one is currently with them during visit. The provider, Monia Pouch, FNP is located in their office at time of visit.  I discussed the limitations, risks, security and privacy concerns of performing an evaluation and management service by telephone and the availability of in person appointments. I also discussed with the patient that there may be a patient responsible charge related to this service. The patient expressed understanding and agreed to proceed.  Subjective:  Patient ID: Natalie Tanner, female    DOB: 05-02-1960, 58 y.o.   MRN: [id number]  Chief Complaint:  URI   HPI: Natalie Tanner is a 58 y.o. female presenting on 08/31/2018 for URI   Pt reports cough, congestion, sore throat, headache, and rhinorrhea since Saturday. Pt states she was exposed to a COVID-19 positive pt on Monday and her symptoms started Saturday. States she has not had a fever or chills. No recent travel.   URI  This is a new problem. The current episode started in the past 7 days. The problem has been gradually worsening. There has been no fever. Associated symptoms include congestion, coughing, headaches, a plugged ear sensation, rhinorrhea and a sore throat. Pertinent negatives include no abdominal pain, chest pain, diarrhea, dysuria, ear pain, joint pain, joint swelling,  nausea, neck pain, rash, sinus pain, sneezing, swollen glands, vomiting or wheezing. She has tried nothing for the symptoms.     Relevant past medical, surgical, family, and social history reviewed and updated as indicated.  Allergies and medications reviewed and updated.   Past Medical History:  Diagnosis Date  . Anxiety   . Coronary atherosclerosis of native coronary artery    Managed medically following cardiac catheterization 5/12  . Depression   . Essential hypertension, benign   . GERD (gastroesophageal reflux disease)   . Iron deficiency anemia    Secondary to menometrorrhagia  . Mixed hyperlipidemia   . Myocardial infarction [facility]    NSTEMI 5/12  . Nephrolithiasis   . Type 2 diabetes mellitus ([place])     Past Surgical History:  Procedure Laterality Date  . BACK SURGERY    . KNEE SURGERY    . SINUS SURGERY WITH INSTATRAK      Social History   Socioeconomic History  . Marital status: Married    Spouse name: Not on file  . Number of children: 2  . Years of education: 32  . Highest education level: Not on file  Occupational History  . Occupation: [employer]  Social Needs  . Financial resource strain: Not on file  . Food insecurity    Worry: Not on file    Inability: Not on file  . Transportation needs    Medical: Not on file    Non-medical: Not on file  Tobacco Use  . Smoking status: Former Smoker    Packs/day: 1.00    Years: 20.00    Pack years: 20.00  Types: Cigarettes    Quit date: 03/04/2001    Years since quitting: 17.5  . Smokeless tobacco: Never Used  Substance and Sexual Activity  . Alcohol use: No  . Drug use: No  . Sexual activity: Yes  Lifestyle  . Physical activity    Days per week: Not on file    Minutes per session: Not on file  . Stress: Not on file  Relationships  . Social Herbalist on phone: Not on file    Gets together: Not on file    Attends religious service: Not on file    Active member of club or organization:  Not on file    Attends meetings of clubs or organizations: Not on file    Relationship status: Not on file  . Intimate partner violence    Fear of current or ex partner: Not on file    Emotionally abused: Not on file    Physically abused: Not on file    Forced sexual activity: Not on file  Other Topics Concern  . Not on file  Social History Narrative   Drinks about 2L of soda a day     Outpatient Encounter Medications as of 08/31/2018  Medication Sig  . ALPRAZolam (XANAX) 1 MG tablet Take 1 tablet (1 mg total) by mouth 2 (two) times daily as needed.  Marland Kitchen aspirin 81 MG tablet Take 81 mg by mouth daily.    . blood glucose meter kit and supplies KIT Dispense based on patient and insurance preference. Use up to four times daily as directed. For diabetes E11.9, with hyperglycemia.  Marland Kitchen empagliflozin (JARDIANCE) 25 MG TABS tablet Take 25 mg by mouth daily.  Marland Kitchen glipiZIDE (GLUCOTROL) 10 MG tablet Take 1 tablet (10 mg total) by mouth daily.  . isosorbide mononitrate (IMDUR) 30 MG 24 hr tablet Take 1 tablet (30 mg total) by mouth 2 (two) times daily.  Marland Kitchen lisinopril (PRINIVIL,ZESTRIL) 40 MG tablet Take 1 tablet (40 mg total) by mouth daily.  . meclizine (ANTIVERT) 25 MG tablet Take 1 tablet (25 mg total) by mouth 3 (three) times daily as needed for dizziness.  . metoprolol tartrate (LOPRESSOR) 50 MG tablet Take 1 tablet (50 mg total) by mouth 2 (two) times daily.  . mupirocin cream (BACTROBAN) 2 % Apply 1 application topically 2 (two) times daily.  . naproxen (NAPROSYN) 500 MG tablet Take 1 tablet (500 mg total) by mouth 2 (two) times daily with a meal.  . nitroGLYCERIN (NITROSTAT) 0.4 MG SL tablet Place 1 tablet (0.4 mg total) under the tongue every 5 (five) minutes as needed for chest pain.  Marland Kitchen omeprazole (PRILOSEC OTC) 20 MG tablet Take 1 tablet (20 mg total) by mouth daily.  Marland Kitchen PARoxetine (PAXIL) 20 MG tablet Take 1 tablet (20 mg total) by mouth every morning.  . pioglitazone (ACTOS) 30 MG tablet Take  1 tablet (30 mg total) by mouth daily.  . pravastatin (PRAVACHOL) 40 MG tablet Take 1 tablet (40 mg total) by mouth daily.  . predniSONE (DELTASONE) 20 MG tablet 2 po at sametime daily for 5 days  . sitaGLIPtin (JANUVIA) 100 MG tablet Take 1 tablet (100 mg total) by mouth daily.   No facility-administered encounter medications on file as of 08/31/2018.     No Known Allergies  Review of Systems  Constitutional: Positive for fatigue. Negative for chills and fever.  HENT: Positive for congestion, rhinorrhea, sore throat and voice change. Negative for ear pain, sinus pressure, sinus pain  and sneezing.   Eyes: Negative for photophobia and visual disturbance.  Respiratory: Positive for cough. Negative for chest tightness and wheezing.   Cardiovascular: Negative for chest pain.  Gastrointestinal: Negative for abdominal pain, diarrhea, nausea and vomiting.  Genitourinary: Negative for decreased urine volume, difficulty urinating and dysuria.  Musculoskeletal: Positive for myalgias. Negative for arthralgias, joint pain and neck pain.  Skin: Negative for rash.  Neurological: Positive for headaches. Negative for dizziness and weakness.  Psychiatric/Behavioral: Negative for confusion.  All other systems reviewed and are negative.        Observations/Objective: No vital signs or physical exam, this was a telephone or virtual health encounter.  Pt alert and oriented, answers all questions appropriately, and able to speak in full sentences.    Assessment and Plan: Natalie Tanner was seen today for uri.  Diagnoses and all orders for this visit:  Suspected Covid-19 Virus Infection Pt exposed to COVID-19 and now has symptoms. Pt aware to self quarantine until symptom free for 72 hours without medications. Symptomatic care discussed. Report any new or worsening symptoms. Pt aware of symptoms that require emergent evaluation.  -     MyChart COVID-19 home monitoring program; Future -     Temperature  monitoring; Future     Follow Up Instructions: Return if symptoms worsen or fail to improve.    I discussed the assessment and treatment plan with the patient. The patient was provided an opportunity to ask questions and all were answered. The patient agreed with the plan and demonstrated an understanding of the instructions.   The patient was advised to call back or seek an in-person evaluation if the symptoms worsen or if the condition fails to improve as anticipated.  The above assessment and management plan was discussed with the patient. The patient verbalized understanding of and has agreed to the management plan. Patient is aware to call the clinic if symptoms persist or worsen. Patient is aware when to return to the clinic for a follow-up visit. Patient educated on when it is appropriate to go to the emergency department.    I provided 15 minutes of non-face-to-face time during this encounter. The call started at 1600. The call ended at 1615. The other time was used for coordination of care.    Monia Pouch, FNP-C [facility] [address] [place],  [address] [phone]

## 2018-09-01 ENCOUNTER — Telehealth: Payer: Self-pay | Admitting: *Deleted

## 2018-09-01 ENCOUNTER — Other Ambulatory Visit

## 2018-09-01 DIAGNOSIS — Z20822 Contact with and (suspected) exposure to covid-19: Secondary | ICD-10-CM

## 2018-09-01 NOTE — Telephone Encounter (Signed)
Left voicemail for patient to call back to schedule COVID 19 test.  Test ordered.

## 2018-09-01 NOTE — Telephone Encounter (Signed)
-----   Message from Baruch Gouty, FNP sent at 08/31/2018  4:07 PM EDT ----- Pt exposed to COVID-19 positive pt last Monday. Pt developed symptoms Saturday. URI symptoms with cough and headache.   Natalie Tanner MR # [id number]

## 2018-09-09 LAB — NOVEL CORONAVIRUS, NAA: SARS-CoV-2, NAA: NOT DETECTED

## 2018-09-14 ENCOUNTER — Telehealth: Payer: Self-pay | Admitting: Physician Assistant

## 2018-09-14 NOTE — Telephone Encounter (Signed)
Ok to provide

## 2018-09-16 ENCOUNTER — Encounter: Payer: Self-pay | Admitting: Physician Assistant

## 2018-09-16 ENCOUNTER — Ambulatory Visit (INDEPENDENT_AMBULATORY_CARE_PROVIDER_SITE_OTHER): Admitting: Physician Assistant

## 2018-09-16 ENCOUNTER — Other Ambulatory Visit: Payer: Self-pay

## 2018-09-16 VITALS — BP 110/69 | HR 81 | Temp 97.0°F | Ht 63.0 in | Wt 206.2 lb

## 2018-09-16 DIAGNOSIS — M67971 Unspecified disorder of synovium and tendon, right ankle and foot: Secondary | ICD-10-CM | POA: Insufficient documentation

## 2018-09-16 DIAGNOSIS — F419 Anxiety disorder, unspecified: Secondary | ICD-10-CM

## 2018-09-16 DIAGNOSIS — E1165 Type 2 diabetes mellitus with hyperglycemia: Secondary | ICD-10-CM

## 2018-09-16 DIAGNOSIS — I10 Essential (primary) hypertension: Secondary | ICD-10-CM | POA: Diagnosis not present

## 2018-09-16 DIAGNOSIS — M722 Plantar fascial fibromatosis: Secondary | ICD-10-CM

## 2018-09-16 LAB — BAYER DCA HB A1C WAIVED: HB A1C (BAYER DCA - WAIVED): 6.1 % (ref <7.0)

## 2018-09-16 MED ORDER — NITROGLYCERIN 0.4 MG SL SUBL: 0.4000 mg | SUBLINGUAL_TABLET | SUBLINGUAL | 2 refills | Status: DC | PRN

## 2018-09-16 MED ORDER — NAPROXEN 500 MG PO TABS: 500.0000 mg | ORAL_TABLET | Freq: Two times a day (BID) | ORAL | 3 refills | Status: DC

## 2018-09-16 MED ORDER — ALPRAZOLAM 1 MG PO TABS: 1.0000 mg | ORAL_TABLET | Freq: Two times a day (BID) | ORAL | 1 refills | Status: DC | PRN

## 2018-09-16 MED ORDER — PIOGLITAZONE HCL 30 MG PO TABS: 30.0000 mg | ORAL_TABLET | Freq: Every day | ORAL | 1 refills | Status: DC

## 2018-09-16 MED ORDER — SITAGLIPTIN PHOSPHATE 100 MG PO TABS: 100.0000 mg | ORAL_TABLET | Freq: Every day | ORAL | 1 refills | Status: DC

## 2018-09-17 LAB — CBC WITH DIFFERENTIAL/PLATELET
Basophils Absolute: 0 10*3/uL (ref 0.0–0.2)
Basos: 1 %
EOS (ABSOLUTE): 0.3 10*3/uL (ref 0.0–0.4)
Eos: 5 %
Hematocrit: 41.1 % (ref 34.0–46.6)
Hemoglobin: 13.5 g/dL (ref 11.1–15.9)
Immature Grans (Abs): 0 10*3/uL (ref 0.0–0.1)
Immature Granulocytes: 0 %
Lymphocytes Absolute: 1.8 10*3/uL (ref 0.7–3.1)
Lymphs: 29 %
MCH: 29.8 pg (ref 26.6–33.0)
MCHC: 32.8 g/dL (ref 31.5–35.7)
MCV: 91 fL (ref 79–97)
Monocytes Absolute: 0.6 10*3/uL (ref 0.1–0.9)
Monocytes: 9 %
Neutrophils Absolute: 3.6 10*3/uL (ref 1.4–7.0)
Neutrophils: 56 %
Platelets: 268 10*3/uL (ref 150–450)
RBC: 4.53 x10E6/uL (ref 3.77–5.28)
RDW: 13 % (ref 11.7–15.4)
WBC: 6.4 10*3/uL (ref 3.4–10.8)

## 2018-09-17 LAB — CMP14+EGFR
ALT: 11 IU/L (ref 0–32)
AST: 13 IU/L (ref 0–40)
Albumin/Globulin Ratio: 1.6 (ref 1.2–2.2)
Albumin: 4.1 g/dL (ref 3.8–4.9)
Alkaline Phosphatase: 65 IU/L (ref 39–117)
BUN/Creatinine Ratio: 16 (ref 9–23)
BUN: 21 mg/dL (ref 6–24)
Bilirubin Total: 0.7 mg/dL (ref 0.0–1.2)
CO2: 23 mmol/L (ref 20–29)
Calcium: 10.3 mg/dL — ABNORMAL HIGH (ref 8.7–10.2)
Chloride: 101 mmol/L (ref 96–106)
Creatinine, Ser: 1.35 mg/dL — ABNORMAL HIGH (ref 0.57–1.00)
GFR calc Af Amer: 50 mL/min/{1.73_m2} — ABNORMAL LOW (ref >59)
GFR calc non Af Amer: 44 mL/min/{1.73_m2} — ABNORMAL LOW (ref >59)
Globulin, Total: 2.5 g/dL (ref 1.5–4.5)
Glucose: 164 mg/dL — ABNORMAL HIGH (ref 65–99)
Potassium: 4.5 mmol/L (ref 3.5–5.2)
Sodium: 141 mmol/L (ref 134–144)
Total Protein: 6.6 g/dL (ref 6.0–8.5)

## 2018-09-17 LAB — LIPID PANEL
Chol/HDL Ratio: 3.2 ratio (ref 0.0–4.4)
Cholesterol, Total: 178 mg/dL (ref 100–199)
HDL: 56 mg/dL (ref >39)
LDL Calculated: 97 mg/dL (ref 0–99)
Triglycerides: 125 mg/dL (ref 0–149)
VLDL Cholesterol Cal: 25 mg/dL (ref 5–40)

## 2018-09-17 LAB — TSH: TSH: 2.73 u[IU]/mL (ref 0.450–4.500)

## 2018-09-17 NOTE — Progress Notes (Signed)
BP 110/69   Pulse 81   Temp (!) 97 F (36.1 C) (Oral)   Ht _0  (1.6 m)   Wt 206 lb 3.2 oz (93.5 kg)   BMI 36.53 kg/m    Subjective:    Patient ID: Natalie Tanner, female    DOB: 02/04/1961, 58 y.o.   MRN: [id number]  HPI: Natalie Tanner is a 58 y.o. female presenting on 09/16/2018 for Medical Management of Chronic Issues (6 month ), Hyperlipidemia, and Diabetes  This patient comes in for recheck on her chronic medical conditions which do include diabetes, hyperlipidemia, anxiety, hypertension.  All of her medications will be refilled.  Labs will be performed.  In addition she is complaining with her right heel still bothering her.  Is been many weeks.  She wears good shoes to work all the time.  She is tried some medication it does, down.  When she stops it flares back up again.  It is the most pain whenever she stretches the foot up in the morning and after she has had still for a long time.  We have discussed plantar fasciitis of the.  I given her some exercises today we will continue his medication.  We will consider immobilization boot.  We will also plan for podiatry appointment.  Past Medical History:  Diagnosis Date  . Anxiety   . Coronary atherosclerosis of native coronary artery    Managed medically following cardiac catheterization 5/12  . Depression   . Essential hypertension, benign   . GERD (gastroesophageal reflux disease)   . Iron deficiency anemia    Secondary to menometrorrhagia  . Mixed hyperlipidemia   . Myocardial infarction [facility])    NSTEMI 5/12  . Nephrolithiasis   . Type 2 diabetes mellitus (H[facility])    Relevant past medical, surgical, family and social history reviewed and updated as indicated. Interim medical history since our last visit reviewed. Allergies and medications reviewed and updated. DATA REVIEWED: CHART IN EPIC  Family History reviewed for pertinent findings.  Review of Systems  Constitutional: Negative.   HENT: Negative.   Eyes: Negative.    Respiratory: Negative.   Gastrointestinal: Negative.   Genitourinary: Negative.   Musculoskeletal: Positive for arthralgias and joint swelling.    Allergies as of 09/16/2018   No Known Allergies     Medication List       Accurate as of September 16, 2018 11:59 PM. If you have any questions, ask your nurse or doctor.        STOP taking these medications   mupirocin cream 2 % Commonly known as: BACTROBAN Stopped by: Terald Sleeper, PA-C   predniSONE 20 MG tablet Commonly known as: Deltasone Stopped by: Terald Sleeper, PA-C     TAKE these medications   ALPRAZolam 1 MG tablet Commonly known as: XANAX Take 1 tablet (1 mg total) by mouth 2 (two) times daily as needed.   aspirin 81 MG tablet Take 81 mg by mouth daily.   blood glucose meter kit and supplies Kit Dispense based on patient and insurance preference. Use up to four times daily as directed. For diabetes E11.9, with hyperglycemia.   empagliflozin 25 MG Tabs tablet Commonly known as: Jardiance Take 25 mg by mouth daily.   glipiZIDE 10 MG tablet Commonly known as: GLUCOTROL Take 1 tablet (10 mg total) by mouth daily.   isosorbide mononitrate 30 MG 24 hr tablet Commonly known as: IMDUR Take 1 tablet (30 mg total) by mouth 2 (two) times daily.  lisinopril 40 MG tablet Commonly known as: ZESTRIL Take 1 tablet (40 mg total) by mouth daily.   meclizine 25 MG tablet Commonly known as: ANTIVERT Take 1 tablet (25 mg total) by mouth 3 (three) times daily as needed for dizziness.   metoprolol tartrate 50 MG tablet Commonly known as: LOPRESSOR Take 1 tablet (50 mg total) by mouth 2 (two) times daily.   naproxen 500 MG tablet Commonly known as: Naprosyn Take 1 tablet (500 mg total) by mouth 2 (two) times daily with a meal.   nitroGLYCERIN 0.4 MG SL tablet Commonly known as: Nitrostat Place 1 tablet (0.4 mg total) under the tongue every 5 (five) minutes as needed for chest pain.   omeprazole 20 MG tablet Commonly  known as: PRILOSEC OTC Take 1 tablet (20 mg total) by mouth daily.   PARoxetine 20 MG tablet Commonly known as: PAXIL Take 1 tablet (20 mg total) by mouth every morning.   pioglitazone 30 MG tablet Commonly known as: Actos Take 1 tablet (30 mg total) by mouth daily.   pravastatin 40 MG tablet Commonly known as: PRAVACHOL Take 1 tablet (40 mg total) by mouth daily.   sitaGLIPtin 100 MG tablet Commonly known as: JANUVIA Take 1 tablet (100 mg total) by mouth daily.          Objective:    BP 110/69   Pulse 81   Temp (!) 97 F (36.1 C) (Oral)   Ht _0  (1.6 m)   Wt 206 lb 3.2 oz (93.5 kg)   BMI 36.53 kg/m   No Known Allergies  Wt Readings from Last 3 Encounters:  09/16/18 206 lb 3.2 oz (93.5 kg)  07/24/18 203 lb (92.1 kg)  03/18/18 206 lb (93.4 kg)    Physical Exam Constitutional:      Appearance: She is well-developed.  HENT:     Head: Normocephalic and atraumatic.  Eyes:     Conjunctiva/sclera: Conjunctivae normal.     Pupils: Pupils are equal, round, and reactive to light.  Cardiovascular:     Rate and Rhythm: Normal rate and regular rhythm.     Heart sounds: Normal heart sounds.  Pulmonary:     Effort: Pulmonary effort is normal.     Breath sounds: Normal breath sounds.  Abdominal:     General: Bowel sounds are normal.     Palpations: Abdomen is soft.  Musculoskeletal:     Right ankle: She exhibits deformity. Tenderness. Achilles tendon exhibits pain.       Feet:  Skin:    General: Skin is warm and dry.     Findings: No rash.  Neurological:     Mental Status: She is alert and oriented to person, place, and time.     Deep Tendon Reflexes: Reflexes are normal and symmetric.  Psychiatric:        Behavior: Behavior normal.        Thought Content: Thought content normal.        Judgment: Judgment normal.     Results for orders placed or performed in visit on 09/16/18  CBC with Differential/Platelet  Result Value Ref Range   WBC 6.4 3.4 - 10.8  x10E3/uL   RBC 4.53 3.77 - 5.28 x10E6/uL   Hemoglobin 13.5 11.1 - 15.9 g/dL   Hematocrit 41.1 34.0 - 46.6 %   MCV 91 79 - 97 fL   MCH 29.8 26.6 - 33.0 pg   MCHC 32.8 31.5 - 35.7 g/dL   RDW 13.0 11.7 - 15.4 %  Platelets 268 150 - 450 x10E3/uL   Neutrophils 56 Not Estab. %   Lymphs 29 Not Estab. %   Monocytes 9 Not Estab. %   Eos 5 Not Estab. %   Basos 1 Not Estab. %   Neutrophils Absolute 3.6 1.4 - 7.0 x10E3/uL   Lymphocytes Absolute 1.8 0.7 - 3.1 x10E3/uL   Monocytes Absolute 0.6 0.1 - 0.9 x10E3/uL   EOS (ABSOLUTE) 0.3 0.0 - 0.4 x10E3/uL   Basophils Absolute 0.0 0.0 - 0.2 x10E3/uL   Immature Granulocytes 0 Not Estab. %   Immature Grans (Abs) 0.0 0.0 - 0.1 x10E3/uL  CMP14+EGFR  Result Value Ref Range   Glucose 164 (H) 65 - 99 mg/dL   BUN 21 6 - 24 mg/dL   Creatinine, Ser 1.35 (H) 0.57 - 1.00 mg/dL   GFR calc non Af Amer 44 (L) >59 mL/min/1.73   GFR calc Af Amer 50 (L) >59 mL/min/1.73   BUN/Creatinine Ratio 16 9 - 23   Sodium 141 134 - 144 mmol/L   Potassium 4.5 3.5 - 5.2 mmol/L   Chloride 101 96 - 106 mmol/L   CO2 23 20 - 29 mmol/L   Calcium 10.3 (H) 8.7 - 10.2 mg/dL   Total Protein 6.6 6.0 - 8.5 g/dL   Albumin 4.1 3.8 - 4.9 g/dL   Globulin, Total 2.5 1.5 - 4.5 g/dL   Albumin/Globulin Ratio 1.6 1.2 - 2.2   Bilirubin Total 0.7 0.0 - 1.2 mg/dL   Alkaline Phosphatase 65 39 - 117 IU/L   AST 13 0 - 40 IU/L   ALT 11 0 - 32 IU/L  Lipid panel  Result Value Ref Range   Cholesterol, Total 178 100 - 199 mg/dL   Triglycerides 125 0 - 149 mg/dL   HDL 56 >39 mg/dL   VLDL Cholesterol Cal 25 5 - 40 mg/dL   LDL Calculated 97 0 - 99 mg/dL   Chol/HDL Ratio 3.2 0.0 - 4.4 ratio  TSH  Result Value Ref Range   TSH 2.730 0.450 - 4.500 uIU/mL  Bayer DCA Hb A1c Waived  Result Value Ref Range   HB A1C (BAYER DCA - WAIVED) 6.1 <7.0 %      Assessment & Plan:   1. Plantar fasciitis, right - naproxen (NAPROSYN) 500 MG tablet; Take 1 tablet (500 mg total) by mouth 2 (two) times daily  with a meal.  Dispense: 180 tablet; Refill: 3  2. Achilles tendon disorder, right - naproxen (NAPROSYN) 500 MG tablet; Take 1 tablet (500 mg total) by mouth 2 (two) times daily with a meal.  Dispense: 180 tablet; Refill: 3  3. Type 2 diabetes mellitus with hyperglycemia, without long-term current use of insulin (HCC) - CBC with Differential/Platelet - CMP14+EGFR - Lipid panel - TSH - Bayer DCA Hb A1c Waived - pioglitazone (ACTOS) 30 MG tablet; Take 1 tablet (30 mg total) by mouth daily.  Dispense: 90 tablet; Refill: 1 - sitaGLIPtin (JANUVIA) 100 MG tablet; Take 1 tablet (100 mg total) by mouth daily.  Dispense: 90 tablet; Refill: 1  4. Essential hypertension, benign - CBC with Differential/Platelet - CMP14+EGFR - Lipid panel - TSH - Bayer DCA Hb A1c Waived  5. Anxiety - ALPRAZolam (XANAX) 1 MG tablet; Take 1 tablet (1 mg total) by mouth 2 (two) times daily as needed.  Dispense: 180 tablet; Refill: 1   Continue all other maintenance medications as listed above.  Follow up plan: Return in about 6 months (around 03/19/2019).  Educational handout given for survey  Terald Sleeper PA-C [facility] [address]  [place], [place] [address] [phone]   09/17/2018, 10:12 PM

## 2019-01-21 ENCOUNTER — Ambulatory Visit (INDEPENDENT_AMBULATORY_CARE_PROVIDER_SITE_OTHER): Admitting: Family

## 2019-01-21 ENCOUNTER — Encounter: Payer: Self-pay | Admitting: Family

## 2019-01-21 ENCOUNTER — Other Ambulatory Visit: Payer: Self-pay

## 2019-01-21 VITALS — BP 111/68 | Temp 97.0°F | Ht 63.0 in | Wt 202.4 lb

## 2019-01-21 DIAGNOSIS — M5442 Lumbago with sciatica, left side: Secondary | ICD-10-CM | POA: Diagnosis not present

## 2019-01-21 DIAGNOSIS — Z09 Encounter for follow-up examination after completed treatment for conditions other than malignant neoplasm: Secondary | ICD-10-CM | POA: Diagnosis not present

## 2019-01-21 MED ORDER — KETOROLAC TROMETHAMINE 60 MG/2ML IM SOLN
60.0000 mg | Freq: Once | INTRAMUSCULAR | Status: AC
  Administered 2019-01-21: 16:00:00 60 mg via INTRAMUSCULAR

## 2019-01-21 MED ORDER — GABAPENTIN 100 MG PO CAPS: 100.0000 mg | ORAL_CAPSULE | Freq: Three times a day (TID) | ORAL | 0 refills | Status: DC

## 2019-01-21 NOTE — Progress Notes (Signed)
Subjective:    Patient ID: Natalie Tanner, female    DOB: 1960-12-19, 58 y.o.   MRN: [id number]  Chief Complaint  Patient presents with  . Hospitalization Follow-up    ER left hip pain follow up   Pt presents to the office today with left hip and sciatic pain. She went to the ED on Tuesday night. She was given steroid injection and discharged on Motrin.  Back Pain This is a new problem. The current episode started in the past 7 days. The problem occurs intermittently. The problem has been waxing and waning since onset. The pain is present in the gluteal and lumbar spine. The quality of the pain is described as aching. The pain radiates to the left thigh. The pain is at a severity of 8/10. The pain is moderate. The pain is worse during the night. The symptoms are aggravated by bending and twisting. Associated symptoms include leg pain, numbness, tingling and weakness. Pertinent negatives include no bladder incontinence or bowel incontinence. Risk factors include obesity. She has tried NSAIDs and bed rest for the symptoms. The treatment provided mild relief.      Review of Systems  Gastrointestinal: Negative for bowel incontinence.  Genitourinary: Negative for bladder incontinence.  Musculoskeletal: Positive for back pain.  Neurological: Positive for tingling, weakness and numbness.  All other systems reviewed and are negative.      Objective:   Physical Exam Vitals signs reviewed.  Constitutional:      General: She is not in acute distress.    Appearance: She is well-developed.  HENT:     Head: Normocephalic and atraumatic.  Eyes:     Pupils: Pupils are equal, round, and reactive to light.  Neck:     Musculoskeletal: Normal range of motion and neck supple.     Thyroid: No thyromegaly.  Cardiovascular:     Rate and Rhythm: Normal rate and regular rhythm.     Heart sounds: Normal heart sounds. No murmur.  Pulmonary:     Effort: Pulmonary effort is normal. No respiratory  distress.     Breath sounds: Normal breath sounds. No wheezing.  Abdominal:     General: Bowel sounds are normal. There is no distension.     Palpations: Abdomen is soft.     Tenderness: There is no abdominal tenderness.  Musculoskeletal:        General: No tenderness.     Comments: Pain in gluteal with flexion or extension  Skin:    General: Skin is warm and dry.  Neurological:     Mental Status: She is alert and oriented to person, place, and time.     Cranial Nerves: No cranial nerve deficit.     Deep Tendon Reflexes: Reflexes are normal and symmetric.  Psychiatric:        Behavior: Behavior normal.        Thought Content: Thought content normal.        Judgment: Judgment normal.     BP 111/68   Temp (!) 97 F (36.1 C) (Temporal)   Ht 5\' 3"  (1.6 m)   Wt 202 lb 6.4 oz (91.8 kg)   SpO2 100%   BMI 35.85 kg/m        Assessment & Plan:  Natalie Tanner comes in today with chief complaint of Hospitalization Follow-up (ER left hip pain follow up)   Diagnosis and orders addressed:  1. Acute left-sided low back pain with left-sided sciatica - gabapentin (NEURONTIN) 100 MG capsule; Take 1 capsule (  100 mg total) by mouth 3 (three) times daily.  Dispense: 90 capsule; Refill: 0 - Ambulatory referral to Physical Therapy - ketorolac (TORADOL) injection 60 mg  2. Hospital discharge follow-up  Rest Ice  ROM exercises encouraged- Handout given Continue Motrin Will hold off on steroids since glucose is elevated  Referral to PT pending Sedation precautions discussed Hospital notes reviewed and will be scanned into chart  Evelina Dun, FNP

## 2019-01-21 NOTE — Patient Instructions (Signed)
Sciatica Rehab Ask your health care provider which exercises are safe for you. Do exercises exactly as told by your health care provider and adjust them as directed. It is normal to feel mild stretching, pulling, tightness, or discomfort as you do these exercises. Stop right away if you feel sudden pain or your pain gets worse. Do not begin these exercises until told by your health care provider. Stretching and range-of-motion exercises These exercises warm up your muscles and joints and improve the movement and flexibility of your hips and back. These exercises also help to relieve pain, numbness, and tingling. Sciatic nerve glide 1. Sit in a chair with your head facing down toward your chest. Place your hands behind your back. Let your shoulders slump forward. 2. Slowly straighten one of your legs while you tilt your head back as if you are looking toward the ceiling. Only straighten your leg as far as you can without making your symptoms worse. 3. Hold this position for __________ seconds. 4. Slowly return to the starting position. 5. Repeat with your other leg. Repeat __________ times. Complete this exercise __________ times a day. Knee to chest with hip adduction and internal rotation  1. Lie on your back on a firm surface with both legs straight. 2. Bend one of your knees and move it up toward your chest until you feel a gentle stretch in your lower back and buttock. Then, move your knee toward the shoulder that is on the opposite side from your leg. This is hip adduction and internal rotation. ? Hold your leg in this position by holding on to the front of your knee. 3. Hold this position for __________ seconds. 4. Slowly return to the starting position. 5. Repeat with your other leg. Repeat __________ times. Complete this exercise __________ times a day. Prone extension on elbows  1. Lie on your abdomen on a firm surface. A bed may be too soft for this exercise. 2. Prop yourself up on  your elbows. 3. Use your arms to help lift your chest up until you feel a gentle stretch in your abdomen and your lower back. ? This will place some of your body weight on your elbows. If this is uncomfortable, try stacking pillows under your chest. ? Your hips should stay down, against the surface that you are lying on. Keep your hip and back muscles relaxed. 4. Hold this position for __________ seconds. 5. Slowly relax your upper body and return to the starting position. Repeat __________ times. Complete this exercise __________ times a day. Strengthening exercises These exercises build strength and endurance in your back. Endurance is the ability to use your muscles for a long time, even after they get tired. Pelvic tilt This exercise strengthens the muscles that lie deep in the abdomen. 1. Lie on your back on a firm surface. Bend your knees and keep your feet flat on the floor. 2. Tense your abdominal muscles. Tip your pelvis up toward the ceiling and flatten your lower back into the floor. ? To help with this exercise, you may place a small towel under your lower back and try to push your back into the towel. 3. Hold this position for __________ seconds. 4. Let your muscles relax completely before you repeat this exercise. Repeat __________ times. Complete this exercise __________ times a day. Alternating arm and leg raises  1. Get on your hands and knees on a firm surface. If you are on a hard floor, you may want to use   padding, such as an exercise mat, to cushion your knees. 2. Line up your arms and legs. Your hands should be directly below your shoulders, and your knees should be directly below your hips. 3. Lift your left leg behind you. At the same time, raise your right arm and straighten it in front of you. ? Do not lift your leg higher than your hip. ? Do not lift your arm higher than your shoulder. ? Keep your abdominal and back muscles tight. ? Keep your hips facing the  ground. ? Do not arch your back. ? Keep your balance carefully, and do not hold your breath. 4. Hold this position for __________ seconds. 5. Slowly return to the starting position. 6. Repeat with your right leg and your left arm. Repeat __________ times. Complete this exercise __________ times a day. Posture and body mechanics Good posture and healthy body mechanics can help to relieve stress in your body's tissues and joints. Body mechanics refers to the movements and positions of your body while you do your daily activities. Posture is part of body mechanics. Good posture means:  Your spine is in its natural S-curve position (neutral).  Your shoulders are pulled back slightly.  Your head is not tipped forward. Follow these guidelines to improve your posture and body mechanics in your everyday activities. Standing   When standing, keep your spine neutral and your feet about hip width apart. Keep a slight bend in your knees. Your ears, shoulders, and hips should line up.  When you do a task in which you stand in one place for a long time, place one foot up on a stable object that is 2-4 inches (5-10 cm) high, such as a footstool. This helps keep your spine neutral. Sitting   When sitting, keep your spine neutral and keep your feet flat on the floor. Use a footrest, if necessary, and keep your thighs parallel to the floor. Avoid rounding your shoulders, and avoid tilting your head forward.  When working at a desk or a computer, keep your desk at a height where your hands are slightly lower than your elbows. Slide your chair under your desk so you are close enough to maintain good posture.  When working at a computer, place your monitor at a height where you are looking straight ahead and you do not have to tilt your head forward or downward to look at the screen. Resting  When lying down and resting, avoid positions that are most painful for you.  If you have pain with activities  such as sitting, bending, stooping, or squatting, lie in a position in which your body does not bend very much. For example, avoid curling up on your side with your arms and knees near your chest (fetal position).  If you have pain with activities such as standing for a long time or reaching with your arms, lie with your spine in a neutral position and bend your knees slightly. Try the following positions: ? Lying on your side with a pillow between your knees. ? Lying on your back with a pillow under your knees. Lifting   When lifting objects, keep your feet at least shoulder width apart and tighten your abdominal muscles.  Bend your knees and hips and keep your spine neutral. It is important to lift using the strength of your legs, not your back. Do not lock your knees straight out.  Always ask for help to lift heavy or awkward objects. This information is not   intended to replace advice given to you by your health care provider. Make sure you discuss any questions you have with your health care provider. Document Released: 02/18/2005 Document Revised: 06/12/2018 Document Reviewed: 03/12/2018 Elsevier Patient Education  2020 [employer]  

## 2019-02-01 ENCOUNTER — Telehealth: Payer: Self-pay | Admitting: Family

## 2019-02-01 NOTE — Telephone Encounter (Signed)
I did not see her to begin with, it was [employer]

## 2019-02-01 NOTE — Telephone Encounter (Signed)
Is she ok to return? Please advise

## 2019-02-02 ENCOUNTER — Ambulatory Visit: Admitting: Physical Therapy

## 2019-02-02 NOTE — Telephone Encounter (Signed)
Note written. If we need to fax please do so.

## 2019-02-02 NOTE — Telephone Encounter (Signed)
Faxed and called

## 2019-02-08 ENCOUNTER — Encounter: Payer: Self-pay | Admitting: Physical Therapy

## 2019-02-08 ENCOUNTER — Other Ambulatory Visit: Payer: Self-pay

## 2019-02-08 ENCOUNTER — Ambulatory Visit: Attending: Family | Admitting: Physical Therapy

## 2019-02-08 DIAGNOSIS — R262 Difficulty in walking, not elsewhere classified: Secondary | ICD-10-CM | POA: Insufficient documentation

## 2019-02-08 DIAGNOSIS — M25552 Pain in left hip: Secondary | ICD-10-CM | POA: Insufficient documentation

## 2019-02-08 DIAGNOSIS — M5442 Lumbago with sciatica, left side: Secondary | ICD-10-CM | POA: Insufficient documentation

## 2019-02-08 NOTE — Therapy (Signed)
[facility] [place], [place], [address] Phone: [phone]   Fax:  [phone]  Physical Therapy Evaluation  Patient Details  Name: Natalie Tanner MRN: [id number] Date of Birth: [birth year] Referring Provider (PT): Evelina Dun, FNP   Encounter Date: 02/08/2019  PT End of Session - 02/08/19 1310    Visit Number  1    Number of Visits  12    Date for PT Re-Evaluation  03/29/19    Authorization Type  progress note every 10th visit    PT Start Time  1115    PT Stop Time  1201    PT Time Calculation (min)  46 min    Activity Tolerance  Patient tolerated treatment well    Behavior During Therapy  [facility] for tasks assessed/performed       Past Medical History:  Diagnosis Date  . Anxiety   . Coronary atherosclerosis of native coronary artery    Managed medically following cardiac catheterization 5/12  . Depression   . Essential hypertension, benign   . GERD (gastroesophageal reflux disease)   . Iron deficiency anemia    Secondary to menometrorrhagia  . Mixed hyperlipidemia   . Myocardial infarction [facility])    NSTEMI 5/12  . Nephrolithiasis   . Type 2 diabetes mellitus ([place])     Past Surgical History:  Procedure Laterality Date  . BACK SURGERY    . KNEE SURGERY    . SINUS SURGERY WITH INSTATRAK      There were no vitals filed for this visit.   Subjective Assessment - 02/08/19 1302    Subjective  COVID-19 screening performed upon arrival.Patient arrives to physical therapy with low back pain and anterior hip pain that began due to an unknown cause about 3 weeks ago. Patient reports pain limits her ability to walk, negotiate steps, and perform ADLs and dressing activities. Patient reports getting assistance from family for lower body dressing. Patient reports pain occurs with work activities and bending. Patient's pain at worst is 10/10 and pain at best as 1-2/10. Patient reports occasional radiating pain to the left  knee. Patient's goals are to decrease pain, improve movement, and improve ability to perform home and work activities.    Pertinent History  HTN, DM, history of MI    Limitations  Walking;House hold activities;Standing    Diagnostic tests  X-ray of hip: arthritis per patient report    Patient Stated Goals  decrease pain, improve ability to perform home and work activities    Currently in Pain?  Yes    Pain Score  3     Pain Location  Back   and anterior hip   Pain Orientation  Left    Pain Descriptors / Indicators  Sore;Aching    Pain Type  Acute pain    Pain Radiating Towards  left knee    Pain Onset  1 to 4 weeks ago    Pain Frequency  Constant    Aggravating Factors   bending over, standing    Pain Relieving Factors  resting    Effect of Pain on Daily Activities  difficult to perform work activites and home activities.         [facility] PT Assessment - 02/08/19 0001      Assessment   Medical Diagnosis  Acute left sided low back pain with left sided sciatica    Referring Provider (PT)  Evelina Dun, FNP    Onset Date/Surgical Date  --   late November  2020   Next MD Visit  January 2020    Prior Therapy  no      Precautions   Precautions  None      Restrictions   Weight Bearing Restrictions  No      Balance Screen   Has the patient fallen in the past 6 months  Yes    How many times?  4    Has the patient had a decrease in activity level because of a fear of falling?   No    Is the patient reluctant to leave their home because of a fear of falling?   No      Home Film/video editor residence      Prior Function   Level of Independence  Independent with basic ADLs      ROM / Strength   AROM / PROM / Strength  AROM;Strength      AROM   Overall AROM Comments  left hip AROM: flexion: 100, abduction: 10, adduction: 23 degrees     AROM Assessment Site  Lumbar    Lumbar Flexion  12" finger tip to floor    Lumbar - Right Side Bend  22" finger tip to  floor    Lumbar - Left Side Bend  21" finger tip to floor      Strength   Overall Strength  Deficits    Strength Assessment Site  Knee;Hip    Right/Left Hip  Right;Left    Right Hip Flexion  4-/5    Right Hip Extension  3/5    Right Hip ABduction  3-/5    Left Hip Flexion  3-/5    Left Hip Extension  3-/5    Left Hip ABduction  3-/5    Right/Left Knee  Right;Left    Right Knee Flexion  4/5    Right Knee Extension  4/5    Left Knee Flexion  3+/5    Left Knee Extension  3+/5      Special Tests    Special Tests  Lumbar    Lumbar Tests  FABER test;Straight Leg Raise      FABER test   findings  Positive    Side  LEft    Comment  increase pain      Straight Leg Raise   Findings  Negative    Side   Left    Comment  no increase of symptoms      Transfers   Comments  slow transitions      Ambulation/Gait   Gait Pattern  Step-to pattern;Decreased stance time - left;Decreased step length - right;Decreased stride length;Decreased weight shift to left;Antalgic                Objective measurements completed on examination: See above findings.              PT Education - 02/08/19 1308    Education Details  draw ins, hamstring stretching, hip abduction, hip adduction    Person(s) Educated  Patient    Methods  Explanation;Demonstration    Comprehension  Verbalized understanding;Returned demonstration          PT Long Term Goals - 02/08/19 1312      PT LONG TERM GOAL #1   Title  Patient will be independent with HEP    Time  6    Period  Weeks    Status  New      PT LONG TERM GOAL #2  Title  Patient will demonstrate 4/5 or greater bilateral LE MMT to improve stability during functional tasks.    Time  6    Period  Weeks    Status  New      PT LONG TERM GOAL #3   Title  Patient will report ability to perform work tasks and home activities with low back pain and left hip pain less than or equal to 2/10.    Time  6    Period  Weeks    Status   New      PT LONG TERM GOAL #4   Title  Patient will report no neurological symptoms down left LE to indicate no nerve irritation.    Time  6    Period  Weeks    Status  New             Plan - 02/08/19 1325    Clinical Impression Statement  Patient is a 58 year old female who presents to physical therapy with left sided low back pain with left sided sciatica, decreased LE MMT, and difficulty walking. Patient (+) left FABER's Test and (-) SLR. Minimal DTR elicited bilaterally. Patient and PT discussed HEP and plan of care to address deficits to which patient reported understanding. Patient would benefit from skilled physical therapy to address deficits and address patient's goals.    Personal Factors and Comorbidities  Time since onset of injury/illness/exacerbation;Comorbidity 2    Comorbidities  HTN, DM, history of MI    Examination-Activity Limitations  Bed Mobility;Locomotion Level;Transfers;Squat;Stairs;Stand;Dressing;Hygiene/Grooming;Bend    Stability/Clinical Decision Making  Stable/Uncomplicated    Clinical Decision Making  Low    Rehab Potential  Good    PT Frequency  2x / week    PT Duration  6 weeks    PT Treatment/Interventions  ADLs/Self Care Home Management;Cryotherapy;Electrical Stimulation;Moist Heat;Ultrasound;Gait training;Stair training;Functional mobility training;Therapeutic activities;Therapeutic exercise;Balance training;Neuromuscular re-education;Manual techniques;Passive range of motion;Patient/family education    PT Next Visit Plan  nustep, core stabilization, LE strengthening, modalities to left anterior hip and low back pain as needed for pain relief.    PT Home Exercise Plan  see patient education section    Consulted and Agree with Plan of Care  Patient       Patient will benefit from skilled therapeutic intervention in order to improve the following deficits and impairments:  Decreased activity tolerance, Difficulty walking, Decreased strength, Decreased  range of motion, Pain, Postural dysfunction  Visit Diagnosis: Acute left-sided low back pain with left-sided sciatica - Plan: PT plan of care cert/re-cert  Pain in left hip - Plan: PT plan of care cert/re-cert  Difficulty in walking, not elsewhere classified - Plan: PT plan of care cert/re-cert     Problem List Patient Active Problem List   Diagnosis Date Noted  . Plantar fasciitis, right 09/16/2018  . Achilles tendon disorder, right 09/16/2018  . Concussion with loss of consciousness 11/17/2017  . Other complicated headache syndrome 11/17/2017  . Osteoarthritis of finger 03/14/2017  . Trigger finger of left thumb 11/27/2016  . Calcification of tendon 11/27/2016  . Arthralgia of both hands 08/26/2016  . Labyrinthitis of left ear 02/21/2016  . Inner ear disease, left 02/21/2016  . Bilateral hearing loss 06/26/2015  . 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 Coronary atherosclerosis of native coronary artery 08/13/2010  . Diabetes ([place]) 08/13/2010  . Mixed hyperlipidemia 08/13/2010  . Essential hypertension, benign 08/13/2010  . Iron deficiency anemia 08/13/2010    Gabriela Eves, PT, DPT 02/08/2019, 1:29 PM  [facility] [place], [place], [address] Phone: [phone]   Fax:  [phone]  Name: Shaiel Pelz MRN: [id number] Date of Birth: 07-20-1960

## 2019-02-15 ENCOUNTER — Encounter: Payer: Self-pay | Admitting: Physical Therapy

## 2019-02-15 ENCOUNTER — Other Ambulatory Visit: Payer: Self-pay

## 2019-02-15 ENCOUNTER — Ambulatory Visit: Admitting: Physical Therapy

## 2019-02-15 DIAGNOSIS — M25552 Pain in left hip: Secondary | ICD-10-CM

## 2019-02-15 DIAGNOSIS — M5442 Lumbago with sciatica, left side: Secondary | ICD-10-CM | POA: Diagnosis not present

## 2019-02-15 DIAGNOSIS — R262 Difficulty in walking, not elsewhere classified: Secondary | ICD-10-CM

## 2019-02-15 NOTE — Therapy (Signed)
[facility] [place], [place], [address] Phone: [phone]   Fax:  [phone]  Physical Therapy Treatment  Patient Details  Name: Natalie Tanner MRN: [id number] Date of Birth: [birth year] Referring Provider (PT): Evelina Dun, FNP   Encounter Date: 02/15/2019  PT End of Session - 02/15/19 1247    Visit Number  2    Number of Visits  12    Date for PT Re-Evaluation  03/29/19    Authorization Type  progress note every 10th visit    PT Start Time  1120    PT Stop Time  1212    PT Time Calculation (min)  52 min    Activity Tolerance  Patient tolerated treatment well    Behavior During Therapy  [facility] for tasks assessed/performed       Past Medical History:  Diagnosis Date  . Anxiety   . Coronary atherosclerosis of native coronary artery    Managed medically following cardiac catheterization 5/12  . Depression   . Essential hypertension, benign   . GERD (gastroesophageal reflux disease)   . Iron deficiency anemia    Secondary to menometrorrhagia  . Mixed hyperlipidemia   . Myocardial infarction [facility])    NSTEMI 5/12  . Nephrolithiasis   . Type 2 diabetes mellitus ([place])     Past Surgical History:  Procedure Laterality Date  . BACK SURGERY    . KNEE SURGERY    . SINUS SURGERY WITH INSTATRAK      There were no vitals filed for this visit.  Subjective Assessment - 02/15/19 1239    Subjective  COVID-19 screen performed prior to patient entering clinic.  Not too bad today.    Pertinent History  HTN, DM, history of MI    Limitations  Walking;House hold activities;Standing    Diagnostic tests  X-ray of hip: arthritis per patient report    Patient Stated Goals  decrease pain, improve ability to perform home and work activities    Currently in Pain?  Yes    Pain Score  3     Pain Location  Back    Pain Orientation  Left    Pain Descriptors / Indicators  Aching;Sore    Pain Type  Acute pain    Pain Onset  1 to 4  weeks ago                       [facility] Adult PT Treatment/Exercise - 02/15/19 0001      Modalities   Modalities  Electrical Stimulation;Ultrasound      Electrical Stimulation   Electrical Stimulation Location  Left lateral hip/left low back.    Electrical Stimulation Action  IFC    Electrical Stimulation Parameters  80-150 Hz x 20 minutes.    Electrical Stimulation Goals  Tone;Pain      Ultrasound   Ultrasound Location  Right lateral hip, piriformis and left low back.    Ultrasound Parameters  Combo e'stim/U/S at 1.50 W/CM2 x 12 minutes.      Manual Therapy   Manual Therapy  Soft tissue mobilization    Manual therapy comments  Right sdly position with pillow between knees for comfort:  STW/M to left lateral hip, piriformis and left low back x 12 minutes to reduce tone.                  PT Long Term Goals - 02/08/19 1312      PT LONG TERM GOAL #1  Title  Patient will be independent with HEP    Time  6    Period  Weeks    Status  New      PT LONG TERM GOAL #2   Title  Patient will demonstrate 4/5 or greater bilateral LE MMT to improve stability during functional tasks.    Time  6    Period  Weeks    Status  New      PT LONG TERM GOAL #3   Title  Patient will report ability to perform work tasks and home activities with low back pain and left hip pain less than or equal to 2/10.    Time  6    Period  Weeks    Status  New      PT LONG TERM GOAL #4   Title  Patient will report no neurological symptoms down left LE to indicate no nerve irritation.    Time  6    Period  Weeks    Status  New            Plan - 02/15/19 1243    Clinical Impression Statement  Patient did well today with treatment.  She had some tenderness to palpation and increased tone over her left lateral hip, priformis and left low back.  She was also found to be restricted into left hip IR/ER.  She has continued referred pain into her left groin and left buttock.  Patient  provided with [facility] and adductor squeeze exercise.    Personal Factors and Comorbidities  Time since onset of injury/illness/exacerbation;Comorbidity 2    Comorbidities  HTN, DM, history of MI       Patient will benefit from skilled therapeutic intervention in order to improve the following deficits and impairments:     Visit Diagnosis: Acute left-sided low back pain with left-sided sciatica  Pain in left hip  Difficulty in walking, not elsewhere classified     Problem List Patient Active Problem List   Diagnosis Date Noted  . Plantar fasciitis, right 09/16/2018  . Achilles tendon disorder, right 09/16/2018  . Concussion with loss of consciousness 11/17/2017  . Other complicated headache syndrome 11/17/2017  . Osteoarthritis of finger 03/14/2017  . Trigger finger of left thumb 11/27/2016  . Calcification of tendon 11/27/2016  . Arthralgia of both hands 08/26/2016  . Labyrinthitis of left ear 02/21/2016  . Inner ear disease, left 02/21/2016  . Bilateral hearing loss 06/26/2015  . 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 Coronary atherosclerosis of native coronary artery 08/13/2010  . Diabetes ([place]) 08/13/2010  . Mixed hyperlipidemia 08/13/2010  . Essential hypertension, benign 08/13/2010  . Iron deficiency anemia 08/13/2010    Yalda Herd, [place] MPT 02/15/2019, 12:50 PM  [facility] [address] [place], [place], [address] Phone: [phone]   Fax:  [phone]  Name: Wannie Balzarini MRN: [id number] Date of Birth: 1960/04/11

## 2019-02-17 ENCOUNTER — Other Ambulatory Visit: Payer: Self-pay | Admitting: Family

## 2019-02-18 ENCOUNTER — Encounter: Admitting: Physical Therapy

## 2019-02-19 ENCOUNTER — Other Ambulatory Visit: Payer: Self-pay

## 2019-02-22 ENCOUNTER — Encounter: Payer: Self-pay | Admitting: Family

## 2019-02-22 ENCOUNTER — Other Ambulatory Visit: Payer: Self-pay

## 2019-02-22 ENCOUNTER — Ambulatory Visit (INDEPENDENT_AMBULATORY_CARE_PROVIDER_SITE_OTHER): Admitting: Family

## 2019-02-22 VITALS — BP 110/66 | HR 59 | Temp 97.3°F | Ht 63.0 in | Wt 202.4 lb

## 2019-02-22 DIAGNOSIS — M5432 Sciatica, left side: Secondary | ICD-10-CM

## 2019-02-22 DIAGNOSIS — M25552 Pain in left hip: Secondary | ICD-10-CM

## 2019-02-22 MED ORDER — GABAPENTIN 300 MG PO CAPS: 300.0000 mg | ORAL_CAPSULE | Freq: Three times a day (TID) | ORAL | 3 refills | Status: DC

## 2019-02-22 MED ORDER — PREDNISONE 10 MG (21) PO TBPK: ORAL_TABLET | ORAL | 0 refills | Status: DC

## 2019-02-22 NOTE — Progress Notes (Signed)
Subjective:    Patient ID: Natalie Tanner, female    DOB: 05-27-1960, 58 y.o.   MRN: [id number]  Chief Complaint  Patient presents with  . right leg pain   Pt presents to the office today with recurrent left hip and sciatic pain. She went to the ED on 01/19/19 and was told her x-ray was negative for a fracture, but had arthritis. She was given steroid injection and motrin. She returned to the office on 01/21/19 with continued pain and was given Toradol and gabapentin. We also did a referral to PT. She states her pain is constant burning, aching pain of   7 out 10.  Leg Pain  The incident occurred more than 1 week ago. There was no injury mechanism. The pain is present in the left hip. The pain is at a severity of 7/10. The pain is moderate. The pain has been intermittent since onset. She reports no foreign bodies present. The symptoms are aggravated by movement. She has tried acetaminophen, elevation, ice, non-weight bearing and NSAIDs for the symptoms. The treatment provided mild relief.      Review of Systems  All other systems reviewed and are negative.      Objective:   Physical Exam Vitals reviewed.  Constitutional:      General: She is not in acute distress.    Appearance: She is well-developed.  HENT:     Head: Normocephalic and atraumatic.  Eyes:     Pupils: Pupils are equal, round, and reactive to light.  Neck:     Thyroid: No thyromegaly.  Cardiovascular:     Rate and Rhythm: Normal rate and regular rhythm.     Heart sounds: Normal heart sounds. No murmur.  Pulmonary:     Effort: Pulmonary effort is normal. No respiratory distress.     Breath sounds: Normal breath sounds. No wheezing.  Abdominal:     General: Bowel sounds are normal. There is no distension.     Palpations: Abdomen is soft.     Tenderness: There is no abdominal tenderness.  Musculoskeletal:        General: No tenderness. Normal range of motion.     Cervical back: Normal range of motion and neck  supple.     Comments: Pain in left groin with flexion and internal rotation of left hip   Skin:    General: Skin is warm and dry.  Neurological:     Mental Status: She is alert and oriented to person, place, and time.     Cranial Nerves: No cranial nerve deficit.     Deep Tendon Reflexes: Reflexes are normal and symmetric.  Psychiatric:        Behavior: Behavior normal.        Thought Content: Thought content normal.        Judgment: Judgment normal.     BP 110/66   Pulse (!) 59   Temp (!) 97.3 F (36.3 C) (Temporal)   Ht 5\' 3"  (1.6 m)   Wt 202 lb 6.4 oz (91.8 kg)   LMP 07/05/2010   SpO2 99%   BMI 35.85 kg/m      Assessment & Plan:  Natalie Tanner comes in today with chief complaint of Leg Pain   Diagnosis and orders addressed:  1. Left sciatic nerve pain Will increase gabapentin to 300 mg TID from 100 mg Strict low carb while taking prednisone Continue PT Follow up with Ortho - gabapentin (NEURONTIN) 300 MG capsule; Take 1 capsule (300 mg  total) by mouth 3 (three) times daily.  Dispense: 90 capsule; Refill: 3 - predniSONE (STERAPRED UNI-PAK 21 TAB) 10 MG (21) TBPK tablet; Use as directed  Dispense: 21 tablet; Refill: 0 - Ambulatory referral to Orthopedic Surgery  2. Left hip pain - predniSONE (STERAPRED UNI-PAK 21 TAB) 10 MG (21) TBPK tablet; Use as directed  Dispense: 21 tablet; Refill: 0 - Ambulatory referral to [place], FNP

## 2019-02-22 NOTE — Patient Instructions (Signed)
Sciatica  Sciatica is pain, numbness, weakness, or tingling along the path of the sciatic nerve. The sciatic nerve starts in the lower back and runs down the back of each leg. The nerve controls the muscles in the lower leg and in the back of the knee. It also provides feeling (sensation) to the back of the thigh, the lower leg, and the sole of the foot. Sciatica is a symptom of another medical condition that pinches or puts pressure on the sciatic nerve. Sciatica most often only affects one side of the body. Sciatica usually goes away on its own or with treatment. In some cases, sciatica may come back (recur). What are the causes? This condition is caused by pressure on the sciatic nerve or pinching of the nerve. This may be the result of:  A disk in between the bones of the spine bulging out too far (herniated disk).  Age-related changes in the spinal disks.  A pain disorder that affects a muscle in the buttock.  Extra bone growth near the sciatic nerve.  A break (fracture) of the pelvis.  Pregnancy.  Tumor. This is rare. What increases the risk? The following factors may make you more likely to develop this condition:  Playing sports that place pressure or stress on the spine.  Having poor strength and flexibility.  A history of back injury or surgery.  Sitting for long periods of time.  Doing activities that involve repetitive bending or lifting.  Obesity. What are the signs or symptoms? Symptoms can vary from mild to very severe, and they may include:  Any of these problems in the lower back, leg, hip, or buttock: ? Mild tingling, numbness, or dull aches. ? Burning sensations. ? Sharp pains.  Numbness in the back of the calf or the sole of the foot.  Leg weakness.  Severe back pain that makes movement difficult. Symptoms may get worse when you cough, sneeze, or laugh, or when you sit or stand for long periods of time. How is this diagnosed? This condition may be  diagnosed based on:  Your symptoms and medical history.  A physical exam.  Blood tests.  Imaging tests, such as: ? X-rays. ? MRI. ? CT scan. How is this treated? In many cases, this condition improves on its own without treatment. However, treatment may include:  Reducing or modifying physical activity.  Exercising and stretching.  Icing and applying heat to the affected area.  Medicines that help to: ? Relieve pain and swelling. ? Relax your muscles.  Injections of medicines that help to relieve pain, irritation, and inflammation around the sciatic nerve (steroids).  Surgery. Follow these instructions at home: Medicines  Take over-the-counter and prescription medicines only as told by your health care provider.  Ask your health care provider if the medicine prescribed to you: ? Requires you to avoid driving or using heavy machinery. ? Can cause constipation. You may need to take these actions to prevent or treat constipation:  Drink enough fluid to keep your urine pale yellow.  Take over-the-counter or prescription medicines.  Eat foods that are high in fiber, such as beans, whole grains, and fresh fruits and vegetables.  Limit foods that are high in fat and processed sugars, such as fried or sweet foods. Managing pain      If directed, put ice on the affected area. ? Put ice in a plastic bag. ? Place a towel between your skin and the bag. ? Leave the ice on for 20 minutes,   2-3 times a day.  If directed, apply heat to the affected area. Use the heat source that your health care provider recommends, such as a moist heat pack or a heating pad. ? Place a towel between your skin and the heat source. ? Leave the heat on for 20-30 minutes. ? Remove the heat if your skin turns bright red. This is especially important if you are unable to feel pain, heat, or cold. You may have a greater risk of getting burned. Activity   Return to your normal activities as told  by your health care provider. Ask your health care provider what activities are safe for you.  Avoid activities that make your symptoms worse.  Take brief periods of rest throughout the day. ? When you rest for longer periods, mix in some mild activity or stretching between periods of rest. This will help to prevent stiffness and pain. ? Avoid sitting for long periods of time without moving. Get up and move around at least one time each hour.  Exercise and stretch regularly, as told by your health care provider.  Do not lift anything that is heavier than 10 lb (4.5 kg) while you have symptoms of sciatica. When you do not have symptoms, you should still avoid heavy lifting, especially repetitive heavy lifting.  When you lift objects, always use proper lifting technique, which includes: ? Bending your knees. ? Keeping the load close to your body. ? Avoiding twisting. General instructions  Maintain a healthy weight. Excess weight puts extra stress on your back.  Wear supportive, comfortable shoes. Avoid wearing high heels.  Avoid sleeping on a mattress that is too soft or too hard. A mattress that is firm enough to support your back when you sleep may help to reduce your pain.  Keep all follow-up visits as told by your health care provider. This is important. Contact a health care provider if:  You have pain that: ? Wakes you up when you are sleeping. ? Gets worse when you lie down. ? Is worse than you have experienced in the past. ? Lasts longer than 4 weeks.  You have an unexplained weight loss. Get help right away if:  You are not able to control when you urinate or have bowel movements (incontinence).  You have: ? Weakness in your lower back, pelvis, buttocks, or legs that gets worse. ? Redness or swelling of your back. ? A burning sensation when you urinate. Summary  Sciatica is pain, numbness, weakness, or tingling along the path of the sciatic nerve.  This condition  is caused by pressure on the sciatic nerve or pinching of the nerve.  Sciatica can cause pain, numbness, or tingling in the lower back, legs, hips, and buttocks.  Treatment often includes rest, exercise, medicines, and applying ice or heat. This information is not intended to replace advice given to you by your health care provider. Make sure you discuss any questions you have with your health care provider. Document Released: 02/12/2001 Document Revised: 03/09/2018 Document Reviewed: 03/09/2018 Elsevier Patient Education  [place] your health care provider which exercises are safe for you. Do exercises exactly as told by your health care provider and adjust them as directed. It is normal to feel mild stretching, pulling, tightness, or discomfort as you do these exercises. Stop right away if you feel sudden pain or your pain gets worse. Do not begin these exercises until told by your health care provider. Stretching and range-of-motion exercises These  exercises warm up your muscles and joints and improve the movement and flexibility of your hips and back. These exercises also help to relieve pain, numbness, and tingling. Sciatic nerve glide 1. Sit in a chair with your head facing down toward your chest. Place your hands behind your back. Let your shoulders slump forward. 2. Slowly straighten one of your legs while you tilt your head back as if you are looking toward the ceiling. Only straighten your leg as far as you can without making your symptoms worse. 3. Hold this position for __________ seconds. 4. Slowly return to the starting position. 5. Repeat with your other leg. Repeat __________ times. Complete this exercise __________ times a day. Knee to chest with hip adduction and internal rotation  1. Lie on your back on a firm surface with both legs straight. 2. Bend one of your knees and move it up toward your chest until you feel a gentle stretch in your lower  back and buttock. Then, move your knee toward the shoulder that is on the opposite side from your leg. This is hip adduction and internal rotation. ? Hold your leg in this position by holding on to the front of your knee. 3. Hold this position for __________ seconds. 4. Slowly return to the starting position. 5. Repeat with your other leg. Repeat __________ times. Complete this exercise __________ times a day. Prone extension on elbows  1. Lie on your abdomen on a firm surface. A bed may be too soft for this exercise. 2. Prop yourself up on your elbows. 3. Use your arms to help lift your chest up until you feel a gentle stretch in your abdomen and your lower back. ? This will place some of your body weight on your elbows. If this is uncomfortable, try stacking pillows under your chest. ? Your hips should stay down, against the surface that you are lying on. Keep your hip and back muscles relaxed. 4. Hold this position for __________ seconds. 5. Slowly relax your upper body and return to the starting position. Repeat __________ times. Complete this exercise __________ times a day. Strengthening exercises These exercises build strength and endurance in your back. Endurance is the ability to use your muscles for a long time, even after they get tired. Pelvic tilt This exercise strengthens the muscles that lie deep in the abdomen. 1. Lie on your back on a firm surface. Bend your knees and keep your feet flat on the floor. 2. Tense your abdominal muscles. Tip your pelvis up toward the ceiling and flatten your lower back into the floor. ? To help with this exercise, you may place a small towel under your lower back and try to push your back into the towel. 3. Hold this position for __________ seconds. 4. Let your muscles relax completely before you repeat this exercise. Repeat __________ times. Complete this exercise __________ times a day. Alternating arm and leg raises  1. Get on your hands  and knees on a firm surface. If you are on a hard floor, you may want to use padding, such as an exercise mat, to cushion your knees. 2. Line up your arms and legs. Your hands should be directly below your shoulders, and your knees should be directly below your hips. 3. Lift your left leg behind you. At the same time, raise your right arm and straighten it in front of you. ? Do not lift your leg higher than your hip. ? Do not lift your arm higher  than your shoulder. ? Keep your abdominal and back muscles tight. ? Keep your hips facing the ground. ? Do not arch your back. ? Keep your balance carefully, and do not hold your breath. 4. Hold this position for __________ seconds. 5. Slowly return to the starting position. 6. Repeat with your right leg and your left arm. Repeat __________ times. Complete this exercise __________ times a day. Posture and body mechanics Good posture and healthy body mechanics can help to relieve stress in your body's tissues and joints. Body mechanics refers to the movements and positions of your body while you do your daily activities. Posture is part of body mechanics. Good posture means:  Your spine is in its natural S-curve position (neutral).  Your shoulders are pulled back slightly.  Your head is not tipped forward. Follow these guidelines to improve your posture and body mechanics in your everyday activities. Standing   When standing, keep your spine neutral and your feet about hip width apart. Keep a slight bend in your knees. Your ears, shoulders, and hips should line up.  When you do a task in which you stand in one place for a long time, place one foot up on a stable object that is 2-4 inches (5-10 cm) high, such as a footstool. This helps keep your spine neutral. Sitting   When sitting, keep your spine neutral and keep your feet flat on the floor. Use a footrest, if necessary, and keep your thighs parallel to the floor. Avoid rounding your  shoulders, and avoid tilting your head forward.  When working at a desk or a computer, keep your desk at a height where your hands are slightly lower than your elbows. Slide your chair under your desk so you are close enough to maintain good posture.  When working at a computer, place your monitor at a height where you are looking straight ahead and you do not have to tilt your head forward or downward to look at the screen. Resting  When lying down and resting, avoid positions that are most painful for you.  If you have pain with activities such as sitting, bending, stooping, or squatting, lie in a position in which your body does not bend very much. For example, avoid curling up on your side with your arms and knees near your chest (fetal position).  If you have pain with activities such as standing for a long time or reaching with your arms, lie with your spine in a neutral position and bend your knees slightly. Try the following positions: ? Lying on your side with a pillow between your knees. ? Lying on your back with a pillow under your knees. Lifting   When lifting objects, keep your feet at least shoulder width apart and tighten your abdominal muscles.  Bend your knees and hips and keep your spine neutral. It is important to lift using the strength of your legs, not your back. Do not lock your knees straight out.  Always ask for help to lift heavy or awkward objects. This information is not intended to replace advice given to you by your health care provider. Make sure you discuss any questions you have with your health care provider. Document Released: 02/18/2005 Document Revised: 06/12/2018 Document Reviewed: 03/12/2018 Elsevier Patient Education  2020 [employer].

## 2019-03-04 ENCOUNTER — Ambulatory Visit: Admitting: Orthopaedic Surgery

## 2019-03-11 ENCOUNTER — Ambulatory Visit (INDEPENDENT_AMBULATORY_CARE_PROVIDER_SITE_OTHER): Admitting: Orthopaedic Surgery

## 2019-03-11 ENCOUNTER — Encounter: Payer: Self-pay | Admitting: Orthopaedic Surgery

## 2019-03-11 ENCOUNTER — Other Ambulatory Visit: Payer: Self-pay

## 2019-03-11 ENCOUNTER — Ambulatory Visit: Payer: Self-pay

## 2019-03-11 VITALS — BP 86/56 | HR 71 | Ht 64.0 in | Wt 204.0 lb

## 2019-03-11 DIAGNOSIS — M25552 Pain in left hip: Secondary | ICD-10-CM

## 2019-03-11 DIAGNOSIS — M1612 Unilateral primary osteoarthritis, left hip: Secondary | ICD-10-CM

## 2019-03-11 NOTE — Progress Notes (Signed)
Office Visit Note   Patient: Natalie Tanner           Date of Birth: 06-Nov-1960           MRN: [id number] Visit Date: 03/11/2019              Requested by: Sharion Balloon, [address],  [place] [address] PCP: Terald Sleeper, PA-C   Assessment & Plan: Visit Diagnoses:  1. Pain in left hip   2. Unilateral primary osteoarthritis, left hip     Plan: Patient can return in 4 weeks we reviewed x-rays and looked at the weightbearing films which shows joint space narrowing left hip consistent with osteoarthritis we offered her single one-time intra-articular joint aspiration and injection.  She can think about this and will return in 4 weeks.  Currently she not having any locking I do not think she needs an MRI there is no changes that would suggest avascular necrosis just joint space narrowing of the left hip.  We discussed some of her altered hip limping may be aggravating some of her back symptoms.  Recheck 4 weeks.  Follow-Up Instructions: Return in about 4 weeks (around 04/08/2019).   Orders:  Orders Placed This Encounter  Procedures  . XR Pelvis 1-2 Views   No orders of the defined types were placed in this encounter.     Procedures: No procedures performed   Clinical Data: No additional findings.   Subjective: Chief Complaint  Patient presents with  . Left Hip - Pain    HPI 59 year old female who has been followed by Particia Nearing, PA here with left groin pain which has been present and severity of 7 out of 10 for the last 2 months.  She has been on Naprosyn gabapentin and also ibuprofen.  She states taking anti-inflammatory eases it some the gabapentin helps also.  She has history of sciatica.  She does a lot of bending and walking at work.  Previous lumbar surgery L5-S1 by Dr. Carloyn Manner she states she had some mass removed which might have been a facet joint cyst.  She works at [employer] and does a lot of walking and bending.  Previous x-rays which were not  weightbearing films did show joint space narrowing consistent with some mild hip osteoarthritis.  Patient denies history of gout.  Review of Systems previous back surgery 2014 as above kidney surgery 2013.  Patient does have diabetes history of depression anxiety hypertension migraines previous CVA also acid reflux.   Objective: Vital Signs: BP (!) 86/56   Pulse 71   Ht 5\' 4"  (1.626 m)   Wt 204 lb (92.5 kg)   LMP 07/05/2010   BMI 35.02 kg/m   Physical Exam Constitutional:      Appearance: She is well-developed.  HENT:     Head: Normocephalic.     Right Ear: External ear normal.     Left Ear: External ear normal.  Eyes:     Pupils: Pupils are equal, round, and reactive to light.  Neck:     Thyroid: No thyromegaly.     Trachea: No tracheal deviation.  Cardiovascular:     Rate and Rhythm: Normal rate.  Pulmonary:     Effort: Pulmonary effort is normal.  Abdominal:     Palpations: Abdomen is soft.  Skin:    General: Skin is warm and dry.  Neurological:     Mental Status: She is alert and oriented to person, place, and time.  Psychiatric:  Behavior: Behavior normal.     Ortho Exam patient is amatory with mild left Trendelenburg gait.  Limited internal rotation left hip only 10 degrees which reproduces her pain right hip is 20 degrees without pain.  She is unable to figure 4 on the left hip she can on the right.  Knee reaches full extension distal pulses intact.  Mild sciatic notch tenderness minimal trochanteric bursal tenderness.  Specialty Comments:  No specialty comments available.  Imaging: XR Pelvis 1-2 Views  Result Date: 03/11/2019 Standing AP pelvis show both hips demonstrates mild spurring and joint space narrowing on the left hip.  Right hip is normal.  Pelvis and hips negative for acute changes. Impression: Left hip joint narrowing consistent with mild osteoarthritis.    PMFS History: Patient Active Problem List   Diagnosis Date Noted  . Unilateral  primary osteoarthritis, left hip 03/11/2019  . Plantar fasciitis, right 09/16/2018  . Achilles tendon disorder, right 09/16/2018  . Concussion with loss of consciousness 11/17/2017  . Other complicated headache syndrome 11/17/2017  . Osteoarthritis of finger 03/14/2017  . Trigger finger of left thumb 11/27/2016  . Calcification of tendon 11/27/2016  . Arthralgia of both hands 08/26/2016  . Labyrinthitis of left ear 02/21/2016  . Inner ear disease, left 02/21/2016  . Bilateral hearing loss 06/26/2015  . 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 Coronary atherosclerosis of native coronary artery 08/13/2010  . Diabetes ([place]) 08/13/2010  . Mixed hyperlipidemia 08/13/2010  . Essential hypertension, benign 08/13/2010  . Iron deficiency anemia 08/13/2010   Past Medical History:  Diagnosis Date  . Anxiety   . Coronary atherosclerosis of native coronary artery    Managed medically following cardiac catheterization 5/12  . Depression   . Essential hypertension, benign   . GERD (gastroesophageal reflux disease)   . Iron deficiency anemia    Secondary to menometrorrhagia  . Mixed hyperlipidemia   . Myocardial infarction [facility])    NSTEMI 5/12  . Nephrolithiasis   . Type 2 diabetes mellitus (HCC)     Family History  Problem Relation Age of Onset  . Other Father        Clostridium difficile  . Stroke Father     Past Surgical History:  Procedure Laterality Date  . BACK SURGERY    . KNEE SURGERY    . SINUS SURGERY WITH INSTATRAK     Social History   Occupational History  . Occupation: [employer]  Tobacco Use  . Smoking status: Former Smoker    Packs/day: 1.00    Years: 20.00    Pack years: 20.00    Types: Cigarettes    Quit date: 03/04/2001    Years since quitting: 18.0  . Smokeless tobacco: Never Used  Substance and Sexual Activity  . Alcohol use: No  . Drug use: No  . Sexual activity: Yes

## 2019-03-19 ENCOUNTER — Ambulatory Visit (INDEPENDENT_AMBULATORY_CARE_PROVIDER_SITE_OTHER): Admitting: Physician Assistant

## 2019-03-19 ENCOUNTER — Encounter: Payer: Self-pay | Admitting: Physician Assistant

## 2019-03-19 ENCOUNTER — Other Ambulatory Visit: Payer: Self-pay

## 2019-03-19 VITALS — BP 88/65 | HR 68 | Temp 97.9°F | Ht 64.0 in | Wt 200.6 lb

## 2019-03-19 DIAGNOSIS — F419 Anxiety disorder, unspecified: Secondary | ICD-10-CM

## 2019-03-19 DIAGNOSIS — E1165 Type 2 diabetes mellitus with hyperglycemia: Secondary | ICD-10-CM

## 2019-03-19 DIAGNOSIS — M25542 Pain in joints of left hand: Secondary | ICD-10-CM

## 2019-03-19 DIAGNOSIS — M25541 Pain in joints of right hand: Secondary | ICD-10-CM

## 2019-03-19 DIAGNOSIS — I10 Essential (primary) hypertension: Secondary | ICD-10-CM | POA: Diagnosis not present

## 2019-03-19 DIAGNOSIS — E782 Mixed hyperlipidemia: Secondary | ICD-10-CM

## 2019-03-19 LAB — BAYER DCA HB A1C WAIVED: HB A1C (BAYER DCA - WAIVED): 6.2 % (ref <7.0)

## 2019-03-19 MED ORDER — PRAVASTATIN SODIUM 40 MG PO TABS: 40.0000 mg | ORAL_TABLET | Freq: Every day | ORAL | 3 refills | Status: DC

## 2019-03-19 MED ORDER — METOPROLOL TARTRATE 50 MG PO TABS: 50.0000 mg | ORAL_TABLET | Freq: Two times a day (BID) | ORAL | 3 refills | Status: DC

## 2019-03-19 MED ORDER — ISOSORBIDE MONONITRATE ER 30 MG PO TB24: 30.0000 mg | ORAL_TABLET | Freq: Two times a day (BID) | ORAL | 3 refills | Status: DC

## 2019-03-19 MED ORDER — JARDIANCE 25 MG PO TABS: 25.0000 mg | ORAL_TABLET | Freq: Every day | ORAL | 3 refills | Status: DC

## 2019-03-19 MED ORDER — GLIPIZIDE 10 MG PO TABS: 10.0000 mg | ORAL_TABLET | Freq: Every day | ORAL | 3 refills | Status: DC

## 2019-03-19 MED ORDER — ALPRAZOLAM 1 MG PO TABS: 1.0000 mg | ORAL_TABLET | Freq: Two times a day (BID) | ORAL | 1 refills | Status: DC | PRN

## 2019-03-19 MED ORDER — PAROXETINE HCL 20 MG PO TABS: 20.0000 mg | ORAL_TABLET | ORAL | 3 refills | Status: DC

## 2019-03-19 MED ORDER — SITAGLIPTIN PHOSPHATE 100 MG PO TABS: 100.0000 mg | ORAL_TABLET | Freq: Every day | ORAL | 1 refills | Status: DC

## 2019-03-19 MED ORDER — LISINOPRIL 10 MG PO TABS: 10.0000 mg | ORAL_TABLET | Freq: Every day | ORAL | 0 refills | Status: DC

## 2019-03-19 MED ORDER — OMEPRAZOLE MAGNESIUM 20 MG PO TBEC: 20.0000 mg | DELAYED_RELEASE_TABLET | Freq: Every day | ORAL | 3 refills | Status: DC

## 2019-03-19 NOTE — Addendum Note (Signed)
Addended by: Thana Ates on: 03/19/2019 10:57 AM   Modules accepted: Orders

## 2019-03-19 NOTE — Progress Notes (Signed)
Acute Office Visit  Subjective:    Patient ID: Natalie Tanner, female    DOB: December 18, 1960, 59 y.o.   MRN: [id number]  Chief Complaint  Patient presents with  . Diabetes  . Hyperlipidemia  . Medical Management of Chronic Issues    6 month     Diabetes She presents for her follow-up diabetic visit. She has type 2 diabetes mellitus. Her disease course has been stable. There are no hypoglycemic associated symptoms. Pertinent negatives for diabetes include no chest pain. Risk factors for coronary artery disease include diabetes mellitus and dyslipidemia. She is compliant with treatment all of the time. Her weight is stable. She is following a diabetic diet. An ACE inhibitor/angiotensin II receptor blocker is being taken. She sees a podiatrist.Eye exam is current.  Hyperlipidemia This is a chronic problem. The problem is controlled. Recent lipid tests were reviewed and are normal. Exacerbating diseases include diabetes and obesity. Associated symptoms include myalgias. Pertinent negatives include no chest pain. Current antihyperlipidemic treatment includes statins.  Hypertension Pertinent negatives include no chest pain. Past treatments include ACE inhibitors, beta blockers and diuretics.  Hip Pain  The incident occurred more than 1 week ago. There was no injury mechanism. The pain is present in the left hip. The pain is at a severity of 5/10. The pain is severe. The pain has been constant since onset. Associated symptoms include an inability to bear weight. She has tried ice, non-weight bearing, NSAIDs, heat and acetaminophen for the symptoms. The treatment provided mild relief.     Past Medical History:  Diagnosis Date  . Anxiety   . Coronary atherosclerosis of native coronary artery    Managed medically following cardiac catheterization 5/12  . Depression   . Essential hypertension, benign   . GERD (gastroesophageal reflux disease)   . Iron deficiency anemia    Secondary to  menometrorrhagia  . Mixed hyperlipidemia   . Myocardial infarction [facility])    NSTEMI 5/12  . Nephrolithiasis   . Type 2 diabetes mellitus ([place])     Past Surgical History:  Procedure Laterality Date  . BACK SURGERY    . KNEE SURGERY    . SINUS SURGERY WITH INSTATRAK      Family History  Problem Relation Age of Onset  . Other Father        Clostridium difficile  . Stroke Father     Social History   Socioeconomic History  . Marital status: Married    Spouse name: Not on file  . Number of children: 2  . Years of education: 39  . Highest education level: Not on file  Occupational History  . Occupation: [employer]  Tobacco Use  . Smoking status: Former Smoker    Packs/day: 1.00    Years: 20.00    Pack years: 20.00    Types: Cigarettes    Quit date: 03/04/2001    Years since quitting: 18.0  . Smokeless tobacco: Never Used  Substance and Sexual Activity  . Alcohol use: No  . Drug use: No  . Sexual activity: Yes  Other Topics Concern  . Not on file  Social History Narrative   Drinks about 2L of soda a day    Social Determinants of Health   Financial Resource Strain:   . Difficulty of Paying Living Expenses: Not on file  Food Insecurity:   . Worried About Charity fundraiser in the Last Year: Not on file  . Ran Out of Food in the Last Year: Not  on file  Transportation Needs:   . Lack of Transportation (Medical): Not on file  . Lack of Transportation (Non-Medical): Not on file  Physical Activity:   . Days of Exercise per Week: Not on file  . Minutes of Exercise per Session: Not on file  Stress:   . Feeling of Stress : Not on file  Social Connections:   . Frequency of Communication with Friends and Family: Not on file  . Frequency of Social Gatherings with Friends and Family: Not on file  . Attends Religious Services: Not on file  . Active Member of Clubs or Organizations: Not on file  . Attends Archivist Meetings: Not on file  . Marital Status: Not on  file  Intimate Partner Violence:   . Fear of Current or Ex-Partner: Not on file  . Emotionally Abused: Not on file  . Physically Abused: Not on file  . Sexually Abused: Not on file    Outpatient Medications Prior to Visit  Medication Sig Dispense Refill  . ALPRAZolam (XANAX) 1 MG tablet Take 1 tablet (1 mg total) by mouth 2 (two) times daily as needed. 180 tablet 1  . aspirin 81 MG tablet Take 81 mg by mouth daily.      . blood glucose meter kit and supplies KIT Dispense based on patient and insurance preference. Use up to four times daily as directed. For diabetes E11.9, with hyperglycemia. 90 each 11  . empagliflozin (JARDIANCE) 25 MG TABS tablet Take 25 mg by mouth daily. 90 tablet 3  . gabapentin (NEURONTIN) 300 MG capsule Take 1 capsule (300 mg total) by mouth 3 (three) times daily. 90 capsule 3  . glipiZIDE (GLUCOTROL) 10 MG tablet Take 1 tablet (10 mg total) by mouth daily. 90 tablet 3  . ibuprofen (ADVIL) 800 MG tablet Take 800 mg by mouth every 8 (eight) hours as needed.    . isosorbide mononitrate (IMDUR) 30 MG 24 hr tablet Take 1 tablet (30 mg total) by mouth 2 (two) times daily. 180 tablet 3  . lisinopril (PRINIVIL,ZESTRIL) 40 MG tablet Take 1 tablet (40 mg total) by mouth daily. 90 tablet 3  . meclizine (ANTIVERT) 25 MG tablet Take 1 tablet (25 mg total) by mouth 3 (three) times daily as needed for dizziness. 90 tablet 11  . metoprolol tartrate (LOPRESSOR) 50 MG tablet Take 1 tablet (50 mg total) by mouth 2 (two) times daily. 180 tablet 3  . nitroGLYCERIN (NITROSTAT) 0.4 MG SL tablet Place 1 tablet (0.4 mg total) under the tongue every 5 (five) minutes as needed for chest pain. 25 tablet 2  . omeprazole (PRILOSEC OTC) 20 MG tablet Take 1 tablet (20 mg total) by mouth daily. 90 tablet 3  . PARoxetine (PAXIL) 20 MG tablet Take 1 tablet (20 mg total) by mouth every morning. 90 tablet 3  . pioglitazone (ACTOS) 30 MG tablet Take 1 tablet (30 mg total) by mouth daily. 90 tablet 1  .  pravastatin (PRAVACHOL) 40 MG tablet Take 1 tablet (40 mg total) by mouth daily. 90 tablet 3  . sitaGLIPtin (JANUVIA) 100 MG tablet Take 1 tablet (100 mg total) by mouth daily. 90 tablet 1  . predniSONE (STERAPRED UNI-PAK 21 TAB) 10 MG (21) TBPK tablet Use as directed (Patient not taking: Reported on 03/19/2019) 21 tablet 0   No facility-administered medications prior to visit.    No Known Allergies  Review of Systems  Constitutional: Negative.   HENT: Negative.   Eyes: Negative.  Respiratory: Negative.   Cardiovascular: Negative for chest pain.  Gastrointestinal: Negative.   Genitourinary: Negative.   Musculoskeletal: Positive for arthralgias, back pain, gait problem and myalgias.   Diabetic Foot Exam - Simple   Simple Foot Form Diabetic Foot exam was performed with the following findings: Yes 03/19/2019 10:42 AM  Visual Inspection No deformities, no ulcerations, no other skin breakdown bilaterally: Yes Sensation Testing Intact to touch and monofilament testing bilaterally: Yes Pulse Check Posterior Tibialis and Dorsalis pulse intact bilaterally: Yes Comments        Objective:    Physical Exam Constitutional:      General: She is not in acute distress.    Appearance: Normal appearance. She is well-developed.  HENT:     Head: Normocephalic and atraumatic.  Cardiovascular:     Rate and Rhythm: Normal rate.  Pulmonary:     Effort: Pulmonary effort is normal.  Musculoskeletal:     Left hip: Decreased range of motion. Decreased strength.  Skin:    General: Skin is warm and dry.     Findings: No rash.  Neurological:     Mental Status: She is alert and oriented to person, place, and time.     Deep Tendon Reflexes: Reflexes are normal and symmetric.     BP (!) 88/65   Pulse 68   Temp 97.9 F (36.6 C) (Temporal)   Ht 5' 4"  (1.626 m)   Wt 200 lb 9.6 oz (91 kg)   LMP 07/05/2010   SpO2 100%   BMI 34.43 kg/m  Wt Readings from Last 3 Encounters:  03/19/19 200 lb  9.6 oz (91 kg)  03/11/19 204 lb (92.5 kg)  02/22/19 202 lb 6.4 oz (91.8 kg)    Health Maintenance Due  Topic Date Due  . Hepatitis C Screening  [birth year]  . HIV Screening  05/16/1976  . COLONOSCOPY  05/17/2011  . PAP SMEAR-Modifier  01/24/2017  . FOOT EXAM  03/14/2017  . OPHTHALMOLOGY EXAM  05/07/2017  . MAMMOGRAM  08/08/2018  . HEMOGLOBIN A1C  03/19/2019    There are no preventive care reminders to display for this patient.   Lab Results  Component Value Date   TSH 2.730 09/16/2018   Lab Results  Component Value Date   WBC 6.4 09/16/2018   HGB 13.5 09/16/2018   HCT 41.1 09/16/2018   MCV 91 09/16/2018   PLT 268 09/16/2018   Lab Results  Component Value Date   NA 141 09/16/2018   K 4.5 09/16/2018   CO2 23 09/16/2018   GLUCOSE 164 (H) 09/16/2018   BUN 21 09/16/2018   CREATININE 1.35 (H) 09/16/2018   BILITOT 0.7 09/16/2018   ALKPHOS 65 09/16/2018   AST 13 09/16/2018   ALT 11 09/16/2018   PROT 6.6 09/16/2018   ALBUMIN 4.1 09/16/2018   CALCIUM 10.3 (H) 09/16/2018   Lab Results  Component Value Date   CHOL 178 09/16/2018   Lab Results  Component Value Date   HDL 56 09/16/2018   Lab Results  Component Value Date   LDLCALC 97 09/16/2018   Lab Results  Component Value Date   TRIG 125 09/16/2018   Lab Results  Component Value Date   CHOLHDL 3.2 09/16/2018   Lab Results  Component Value Date   HGBA1C 6.1 09/16/2018       Assessment & Plan:   Problem List Items Addressed This Visit      Cardiovascular and Mediastinum   Essential hypertension, benign     Endocrine  Diabetes ([place]) - Primary   Relevant Orders   hgba1c     Other   Mixed hyperlipidemia   Arthralgia of both hands       No orders of the defined types were placed in this encounter.    Terald Sleeper, PA-C

## 2019-03-19 NOTE — Addendum Note (Signed)
Addended by: Thana Ates on: 03/19/2019 10:56 AM   Modules accepted: Orders

## 2019-03-20 LAB — CBC WITH DIFFERENTIAL/PLATELET
Basophils Absolute: 0 10*3/uL (ref 0.0–0.2)
Basos: 1 %
EOS (ABSOLUTE): 0.3 10*3/uL (ref 0.0–0.4)
Eos: 5 %
Hematocrit: 41.9 % (ref 34.0–46.6)
Hemoglobin: 14.3 g/dL (ref 11.1–15.9)
Immature Grans (Abs): 0 10*3/uL (ref 0.0–0.1)
Immature Granulocytes: 0 %
Lymphocytes Absolute: 2 10*3/uL (ref 0.7–3.1)
Lymphs: 35 %
MCH: 31.8 pg (ref 26.6–33.0)
MCHC: 34.1 g/dL (ref 31.5–35.7)
MCV: 93 fL (ref 79–97)
Monocytes Absolute: 0.5 10*3/uL (ref 0.1–0.9)
Monocytes: 9 %
Neutrophils Absolute: 2.8 10*3/uL (ref 1.4–7.0)
Neutrophils: 50 %
Platelets: 234 10*3/uL (ref 150–450)
RBC: 4.5 x10E6/uL (ref 3.77–5.28)
RDW: 13.1 % (ref 11.7–15.4)
WBC: 5.6 10*3/uL (ref 3.4–10.8)

## 2019-03-20 LAB — CMP14+EGFR
ALT: 16 IU/L (ref 0–32)
AST: 16 IU/L (ref 0–40)
Albumin/Globulin Ratio: 1.8 (ref 1.2–2.2)
Albumin: 4.1 g/dL (ref 3.8–4.9)
Alkaline Phosphatase: 60 IU/L (ref 39–117)
BUN/Creatinine Ratio: 13 (ref 9–23)
BUN: 19 mg/dL (ref 6–24)
Bilirubin Total: 0.7 mg/dL (ref 0.0–1.2)
CO2: 22 mmol/L (ref 20–29)
Calcium: 9.3 mg/dL (ref 8.7–10.2)
Chloride: 101 mmol/L (ref 96–106)
Creatinine, Ser: 1.49 mg/dL — ABNORMAL HIGH (ref 0.57–1.00)
GFR calc Af Amer: 45 mL/min/{1.73_m2} — ABNORMAL LOW (ref >59)
GFR calc non Af Amer: 39 mL/min/{1.73_m2} — ABNORMAL LOW (ref >59)
Globulin, Total: 2.3 g/dL (ref 1.5–4.5)
Glucose: 126 mg/dL — ABNORMAL HIGH (ref 65–99)
Potassium: 3.9 mmol/L (ref 3.5–5.2)
Sodium: 139 mmol/L (ref 134–144)
Total Protein: 6.4 g/dL (ref 6.0–8.5)

## 2019-03-20 LAB — LIPID PANEL
Chol/HDL Ratio: 5.6 ratio — ABNORMAL HIGH (ref 0.0–4.4)
Cholesterol, Total: 231 mg/dL — ABNORMAL HIGH (ref 100–199)
HDL: 41 mg/dL (ref >39)
LDL Chol Calc (NIH): 139 mg/dL — ABNORMAL HIGH (ref 0–99)
Triglycerides: 280 mg/dL — ABNORMAL HIGH (ref 0–149)
VLDL Cholesterol Cal: 51 mg/dL — ABNORMAL HIGH (ref 5–40)

## 2019-03-20 LAB — MICROALBUMIN / CREATININE URINE RATIO
Creatinine, Urine: 161.6 mg/dL
Microalb/Creat Ratio: 6 mg/g creat (ref 0–29)
Microalbumin, Urine: 9.3 ug/mL

## 2019-03-20 LAB — TSH: TSH: 4.93 u[IU]/mL — ABNORMAL HIGH (ref 0.450–4.500)

## 2019-03-29 ENCOUNTER — Encounter: Payer: Self-pay | Admitting: *Deleted

## 2019-04-08 ENCOUNTER — Ambulatory Visit (INDEPENDENT_AMBULATORY_CARE_PROVIDER_SITE_OTHER): Admitting: Orthopaedic Surgery

## 2019-04-08 ENCOUNTER — Encounter: Payer: Self-pay | Admitting: Orthopaedic Surgery

## 2019-04-08 VITALS — Ht 63.0 in | Wt 206.0 lb

## 2019-04-08 DIAGNOSIS — M1612 Unilateral primary osteoarthritis, left hip: Secondary | ICD-10-CM | POA: Diagnosis not present

## 2019-04-08 NOTE — Progress Notes (Signed)
Office Visit Note   Patient: Natalie Tanner           Date of Birth: [birth year]           MRN: [id number] Visit Date: 04/08/2019              Requested by: Terald Sleeper, PA-C [address] [place],  [place] [address] PCP: Terald Sleeper, PA-C   Assessment & Plan: Visit Diagnoses:  1. Unilateral primary osteoarthritis, left hip     Plan: Patient might proceed with left total hip arthroplasty.  She does have history of coronary disease will need to be cleared by cardiology.  Last A1c 2 weeks ago is good.  Plan would be spinal anesthesia overnight stay in the hospital.  We discussed direct anterior exposure use of crutches versus walker.  Questions were elicited and answered.  Risks of surgery discussed including femoral fracture time of surgery with anterior exposure.  Risks of anesthesia.  Spinal anesthesia recommended.  Follow-Up Instructions: No follow-ups on file.   Orders:  No orders of the defined types were placed in this encounter.  No orders of the defined types were placed in this encounter.     Procedures: No procedures performed   Clinical Data: No additional findings.   Subjective: Chief Complaint  Patient presents with   Left Hip - Pain, Follow-up    HPI 59 year old female returns with left hip osteoarthritis progressively painful with increased difficulty walking.  We discussed a single intra-articular injection but she states pain is severe enough and she is talked with her family and would like to proceed with left total hip arthroplasty.  Patient has no joint space bone-on-bone changes with subchondral sclerosis small marginal osteophytes in her left hip.  No arthritis in the opposite right hip.  She has had problems with right Achilles tendinopathy which has been a little worse since she has been using a cane and limping due to her left hip.  Patient is followed at [facility] and was referred here by Particia Nearing, PA.  Patient been on anti-inflammatories as well as  gabapentin for her hip without relief.  Previous lumbar surgery with Dr. Carloyn Manner in the past L5-S1 not currently symptomatic.  She has had great difficulty at work to the amount of standing and walking that required.  No history of crystal arthropathy.  Review of Systems Previous lumbar surgery Dr. Carloyn Manner L5-S1 2014.  Kidney surgery 2013.  Positive history of depression diabetes anxiety she denies suicide ideation.  Positive for hypertension controlled.  Previous CVA also acid reflux.  Hemoglobin A1c 2 weeks ago was 6.2.  Plus for diagnosis of coronary artery disease single vessel.  She saw Dr. Rozann Lesches cardiology last visit several years ago.  Objective: Vital Signs: Ht 5\' 3"  (1.6 m)    Wt 206 lb (93.4 kg)    LMP 07/05/2010    BMI 36.49 kg/m   Physical Exam Constitutional:      Appearance: She is well-developed.  HENT:     Head: Normocephalic.     Right Ear: External ear normal.     Left Ear: External ear normal.  Eyes:     Pupils: Pupils are equal, round, and reactive to light.  Neck:     Thyroid: No thyromegaly.     Trachea: No tracheal deviation.  Cardiovascular:     Rate and Rhythm: Normal rate.  Pulmonary:     Effort: Pulmonary effort is normal.  Abdominal:     Palpations:  Abdomen is soft.  Skin:    General: Skin is warm and dry.  Neurological:     Mental Status: She is alert and oriented to person, place, and time.  Psychiatric:        Behavior: Behavior normal.     Ortho Exam patient has more pronounced left Trendelenburg gait than last month.  Internal rotation only 10 degrees left hip with sharp groin pain.  Right hip 20 to 30 degrees internal rotation without pain.  She cannot figure 4.  Distal pulses are intact no sciatic notch tenderness trochanteric bursa is normal.  No hip flexion contracture.  Specialty Comments:  No specialty comments available.  Imaging: No results found.   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   Coronary atherosclerosis of native coronary artery 08/13/2010   Diabetes ([place]) 08/13/2010   Mixed hyperlipidemia 08/13/2010   Essential hypertension, benign 08/13/2010   Iron deficiency anemia 08/13/2010   Past Medical History:  Diagnosis Date   Anxiety    Coronary atherosclerosis of native coronary artery    Managed medically following cardiac catheterization 5/12   Depression    Essential hypertension, benign    GERD (gastroesophageal reflux disease)    Iron deficiency anemia    Secondary to menometrorrhagia   Mixed hyperlipidemia    Myocardial infarction [facility])    NSTEMI 5/12   Nephrolithiasis    Type 2 diabetes mellitus ([place])     Family History  Problem Relation Age of Onset   Other Father        Clostridium difficile   Stroke Father     Past Surgical History:  Procedure Laterality Date   BACK SURGERY     KNEE SURGERY     SINUS SURGERY WITH INSTATRAK     Social History   Occupational History   Occupation: [employer]  Tobacco Use   Smoking status: Former Smoker    Packs/day: 1.00    Years: 20.00    Pack years: 20.00    Types: Cigarettes    Quit date: 03/04/2001    Years since quitting: 18.1   Smokeless tobacco: Never Used  Substance and Sexual Activity   Alcohol use: No   Drug use: No   Sexual activity: Yes

## 2019-04-16 ENCOUNTER — Encounter: Payer: Self-pay | Admitting: Physician Assistant

## 2019-04-16 ENCOUNTER — Ambulatory Visit (INDEPENDENT_AMBULATORY_CARE_PROVIDER_SITE_OTHER): Admitting: Physician Assistant

## 2019-04-16 ENCOUNTER — Other Ambulatory Visit: Payer: Self-pay

## 2019-04-16 VITALS — BP 96/60 | HR 74 | Temp 99.3°F | Ht 63.0 in | Wt 199.0 lb

## 2019-04-16 DIAGNOSIS — I10 Essential (primary) hypertension: Secondary | ICD-10-CM | POA: Diagnosis not present

## 2019-04-16 MED ORDER — METOPROLOL TARTRATE 25 MG PO TABS: 25.0000 mg | ORAL_TABLET | Freq: Two times a day (BID) | ORAL | 11 refills | Status: DC

## 2019-04-16 NOTE — Progress Notes (Signed)
Subjective:    Patient ID: Natalie Tanner, female    DOB: [birth year], 59 y.o.   MRN: [id number]  Chief Complaint  Patient presents with  . Medical Management of Chronic Issues    4w rech BP.     Hypertension This is a chronic problem. The problem is controlled. Pertinent negatives include no chest pain, malaise/fatigue, neck pain, orthopnea, palpitations or shortness of breath. There are no associated agents to hypertension. Risk factors for coronary artery disease include diabetes mellitus and dyslipidemia. Past treatments include ACE inhibitors and beta blockers. The current treatment provides significant improvement. Compliance problems include medication side effects.   Diabetes She presents for her follow-up diabetic visit. She has type 2 diabetes mellitus. There are no diabetic associated symptoms. Pertinent negatives for diabetes include no chest pain and no fatigue. There are no hypoglycemic complications. There are no diabetic complications. Risk factors for coronary artery disease include diabetes mellitus, dyslipidemia and hypertension. Current diabetic treatment includes oral agent (triple therapy). She is compliant with treatment all of the time. She is following a diabetic diet.  Hyperlipidemia This is a chronic problem. The problem is uncontrolled. Exacerbating diseases include diabetes. Pertinent negatives include no chest pain or shortness of breath. Risk factors for coronary artery disease include diabetes mellitus, dyslipidemia, hypertension and post-menopausal.   The patient had a hypotension still going on, we are going to lower her metoprolol to 25 mg twice daily new prescription has been sent in.  The next plan of action is to lower the Zestril from 10 mg to 5 mg.  Past Medical History:  Diagnosis Date  . Anxiety   . Coronary atherosclerosis of native coronary artery    Managed medically following cardiac catheterization 5/12  . Depression   . Essential  hypertension, benign   . GERD (gastroesophageal reflux disease)   . Iron deficiency anemia    Secondary to menometrorrhagia  . Mixed hyperlipidemia   . Myocardial infarction [facility])    NSTEMI 5/12  . Nephrolithiasis   . Type 2 diabetes mellitus ([place])     Past Surgical History:  Procedure Laterality Date  . BACK SURGERY    . KNEE SURGERY    . SINUS SURGERY WITH INSTATRAK      Family History  Problem Relation Age of Onset  . Other Father        Clostridium difficile  . Stroke Father     Social History   Socioeconomic History  . Marital status: Married    Spouse name: Not on file  . Number of children: 2  . Years of education: 60  . Highest education level: Not on file  Occupational History  . Occupation: [employer]  Tobacco Use  . Smoking status: Former Smoker    Packs/day: 1.00    Years: 20.00    Pack years: 20.00    Types: Cigarettes    Quit date: 03/04/2001    Years since quitting: 18.1  . Smokeless tobacco: Never Used  Substance and Sexual Activity  . Alcohol use: No  . Drug use: No  . Sexual activity: Yes  Other Topics Concern  . Not on file  Social History Narrative   Drinks about 2L of soda a day    Social Determinants of Health   Financial Resource Strain:   . Difficulty of Paying Living Expenses: Not on file  Food Insecurity:   . Worried About Charity fundraiser in the Last Year: Not on file  . Ran Out  of Food in the Last Year: Not on file  Transportation Needs:   . Lack of Transportation (Medical): Not on file  . Lack of Transportation (Non-Medical): Not on file  Physical Activity:   . Days of Exercise per Week: Not on file  . Minutes of Exercise per Session: Not on file  Stress:   . Feeling of Stress : Not on file  Social Connections:   . Frequency of Communication with Friends and Family: Not on file  . Frequency of Social Gatherings with Friends and Family: Not on file  . Attends Religious Services: Not on file  . Active Member of Clubs or  Organizations: Not on file  . Attends Archivist Meetings: Not on file  . Marital Status: Not on file  Intimate Partner Violence:   . Fear of Current or Ex-Partner: Not on file  . Emotionally Abused: Not on file  . Physically Abused: Not on file  . Sexually Abused: Not on file    Outpatient Medications Prior to Visit  Medication Sig Dispense Refill  . ALPRAZolam (XANAX) 1 MG tablet Take 1 tablet (1 mg total) by mouth 2 (two) times daily as needed. 180 tablet 1  . blood glucose meter kit and supplies KIT Dispense based on patient and insurance preference. Use up to four times daily as directed. For diabetes E11.9, with hyperglycemia. 90 each 11  . empagliflozin (JARDIANCE) 25 MG TABS tablet Take 25 mg by mouth daily. 90 tablet 3  . gabapentin (NEURONTIN) 300 MG capsule Take 1 capsule (300 mg total) by mouth 3 (three) times daily. 90 capsule 3  . glipiZIDE (GLUCOTROL) 10 MG tablet Take 1 tablet (10 mg total) by mouth daily. 90 tablet 3  . isosorbide mononitrate (IMDUR) 30 MG 24 hr tablet Take 1 tablet (30 mg total) by mouth 2 (two) times daily. 180 tablet 3  . lisinopril (ZESTRIL) 10 MG tablet Take 1 tablet (10 mg total) by mouth daily. 90 tablet 0  . meclizine (ANTIVERT) 25 MG tablet Take 1 tablet (25 mg total) by mouth 3 (three) times daily as needed for dizziness. 90 tablet 11  . nitroGLYCERIN (NITROSTAT) 0.4 MG SL tablet Place 1 tablet (0.4 mg total) under the tongue every 5 (five) minutes as needed for chest pain. 25 tablet 2  . omeprazole (PRILOSEC OTC) 20 MG tablet Take 1 tablet (20 mg total) by mouth daily. 90 tablet 3  . PARoxetine (PAXIL) 20 MG tablet Take 1 tablet (20 mg total) by mouth every morning. 90 tablet 3  . pioglitazone (ACTOS) 30 MG tablet Take 1 tablet (30 mg total) by mouth daily. 90 tablet 1  . pravastatin (PRAVACHOL) 40 MG tablet Take 1 tablet (40 mg total) by mouth daily. 90 tablet 3  . sitaGLIPtin (JANUVIA) 100 MG tablet Take 1 tablet (100 mg total) by  mouth daily. 90 tablet 1  . aspirin 81 MG tablet Take 81 mg by mouth daily.      Marland Kitchen ibuprofen (ADVIL) 800 MG tablet Take 800 mg by mouth every 8 (eight) hours as needed.    . metoprolol tartrate (LOPRESSOR) 50 MG tablet Take 1 tablet (50 mg total) by mouth 2 (two) times daily. 180 tablet 3   No facility-administered medications prior to visit.    No Known Allergies  Review of Systems  Constitutional: Negative.  Negative for activity change, fatigue, fever and malaise/fatigue.  HENT: Negative.   Eyes: Negative.   Respiratory: Negative.  Negative for cough, shortness of breath  and wheezing.   Cardiovascular: Negative.  Negative for chest pain, palpitations, orthopnea and leg swelling.  Gastrointestinal: Negative.  Negative for abdominal pain.  Endocrine: Negative.   Genitourinary: Negative.  Negative for dysuria.  Musculoskeletal: Positive for arthralgias and gait problem. Negative for neck pain.  Skin: Negative.        Objective:    Physical Exam Constitutional:      General: She is not in acute distress.    Appearance: Normal appearance. She is well-developed.  HENT:     Head: Normocephalic and atraumatic.  Cardiovascular:     Rate and Rhythm: Normal rate and regular rhythm.     Heart sounds: Normal heart sounds.  Pulmonary:     Effort: Pulmonary effort is normal.     Breath sounds: Normal breath sounds.  Skin:    General: Skin is warm and dry.     Findings: No rash.  Neurological:     Mental Status: She is alert and oriented to person, place, and time.     Deep Tendon Reflexes: Reflexes are normal and symmetric.     BP 96/60   Pulse 74   Temp 99.3 F (37.4 C)   Ht 5' 3"  (1.6 m)   Wt 199 lb (90.3 kg)   LMP 07/05/2010   SpO2 98%   BMI 35.25 kg/m  Wt Readings from Last 3 Encounters:  04/16/19 199 lb (90.3 kg)  04/08/19 206 lb (93.4 kg)  03/19/19 200 lb 9.6 oz (91 kg)    Health Maintenance Due  Topic Date Due  . Hepatitis C Screening  01-20-1961  . HIV  Screening  05/16/1976  . COLONOSCOPY  05/17/2011  . PAP SMEAR-Modifier  01/24/2017  . OPHTHALMOLOGY EXAM  05/07/2017  . MAMMOGRAM  08/08/2018    There are no preventive care reminders to display for this patient.   Lab Results  Component Value Date   TSH 4.930 (H) 03/19/2019   Lab Results  Component Value Date   WBC 5.6 03/19/2019   HGB 14.3 03/19/2019   HCT 41.9 03/19/2019   MCV 93 03/19/2019   PLT 234 03/19/2019   Lab Results  Component Value Date   NA 139 03/19/2019   K 3.9 03/19/2019   CO2 22 03/19/2019   GLUCOSE 126 (H) 03/19/2019   BUN 19 03/19/2019   CREATININE 1.49 (H) 03/19/2019   BILITOT 0.7 03/19/2019   ALKPHOS 60 03/19/2019   AST 16 03/19/2019   ALT 16 03/19/2019   PROT 6.4 03/19/2019   ALBUMIN 4.1 03/19/2019   CALCIUM 9.3 03/19/2019   Lab Results  Component Value Date   CHOL 231 (H) 03/19/2019   Lab Results  Component Value Date   HDL 41 03/19/2019   Lab Results  Component Value Date   LDLCALC 139 (H) 03/19/2019   Lab Results  Component Value Date   TRIG 280 (H) 03/19/2019   Lab Results  Component Value Date   CHOLHDL 5.6 (H) 03/19/2019   Lab Results  Component Value Date   HGBA1C 6.2 03/19/2019       Assessment & Plan:   Problem List Items Addressed This Visit      Cardiovascular and Mediastinum   Essential hypertension, benign - Primary   Relevant Medications   metoprolol tartrate (LOPRESSOR) 25 MG tablet     Hypotension was going on, lower lopressor to 25 mg twice daily  Next plan of action is to lower Zestril from 10 mg to 5 mg  Meds ordered this encounter  Medications  . metoprolol tartrate (LOPRESSOR) 25 MG tablet    Sig: Take 1 tablet (25 mg total) by mouth 2 (two) times daily.    Dispense:  180 tablet    Refill:  11    Order Specific Question:   Supervising Provider    Answer:   Janora Norlander [[id number]]    The patient is also looking toward hip replacement surgery.  She will be needing cardiac clearance.   At this point all her labs and physicals here have been good.   Terald Sleeper, PA-C   Terald Sleeper PA-C [place] [address]  [place], [place] [address] [phone]

## 2019-04-19 ENCOUNTER — Ambulatory Visit: Admitting: Physician Assistant

## 2019-04-28 NOTE — Progress Notes (Addendum)
[facility] - [place], [place] [address] Phone: [phone] Fax: [phone]  [place], [place] [address] Phone: [phone] Fax: [phone]      Your procedure is scheduled on Friday, March 5th.  Report to [facility] Main Entrance "A" at 8:00 A.M., and check in at the Admitting office.  Call this number if you have problems the morning of surgery:  [phone]   Call [phone] if you have any questions prior to your surgery date Monday-Friday 8am-4pm    Remember:  Do not eat after midnight the night before your surgery  You may drink clear liquids until 7:00 AM the morning of your surgery.   Clear liquids allowed are: Water, Non-Citrus Juices (without pulp), Carbonated Beverages, Clear Tea, Black Coffee Only, and Gatorade   Enhanced Recovery after Surgery for Orthopedics Enhanced Recovery after Surgery is a protocol used to improve the stress on your body and your recovery after surgery.  Patient Instructions  . The night before surgery:  o No food after midnight. ONLY clear liquids after midnight  . The day of surgery (if you have diabetes): o Drink water as directed. o Water was given to you during your hospital  pre-op appointment visit.  o The pre-op nurse will instruct you on the time to drink the  water depending on your surgery time. o Nothing else to drink after completing the  water.         If you have questions, please contact your surgeon's office.     Take these medicines the morning of surgery with A SIP OF WATER   Alprazolam (Xanax)  Gabapentin (Neurontin)  Isosorbide Mononitrate (Imdur)  Meclizine (Antivert)  Metoprolol (Lopressor)  Nitroglycerin - if needed  Paroxetine (Paxil)  Pravastatin (Pravachol)     7 days prior to surgery STOP taking any Aspirin (unless otherwise instructed by your surgeon), Aleve, Naproxen,  Ibuprofen, Motrin, Advil, Goody's, BC's, all herbal medications, fish oil, and all vitamins.   WHAT DO I DO ABOUT MY DIABETES MEDICATION?   Marland Kitchen Do not take oral diabetes medicines (pills) the morning of surgery.        - Glipizide, Actos, Januvia  . THE DAY BEFORE SURGERY, do not take Jardiance.  . THE MORNING OF SURGERY, do not take Jardiance.  . The day of surgery, do not take other diabetes injectables, including Byetta (exenatide), Bydureon (exenatide ER), Victoza (liraglutide), or Trulicity (dulaglutide).  . If your CBG is greater than 220 mg/dL, you may take  of your sliding scale (correction) dose of insulin.   HOW TO MANAGE YOUR DIABETES BEFORE AND AFTER SURGERY  Why is it important to control my blood sugar before and after surgery? . Improving blood sugar levels before and after surgery helps healing and can limit problems. . A way of improving blood sugar control is eating a healthy diet by: o  Eating less sugar and carbohydrates o  Increasing activity/exercise o  Talking with your doctor about reaching your blood sugar goals . High blood sugars (greater than 180 mg/dL) can raise your risk of infections and slow your recovery, so you will need to focus on controlling your diabetes during the weeks before surgery. . Make sure that the doctor who takes care of your diabetes knows about your planned surgery including the date and location.  How do I manage my blood sugar before surgery? . Check  your blood sugar at least 4 times a day, starting 2 days before surgery, to make sure that the level is not too high or low. . Check your blood sugar the morning of your surgery when you wake up and every 2 hours until you get to the Short Stay unit. o If your blood sugar is less than 70 mg/dL, you will need to treat for low blood sugar: - Do not take insulin. - Treat a low blood sugar (less than 70 mg/dL) with  cup of clear juice (cranberry or apple), 4 glucose tablets, OR glucose  gel. - Recheck blood sugar in 15 minutes after treatment (to make sure it is greater than 70 mg/dL). If your blood sugar is not greater than 70 mg/dL on recheck, call [phone] for further instructions. . Report your blood sugar to the short stay nurse when you get to Short Stay.  . If you are admitted to the hospital after surgery: o Your blood sugar will be checked by the staff and you will probably be given insulin after surgery (instead of oral diabetes medicines) to make sure you have good blood sugar levels. o The goal for blood sugar control after surgery is 80-180 mg/dL.    The Morning of Surgery  Do not wear jewelry, make-up or nail polish.  Do not wear lotions, powders, or perfumes, or deodorant  Do not shave 48 hours prior to surgery.    Do not bring valuables to the hospital.  [facility] is not responsible for any belongings or valuables.  If you are a smoker, DO NOT Smoke 24 hours prior to surgery  If you wear a CPAP at night please bring your mask the morning of surgery   Remember that you must have someone to transport you home after your surgery, and remain with you for 24 hours if you are discharged the same day.   Please bring cases for contacts, glasses, hearing aids, dentures or bridgework because it cannot be worn into surgery.    Leave your suitcase in the car.  After surgery it may be brought to your room.  For patients admitted to the hospital, discharge time will be determined by your treatment team.  Patients discharged the day of surgery will not be allowed to drive home.    Special instructions:   [facility]- Preparing For Surgery  Before surgery, you can play an important role. Because skin is not sterile, your skin needs to be as free of germs as possible. You can reduce the number of germs on your skin by washing with CHG (chlorahexidine gluconate) Soap before surgery.  CHG is an antiseptic cleaner which kills germs and bonds with the skin to  continue killing germs even after washing.    Oral Hygiene is also important to reduce your risk of infection.  Remember - BRUSH YOUR TEETH THE MORNING OF SURGERY WITH YOUR REGULAR TOOTHPASTE  Please do not use if you have an allergy to CHG or antibacterial soaps. If your skin becomes reddened/irritated stop using the CHG.  Do not shave (including legs and underarms) for at least 48 hours prior to first CHG shower. It is OK to shave your face.  Please follow these instructions carefully.   1. Shower the NIGHT BEFORE SURGERY and the MORNING OF SURGERY with CHG Soap.   2. If you chose to wash your hair, wash your hair first as usual with your normal shampoo.  3. After you shampoo, rinse your hair and body  thoroughly to remove the shampoo.  4. Use CHG as you would any other liquid soap. You can apply CHG directly to the skin and wash gently with a scrungie or a clean washcloth.   5. Apply the CHG Soap to your body ONLY FROM THE NECK DOWN.  Do not use on open wounds or open sores. Avoid contact with your eyes, ears, mouth and genitals (private parts). Wash Face and genitals (private parts)  with your normal soap.   6. Wash thoroughly, paying special attention to the area where your surgery will be performed.  7. Thoroughly rinse your body with warm water from the neck down.  8. DO NOT shower/wash with your normal soap after using and rinsing off the CHG Soap.  9. Pat yourself dry with a CLEAN TOWEL.  10. Wear CLEAN PAJAMAS to bed the night before surgery, wear comfortable clothes the morning of surgery  11. Place CLEAN SHEETS on your bed the night of your first shower and DO NOT SLEEP WITH PETS.    Day of Surgery:  Please shower the morning of surgery with the CHG soap Do not apply any deodorants/lotions. Please wear clean clothes to the hospital/surgery center.   Remember to brush your teeth WITH YOUR REGULAR TOOTHPASTE.   Please read over the following fact sheets that you were  given.

## 2019-04-29 ENCOUNTER — Telehealth: Payer: Self-pay | Admitting: Orthopaedic Surgery

## 2019-04-29 ENCOUNTER — Encounter (HOSPITAL_COMMUNITY)
Admission: RE | Admit: 2019-04-29 | Discharge: 2019-04-29 | Disposition: A | Discharge status: Home / Self Care | Source: Ambulatory Visit | Attending: Orthopaedic Surgery | Admitting: Orthopaedic Surgery

## 2019-04-29 ENCOUNTER — Other Ambulatory Visit: Payer: Self-pay

## 2019-04-29 ENCOUNTER — Encounter (HOSPITAL_COMMUNITY): Payer: Self-pay

## 2019-04-29 ENCOUNTER — Telehealth: Payer: Self-pay | Admitting: Physician Assistant

## 2019-04-29 ENCOUNTER — Encounter: Payer: Self-pay | Admitting: Surgery

## 2019-04-29 ENCOUNTER — Ambulatory Visit (HOSPITAL_COMMUNITY)
Admission: RE | Admit: 2019-04-29 | Discharge: 2019-04-29 | Disposition: A | Discharge status: Home / Self Care | Source: Ambulatory Visit | Attending: Surgery | Admitting: Surgery

## 2019-04-29 ENCOUNTER — Ambulatory Visit (INDEPENDENT_AMBULATORY_CARE_PROVIDER_SITE_OTHER): Admitting: Surgery

## 2019-04-29 VITALS — Ht 63.0 in | Wt 203.0 lb

## 2019-04-29 DIAGNOSIS — M1612 Unilateral primary osteoarthritis, left hip: Secondary | ICD-10-CM

## 2019-04-29 DIAGNOSIS — Z01818 Encounter for other preprocedural examination: Secondary | ICD-10-CM | POA: Insufficient documentation

## 2019-04-29 DIAGNOSIS — M1712 Unilateral primary osteoarthritis, left knee: Secondary | ICD-10-CM

## 2019-04-29 HISTORY — DX: Personal history of urinary calculi: Z87.442

## 2019-04-29 LAB — COMPREHENSIVE METABOLIC PANEL
ALT: 15 U/L (ref 0–44)
AST: 25 U/L (ref 15–41)
Albumin: 3.6 g/dL (ref 3.5–5.0)
Alkaline Phosphatase: 51 U/L (ref 38–126)
Anion gap: 11 (ref 5–15)
BUN: 17 mg/dL (ref 6–20)
CO2: 24 mmol/L (ref 22–32)
Calcium: 9 mg/dL (ref 8.9–10.3)
Chloride: 106 mmol/L (ref 98–111)
Creatinine, Ser: 1.27 mg/dL — ABNORMAL HIGH (ref 0.44–1.00)
GFR calc Af Amer: 54 mL/min — ABNORMAL LOW (ref >60)
GFR calc non Af Amer: 47 mL/min — ABNORMAL LOW (ref >60)
Glucose, Bld: 85 mg/dL (ref 70–99)
Potassium: 4.6 mmol/L (ref 3.5–5.1)
Sodium: 141 mmol/L (ref 135–145)
Total Bilirubin: 1 mg/dL (ref 0.3–1.2)
Total Protein: 6.3 g/dL — ABNORMAL LOW (ref 6.5–8.1)

## 2019-04-29 LAB — CBC
HCT: 42.8 % (ref 36.0–46.0)
Hemoglobin: 13.6 g/dL (ref 12.0–15.0)
MCH: 31.6 pg (ref 26.0–34.0)
MCHC: 31.8 g/dL (ref 30.0–36.0)
MCV: 99.3 fL (ref 80.0–100.0)
Platelets: 253 10*3/uL (ref 150–400)
RBC: 4.31 MIL/uL (ref 3.87–5.11)
RDW: 13.3 % (ref 11.5–15.5)
WBC: 6.8 10*3/uL (ref 4.0–10.5)
nRBC: 0 % (ref 0.0–0.2)

## 2019-04-29 LAB — URINALYSIS, ROUTINE W REFLEX MICROSCOPIC
Bilirubin Urine: NEGATIVE
Glucose, UA: >=500 mg/dL — AB
Hgb urine dipstick: NEGATIVE
Ketones, ur: NEGATIVE mg/dL
Leukocytes,Ua: NEGATIVE
Nitrite: NEGATIVE
Protein, ur: NEGATIVE mg/dL
Specific Gravity, Urine: 1.028 (ref 1.005–1.030)
pH: 5 (ref 5.0–8.0)

## 2019-04-29 LAB — GLUCOSE, CAPILLARY: Glucose-Capillary: 143 mg/dL — ABNORMAL HIGH (ref 70–99)

## 2019-04-29 LAB — SURGICAL PCR SCREEN
MRSA, PCR: NEGATIVE
Staphylococcus aureus: POSITIVE — AB

## 2019-04-29 NOTE — Telephone Encounter (Signed)
Spoke with Lynnea Ferrier, she reports that the form is on Angel's desk to be signed.  Contacted orthopedic office and let them know this.

## 2019-04-29 NOTE — Progress Notes (Signed)
PCP - Terald Sleeper, PA-C Cardiologist - Denies  PPM/ICD - Denies  Chest x-ray - 04/29/19 EKG - 04/29/19 Stress Test - Denies ECHO - Unknown, believes she may have had one by Dr. Brigitte Pulse at [facility]. Records requested. Cardiac Cath - Denies  Sleep Study - Denies  Fasting Blood Sugar - 130-140 (during office visits) Per patient, she does not check her blood sugar.   Blood Thinner Instructions: N/A Aspirin Instructions: N/A  ERAS Protcol - Yes PRE-SURGERY Ensure or G2- 10 oz Water given  COVID TEST- 05/04/19   Anesthesia review: Yes, cardiac hx.  Patient denies shortness of breath, fever, cough and chest pain at PAT appointment   All instructions explained to the patient, with a verbal understanding of the material. Patient agrees to go over the instructions while at home for a better understanding. Patient also instructed to self quarantine after being tested for COVID-19. The opportunity to ask questions was provided.

## 2019-04-29 NOTE — Progress Notes (Signed)
Several attempts made to request Echo results from [facility]. Fax shown as "fail". [facility] to verify fax number, which is the same number used to request documents during previous attempts. Office states they have been busy receiving faxes all day and that we should keep trying.

## 2019-04-29 NOTE — Progress Notes (Signed)
59 year old white female with history of end-stage DJD left hip and pain comes in for preop evaluation.  States that hip symptoms unchanged from previous visit.  She is want to proceed with left total hip replacement as scheduled.  Dr. Lorin Mercy had requested preop cardiac clearance.  I have reviewed patient's chart and we do not have that as of yet.  Past medical history significant for myocardial infarction 2012.  Patient self discontinued cardiology visits and has not been seen by them in several years.  Has not had any cardiac work-up since 2012.  PCP office is handling patients cardiac medication.  Today history and physical performed.  Patient denies any chest pain, shortness of breath.  Plan Our surgery scheduler contacted PCP office and they stated they will forward a medical and cardiac clearance through their office.  I did speak with Dr. Lorin Mercy about this after patient left our clinic and he is wanting to proceed with having her see [facility] cardiology for an official clearance from them.  Our surgery scheduler will try to help arrange this and she will contact patient to let her know.  I did discuss surgery stage with patient along with potential recovery time.  Anticipate patient being out of work at least 8 to 12 weeks postop.  All questions answered.

## 2019-04-29 NOTE — Telephone Encounter (Signed)
Left message on patient's voice mail asking her to call our office. Patient was seen for H&P today in our office.  Patient stated she had been seen by Particia Nearing, PA-C on 04-16-19 and was cleared from both a medical and cardiac standpoint. Request for clearance was sent to [place] back on 04-20-19. As of today's H&P visit we have not received anything from patient's PCP. Office visit note from PCP indicated patient had been seen on the 12th of Feb and mentioned the hip replacement surgery and the need for cardiac clearance.  In an attempt to keep the surgery date of March 5th, I agreed to hold date for patient until dictation was complete by PCP.  Today another request for clearance was sent to PCP and is noted in Epic on her desk to be signed.  At the request of Benjiman Core, PA a cardiology appointment has been made for the purpose of surgical clearance. Patient's surgery has been cancelled and will be rescheduled after cardiac clearance is obtained. Patient has an appointment with Dr. Rozann Lesches in [place] on 05-25-19@ 1:30 made by Debroah Baller.  PCP office was contacted and I spoke with Roselyn Reef, updating her on status of Cardiology appointment with Dr. Domenic Polite in [place] .

## 2019-04-29 NOTE — Telephone Encounter (Signed)
FYI

## 2019-04-29 NOTE — Progress Notes (Signed)
PCR positive for staph. Called patient's phone, but it rang out. Left VM letting patient know of results and to pick up Mupirocin from [place] and to use 2x daily for 5 days prior to surgery.

## 2019-04-30 ENCOUNTER — Telehealth: Payer: Self-pay | Admitting: Orthopaedic Surgery

## 2019-04-30 NOTE — Telephone Encounter (Signed)
I spoke with Natalie Tanner at [facility] today regarding an earlier appointment for this patient. Patient would like to proceed with Left total hip arthroplasty March 5th at [facility]. Appointment was made with cardiologist Dr. Oswaldo Milian ([facility] location) for Monday March 1st at 8:20 for surgical clearance.   Patient is aware of location, date, and time and has agreed to keep her appointment. Natalie Caldwell, PA with [facility] Short Stay Anesthesia has also been made aware of this appointment.

## 2019-04-30 NOTE — Telephone Encounter (Signed)
Called several times, finally reached her . Told her again needs cardiology clearance , she will talk with Jackelyn Poling this afternoon .

## 2019-04-30 NOTE — Telephone Encounter (Signed)
noted 

## 2019-04-30 NOTE — Telephone Encounter (Signed)
Referral entered to Cardiology

## 2019-05-02 NOTE — Progress Notes (Signed)
Cardiology Office Note:    Date:  05/03/2019   ID:  Natalie Tanner, DOB Aug 10, 1960, MRN [id number]  PCP:  Terald Sleeper, PA-C  Cardiologist:  No primary care provider on file.  Electrophysiologist:  None   Referring MD: Terald Sleeper, PA-C   Chief Complaint  Patient presents with  . Pre-op Exam    History of Present Illness:    Natalie Tanner is a 59 y.o. female with a hx of CAD, type 2 diabetes, hyperlipidemia who is referred by Particia Nearing, PA-C for preoperative evaluation prior to hip surgery.  Surgery planned for 05/07/18.  She was admitted to [facility] in May 2012 with NSTEMI.  Underwent cardiac catheterization that showed small-vessel branch disease with 80% stenosis in the superior branch of the first diagonal, 90% stenosis in the inferior branch of the first diagonal, 90% stenosis in the second diagonal with 99% stenosis in a more distal branch of the same vessel, 99% stenosis in the third diagonal.  First obtuse marginal has tandem 99% stenosis while the second obtuse marginal has 99% stenosis.  Third obtuse marginal with scattered 99% stenosis.  Distal circumflex with 70% stenosis prior to the third obtuse marginal.  The right coronary artery was widely  patent.  The LAD was also widely patent.  EF was greater than 55% with normal wall motion.  Anatomy was not felt to be amenable for PCI or CABG secondary to small-vessel disease, and medical management recommended.  She denies any chest pain.  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  She denies any exertional chest pain or dyspnea.  States that she can walk up a flight of stairs without stopping.  She quit smoking years ago.  Reports BP has been well controlled.   Past Medical History:  Diagnosis Date  . Anginal pain ([place])   . Anxiety   . Coronary atherosclerosis of native coronary artery    Managed medically following cardiac catheterization 5/12  . Depression   . Essential  hypertension, benign   . GERD (gastroesophageal reflux disease)   . History of kidney stones   . Iron deficiency anemia    Secondary to menometrorrhagia  . Mixed hyperlipidemia   . Myocardial infarction [facility])    NSTEMI 5/12  . Nephrolithiasis   . Stroke ([place]) 2017  . Type 2 diabetes mellitus ([place])     Past Surgical History:  Procedure Laterality Date  . BACK SURGERY    . KNEE SURGERY    . SINUS SURGERY WITH INSTATRAK      Current Medications: Current Meds  Medication Sig  . ALPRAZolam (XANAX) 1 MG tablet Take 1 tablet (1 mg total) by mouth 2 (two) times daily as needed.  . blood glucose meter kit and supplies KIT Dispense based on patient and insurance preference. Use up to four times daily as directed. For diabetes E11.9, with hyperglycemia.  Marland Kitchen empagliflozin (JARDIANCE) 25 MG TABS tablet Take 25 mg by mouth daily.  Marland Kitchen gabapentin (NEURONTIN) 300 MG capsule Take 1 capsule (300 mg total) by mouth 3 (three) times daily.  Marland Kitchen glipiZIDE (GLUCOTROL) 10 MG tablet Take 1 tablet (10 mg total) by mouth daily.  . isosorbide mononitrate (IMDUR) 30 MG 24 hr tablet Take 1 tablet (30 mg total) by mouth 2 (two) times daily.  Marland Kitchen lisinopril (ZESTRIL) 10 MG tablet Take 1 tablet (10 mg total) by mouth daily.  . meclizine (ANTIVERT) 25 MG tablet Take 1 tablet (25  mg total) by mouth 3 (three) times daily as needed for dizziness.  . metoprolol tartrate (LOPRESSOR) 25 MG tablet Take 1 tablet (25 mg total) by mouth 2 (two) times daily.  . nitroGLYCERIN (NITROSTAT) 0.4 MG SL tablet Place 1 tablet (0.4 mg total) under the tongue every 5 (five) minutes as needed for chest pain.  Marland Kitchen omeprazole (PRILOSEC OTC) 20 MG tablet Take 1 tablet (20 mg total) by mouth daily.  Marland Kitchen PARoxetine (PAXIL) 20 MG tablet Take 1 tablet (20 mg total) by mouth every morning.  . pioglitazone (ACTOS) 30 MG tablet Take 1 tablet (30 mg total) by mouth daily.  . sitaGLIPtin (JANUVIA) 100 MG tablet Take 1 tablet (100 mg total) by mouth daily.    . [DISCONTINUED] pravastatin (PRAVACHOL) 40 MG tablet Take 1 tablet (40 mg total) by mouth daily.     Allergies:   Patient has no known allergies.   Social History   Socioeconomic History  . Marital status: Married    Spouse name: Not on file  . Number of children: 2  . Years of education: 37  . Highest education level: Not on file  Occupational History  . Occupation: [employer]  Tobacco Use  . Smoking status: Former Smoker    Packs/day: 1.00    Years: 20.00    Pack years: 20.00    Types: Cigarettes    Quit date: 03/04/2001    Years since quitting: 18.1  . Smokeless tobacco: Never Used  Substance and Sexual Activity  . Alcohol use: No  . Drug use: No  . Sexual activity: Yes  Other Topics Concern  . Not on file  Social History Narrative   Drinks about 2L of soda a day    Social Determinants of Health   Financial Resource Strain:   . Difficulty of Paying Living Expenses: Not on file  Food Insecurity:   . Worried About Charity fundraiser in the Last Year: Not on file  . Ran Out of Food in the Last Year: Not on file  Transportation Needs:   . Lack of Transportation (Medical): Not on file  . Lack of Transportation (Non-Medical): Not on file  Physical Activity:   . Days of Exercise per Week: Not on file  . Minutes of Exercise per Session: Not on file  Stress:   . Feeling of Stress : Not on file  Social Connections:   . Frequency of Communication with Friends and Family: Not on file  . Frequency of Social Gatherings with Friends and Family: Not on file  . Attends Religious Services: Not on file  . Active Member of Clubs or Organizations: Not on file  . Attends Archivist Meetings: Not on file  . Marital Status: Not on file     Family History: The patient's family history includes Other in her father; Stroke in her father.  ROS:   Please see the history of present illness.     All other systems reviewed and are negative.  EKGs/Labs/Other Studies  Reviewed:    The following studies were reviewed today:   EKG:  EKG is ordered today.  The ekg ordered today demonstrates normal sinus rhythm, rate 71, no ST/T abnormalities  Recent Labs: 03/19/2019: TSH 4.930 04/29/2019: ALT 15; BUN 17; Creatinine, Ser 1.27; Hemoglobin 13.6; Platelets 253; Potassium 4.6; Sodium 141  Recent Lipid Panel    Component Value Date/Time   CHOL 231 (H) 03/19/2019 1007   TRIG 280 (H) 03/19/2019 1007   HDL 41 03/19/2019  1007   CHOLHDL 5.6 (H) 03/19/2019 1007   CHOLHDL 5.1 07/23/2010 0244   VLDL 45 (H) 07/23/2010 0244   LDLCALC 139 (H) 03/19/2019 1007   Cath 07/24/10: Diagnostic left heart cardiac catheterization performed on Jul 24, 2010, revealing small-vessel branch disease with 80% stenosis in     the superior branch of the first diagonal, 90% stenosis in the     inferior branch of the first diagonal, 90% stenosis in the second     diagonal with 99% stenosis in a more distal branch of the same     vessel.  There is a 99% stenosis in the third diagonal.  First     obtuse marginal has tandem 99% stenosis while the second obtuse     marginal has 99% stenosis.  Third obtuse marginal with scattered     99% stenosis.  Distal circumflex with 70% stenosis prior to the     third obtuse marginal.  The right coronary artery was widely     patent.  The LAD was also widely patent.  EF was greater than 55%     with normal wall motion.  Anatomy was not felt to be minimal for     PCI or CABG secondary to small-vessel disease.  Physical Exam:    VS:  BP 122/78 (BP Location: Right Arm)   Pulse 71   Temp (!) 97.5 F (36.4 C)   Ht 5' 8"  (1.727 m)   Wt 202 lb (91.6 kg)   LMP 07/05/2010   BMI 30.71 kg/m     Wt Readings from Last 3 Encounters:  05/03/19 202 lb (91.6 kg)  04/29/19 203 lb (92.1 kg)  04/29/19 203 lb 9.6 oz (92.4 kg)     GEN:  in no acute distress HEENT: Normal NECK: No JVD CARDIAC: RRR, no murmurs, rubs, gallops RESPIRATORY:  Clear to  auscultation without rales, wheezing or rhonchi  ABDOMEN: Soft, non-tender, non-distended MUSCULOSKELETAL:  No edema; No deformity  SKIN: Warm and dry NEUROLOGIC:  Alert and oriented x 3 PSYCHIATRIC:  Normal affect   ASSESSMENT:    1. Pre-op evaluation   2. Atherosclerosis of native coronary artery of native heart without angina pectoris   3. Essential hypertension   4. Hyperlipidemia, unspecified hyperlipidemia type    PLAN:    In order of problems listed above:  Pre-op evaluation: prior to left total hip replacement.  Denies any exertional chest pain or dyspnea.  Most recent cath in 2012 with small vessel disease.  Functional capacity greater than 4 METS.  RCRI score 2 (CAD, CVA).  Overall would classify as intermediate risk for an intermediate risk procedure.  Given good functional capacity with no symptoms, no further cardiac work-up recommended prior to procedure.  CAD: cath on 07/13/10 showed small-vessel branch disease with 80% stenosis in the superior branch of the first diagonal, 90% stenosis in the inferior branch of the first diagonal, 90% stenosis in the second diagonal with 99% stenosis in a more distal branch of the same vessel, 99% stenosis in the third diagonal.  First obtuse marginal has tandem 99% stenosis while the second obtuse marginal has 99% stenosis.  Third obtuse marginal with scattered 99% stenosis.  Distal circumflex with 70% stenosis prior to the third obtuse marginal.  The right coronary artery was widely  patent.  The LAD was also widely patent.  EF was greater than 55% with normal wall motion.  Anatomy was not felt to be amenable for PCI  or CABG secondary to small-vessel disease, and medical management recommended. -Continue daily aspirin 81 mg -We will switch statin from pravastatin 40 mg to rosuvastatin 10 mg and titrate as tolerated for goal LDL less than 70  Hypertension: On lisinopril 10 mg daily, Imdur 30 mg daily, metoprolol 25 mg twice daily.  Appears  controlled  Hyperlipidemia: On pravastatin 40 mg daily.  LDL 139 on 03/19/2019 -Switch to rosuvastatin 10 mg daily, will titrate as needed for goal LDL less than 70  Type 2 diabetes: On Januvia, Actos, glipizide, Jardiance.  A1c 6.2   Follow-up with Dr. Domenic Polite in 3 months   Medication Adjustments/Labs and Tests Ordered: Current medicines are reviewed at length with the patient today.  Concerns regarding medicines are outlined above.  Orders Placed This Encounter  Procedures  . EKG 12-Lead   Meds ordered this encounter  Medications  . rosuvastatin (CRESTOR) 10 MG tablet    Sig: Take 1 tablet (10 mg total) by mouth daily.    Dispense:  90 tablet    Refill:  3    Patient Instructions  Medication Instructions:  STOP pravastatin START rosuvastatin (Crestor) 10 mg daily  *If you need a refill on your cardiac medications before your next appointment, please call your pharmacy*   Lab Work: NONE  Testing/Procedures: NONE  Follow-Up: At [facility], you and your health needs are our priority.  As part of our continuing mission to provide you with exceptional heart care, we have created designated Provider Care Teams.  These Care Teams include your primary Cardiologist (physician) and Advanced Practice Providers (APPs -  Physician Assistants and Nurse Practitioners) who all work together to provide you with the care you need, when you need it.  We recommend signing up for the patient portal called "MyChart".  Sign up information is provided on this After Visit Summary.  MyChart is used to connect with patients for Virtual Visits (Telemedicine).  Patients are able to view lab/test results, encounter notes, upcoming appointments, etc.  Non-urgent messages can be sent to your provider as well.   To learn more about what you can do with MyChart, go to [email or web address].    Your next appointment:   3 month(s)  The format for your next appointment:   In Person  Provider:     Rozann Lesches, MD         Signed, Donato Heinz, MD  05/03/2019 9:12 AM    [place]

## 2019-05-03 ENCOUNTER — Other Ambulatory Visit: Payer: Self-pay

## 2019-05-03 ENCOUNTER — Encounter: Payer: Self-pay | Admitting: Cardiology

## 2019-05-03 ENCOUNTER — Ambulatory Visit (INDEPENDENT_AMBULATORY_CARE_PROVIDER_SITE_OTHER): Admitting: Cardiology

## 2019-05-03 VITALS — BP 122/78 | HR 71 | Temp 97.5°F | Ht 68.0 in | Wt 202.0 lb

## 2019-05-03 DIAGNOSIS — Z01818 Encounter for other preprocedural examination: Secondary | ICD-10-CM | POA: Diagnosis not present

## 2019-05-03 DIAGNOSIS — I2510 Atherosclerotic heart disease of native coronary artery without angina pectoris: Secondary | ICD-10-CM

## 2019-05-03 DIAGNOSIS — E785 Hyperlipidemia, unspecified: Secondary | ICD-10-CM | POA: Diagnosis not present

## 2019-05-03 DIAGNOSIS — I10 Essential (primary) hypertension: Secondary | ICD-10-CM | POA: Diagnosis not present

## 2019-05-03 MED ORDER — ROSUVASTATIN CALCIUM 10 MG PO TABS: 10.0000 mg | ORAL_TABLET | Freq: Every day | ORAL | 3 refills | Status: DC

## 2019-05-03 NOTE — Progress Notes (Signed)
Anesthesia Chart Review:  Pt was seen by PCP 04/16/19 and recommended cardiac clearance prior to surgery due to hx of CAD s/p NSTEMI in 2012. Cardiac catheterization at that time showed small-vessel branch disease with 80% stenosis in the superior branch of the first diagonal, 90% stenosis in the inferior branch of the first diagonal, 90% stenosis in the second diagonal with 99% stenosis in a more distal branch of the same vessel, 99% stenosis in the third diagonal. First obtuse marginal has tandem 99% stenosis while the second obtuse marginal has 99% stenosis. Third obtuse marginal with scattered 99% stenosis. Distal circumflex with 70% stenosis prior to the third obtuse marginal. The right coronary artery was widely  patent. The LAD was also widely patent. EF was greater than 55% with normal wall motion. Anatomy was not felt to be amenable for PCI or CABG secondary to small-vessel disease, and medical management recommended.  Pt was seen 05/03/19 by Dr. Gardiner Rhyme for preop cardiac clearance. Per note, "Pre-op evaluation: prior to left total hip replacement.  Denies any exertional chest pain or dyspnea.  Most recent cath in 2012 with small vessel disease.  Functional capacity greater than 4 METS.  RCRI score 2 (CAD, CVA).  Overall would classify as intermediate risk for an intermediate risk procedure.  Given good functional capacity with no symptoms, no further cardiac work-up recommended prior to procedure."  Pt reported having an echo several years ago at [facility]. I called the office and unfortunately this test was not able to be located due to a change in records systems - likely in physical storage and would take some time to retrieve.   Per history, pt had stroke in 2017. Review of neurology notes from that time indicates she had an abnormal MRI of the brain showing evidence of old lacunar stroke in the left caudate and white matter changes in the deep white matter bilaterally, mostly  in the basal ganglia areas. MRI was originally ordered for eval of persistent vertigo - the patient did not recall any symptoms of TIA. Neurology recommended secondary stroke prevention. Carotid US was ordered and per narrative report by Dr. Rexene Alberts, "I reviewed her carotid Doppler report from 10/11/2015. Impression: This study is negative for hemodynamically significant stenosis involving the extracranial carotid arteries bilaterally."  Proep labs reivewed, creatinine mildly elevated at 1.27. Otherwise unremarkable  DMII, last A1c 6.2 on 03/19/19.   EKG 04/29/19: NSR. Rate 68.  Wynonia Musty [facility]/Anesthesiology Phone [phone] 05/04/2019 2:15 PM

## 2019-05-03 NOTE — Patient Instructions (Signed)
Medication Instructions:  STOP pravastatin START rosuvastatin (Crestor) 10 mg daily  *If you need a refill on your cardiac medications before your next appointment, please call your pharmacy*   Lab Work: NONE  Testing/Procedures: NONE  Follow-Up: At [facility], you and your health needs are our priority.  As part of our continuing mission to provide you with exceptional heart care, we have created designated Provider Care Teams.  These Care Teams include your primary Cardiologist (physician) and Advanced Practice Providers (APPs -  Physician Assistants and Nurse Practitioners) who all work together to provide you with the care you need, when you need it.  We recommend signing up for the patient portal called "MyChart".  Sign up information is provided on this After Visit Summary.  MyChart is used to connect with patients for Virtual Visits (Telemedicine).  Patients are able to view lab/test results, encounter notes, upcoming appointments, etc.  Non-urgent messages can be sent to your provider as well.   To learn more about what you can do with MyChart, go to [email or web address].    Your next appointment:   3 month(s)  The format for your next appointment:   In Person  Provider:   Rozann Lesches, MD

## 2019-05-03 NOTE — Progress Notes (Signed)
Cardiac clearance done. thanks

## 2019-05-04 ENCOUNTER — Other Ambulatory Visit (HOSPITAL_COMMUNITY)

## 2019-05-04 ENCOUNTER — Other Ambulatory Visit (HOSPITAL_COMMUNITY)
Admission: RE | Admit: 2019-05-04 | Discharge: 2019-05-04 | Disposition: A | Discharge status: Home / Self Care | Source: Ambulatory Visit | Attending: Orthopaedic Surgery | Admitting: Orthopaedic Surgery

## 2019-05-04 DIAGNOSIS — Z20822 Contact with and (suspected) exposure to covid-19: Secondary | ICD-10-CM | POA: Insufficient documentation

## 2019-05-04 DIAGNOSIS — Z01812 Encounter for preprocedural laboratory examination: Secondary | ICD-10-CM | POA: Diagnosis present

## 2019-05-04 LAB — SARS CORONAVIRUS 2 (TAT 6-24 HRS): SARS Coronavirus 2: NEGATIVE

## 2019-05-04 NOTE — Anesthesia Preprocedure Evaluation (Addendum)
Anesthesia Evaluation  Patient identified by MRN, date of birth, ID band Patient awake    Reviewed: Allergy & Precautions, NPO status , Patient's Chart, lab work & pertinent test results, reviewed documented beta blocker date and time   History of Anesthesia Complications Negative for: history of anesthetic complications  Airway Mallampati: II  TM Distance: >3 FB Neck ROM: Full    Dental  (+) Dental Advisory Given   Pulmonary former smoker (quit 2003),  05/04/2019 SARS coronavirus NEG   breath sounds clear to auscultation       Cardiovascular hypertension, Pt. on medications and Pt. on home beta blockers (-) angina+ CAD (small vessel) and + Past MI (2012)   Rhythm:Regular Rate:Normal  '12 cath: 80% stenosis in the superior branch of the first diagonal, 90% stenosis in the inferior branch of the first diagonal, 90% stenosis in the second diagonal with 99% stenosis in a more distal branch of the same vessel, 99% stenosis in the third diagonal.  First obtuse marginal has tandem 99% stenosis while the second obtuse marginal has 99% stenosis.  Third obtuse marginal with scattered 99% stenosis.  Distal circumflex with 70% stenosis prior to the third obtuse marginal.  The right coronary artery was widely  patent.  The LAD was also widely patent.  EF was greater than 55% with normal wall motion.    Neuro/Psych PSYCHIATRIC DISORDERS Anxiety Depression negative neurological ROS     GI/Hepatic Neg liver ROS, GERD  Controlled and Medicated,  Endo/Other  diabetes (glu 87), Oral Hypoglycemic AgentsMorbid obesity  Renal/GU negative Renal ROS     Musculoskeletal  (+) Arthritis , Osteoarthritis,    Abdominal (+) + obese,   Peds  Hematology negative hematology ROS (+)   Anesthesia Other Findings   Reproductive/Obstetrics negative OB ROS                          Anesthesia Physical Anesthesia Plan  ASA:  III  Anesthesia Plan: Spinal   Post-op Pain Management:    Induction:   PONV Risk Score and Plan: 2 and Ondansetron and Treatment may vary due to age or medical condition  Airway Management Planned: Natural Airway and Simple Face Mask  Additional Equipment:   Intra-op Plan:   Post-operative Plan:   Informed Consent: I have reviewed the patients History and Physical, chart, labs and discussed the procedure including the risks, benefits and alternatives for the proposed anesthesia with the patient or authorized representative who has indicated his/her understanding and acceptance.     Dental advisory given  Plan Discussed with: CRNA and Surgeon  Anesthesia Plan Comments: (Pt was seen by PCP 04/16/19 and recommended cardiac clearance prior to surgery due to hx of CAD s/p NSTEMI in 2012. Cardiac catheterization at that time showedsmall-vessel branch disease with 80% stenosis in the superior branch of the first diagonal, 90% stenosis in the inferior branch of the first diagonal, 90% stenosis in the seconddiagonal with 99% stenosis in a more distal branch of the same vessel,99% stenosis in the third diagonal. First obtuse marginal has tandem 99% stenosis while the second obtuse marginal has 99% stenosis. Third obtuse marginal with scattered99% stenosis. Distal circumflex with 70% stenosis prior to thethird obtuse marginal. The right coronary artery was widely patent. The LAD was also widely patent. EF was greater than 55% with normal wall motion. Anatomy was not felt to beamenablefor PCI or CABG secondary to small-vessel disease, and medicalmanagement recommended.  Pt was seen 05/03/19  by Dr. Gardiner Rhyme for preop cardiac clearance. Per note, "Pre-op evaluation: prior toleft total hip replacement.Denies any exertional chest pain or dyspnea. Most recent cath in 2012 with small vessel disease.Functional capacity greater than 4 METS. RCRI score 2 (CAD, CVA). Overall would  classify as intermediate risk for an intermediate risk procedure. Given good functional capacity with no symptoms, no further cardiac work-up recommended prior to procedure."  Pt reported having an echo several years ago at [facility]. I called the office and unfortunately this test was not able to be located due to a change in records systems - likely in physical storage and would take some time to retrieve.   Per history, pt had stroke in 2017. Review of neurology notes from that time indicates she had an abnormal MRI of the brain showing evidence of oldlacunar stroke in the left caudate and white matter changes in the deep white matter bilaterally, mostly in the basal ganglia areas. MRI was originally ordered for eval of persistent vertigo - the patient did not recall any symptoms of TIA. Neurology recommended secondary stroke prevention. Carotid US was ordered and per narrative report by Dr. Rexene Alberts, "I reviewed her carotid Doppler report from 10/11/2015. Impression: This study is negative for hemodynamically significant stenosis involving the extracranial carotid arteries bilaterally."  Proep labs reivewed, creatinine mildly elevated at 1.27. Otherwise unremarkable  DMII, last A1c 6.2 on 03/19/19.   EKG 04/29/19: NSR. Rate 68.)      Anesthesia Quick Evaluation

## 2019-05-07 ENCOUNTER — Encounter (HOSPITAL_COMMUNITY): Payer: Self-pay | Admitting: Orthopaedic Surgery

## 2019-05-07 ENCOUNTER — Encounter (HOSPITAL_COMMUNITY)
Admission: RE | Disposition: A | Discharge status: Home Health | Payer: Self-pay | Source: Home / Self Care | Attending: Orthopaedic Surgery

## 2019-05-07 ENCOUNTER — Ambulatory Visit (HOSPITAL_COMMUNITY): Admission: RE | Admit: 2019-05-07 | Source: Home / Self Care | Admitting: Orthopaedic Surgery

## 2019-05-07 ENCOUNTER — Inpatient Hospital Stay (HOSPITAL_COMMUNITY)
Admission: RE | Admit: 2019-05-07 | Discharge: 2019-05-09 | DRG: 470 | Disposition: A | Discharge status: Home Health | Attending: Orthopaedic Surgery | Admitting: Orthopaedic Surgery

## 2019-05-07 ENCOUNTER — Encounter (HOSPITAL_COMMUNITY): Admission: RE | Payer: Self-pay | Source: Home / Self Care

## 2019-05-07 ENCOUNTER — Other Ambulatory Visit: Payer: Self-pay

## 2019-05-07 ENCOUNTER — Ambulatory Visit (HOSPITAL_COMMUNITY)

## 2019-05-07 ENCOUNTER — Ambulatory Visit (HOSPITAL_COMMUNITY): Admitting: Physician Assistant

## 2019-05-07 DIAGNOSIS — Z79899 Other long term (current) drug therapy: Secondary | ICD-10-CM

## 2019-05-07 DIAGNOSIS — E861 Hypovolemia: Secondary | ICD-10-CM | POA: Diagnosis not present

## 2019-05-07 DIAGNOSIS — E1159 Type 2 diabetes mellitus with other circulatory complications: Secondary | ICD-10-CM

## 2019-05-07 DIAGNOSIS — Z6835 Body mass index (BMI) 35.0-35.9, adult: Secondary | ICD-10-CM

## 2019-05-07 DIAGNOSIS — M1612 Unilateral primary osteoarthritis, left hip: Principal | ICD-10-CM

## 2019-05-07 DIAGNOSIS — I252 Old myocardial infarction: Secondary | ICD-10-CM

## 2019-05-07 DIAGNOSIS — R0602 Shortness of breath: Secondary | ICD-10-CM

## 2019-05-07 DIAGNOSIS — E860 Dehydration: Secondary | ICD-10-CM | POA: Diagnosis not present

## 2019-05-07 DIAGNOSIS — E782 Mixed hyperlipidemia: Secondary | ICD-10-CM | POA: Diagnosis present

## 2019-05-07 DIAGNOSIS — F329 Major depressive disorder, single episode, unspecified: Secondary | ICD-10-CM | POA: Diagnosis present

## 2019-05-07 DIAGNOSIS — K219 Gastro-esophageal reflux disease without esophagitis: Secondary | ICD-10-CM | POA: Diagnosis present

## 2019-05-07 DIAGNOSIS — D62 Acute posthemorrhagic anemia: Secondary | ICD-10-CM | POA: Diagnosis present

## 2019-05-07 DIAGNOSIS — Z96642 Presence of left artificial hip joint: Secondary | ICD-10-CM

## 2019-05-07 DIAGNOSIS — I999 Unspecified disorder of circulatory system: Secondary | ICD-10-CM | POA: Diagnosis present

## 2019-05-07 DIAGNOSIS — M25542 Pain in joints of left hand: Secondary | ICD-10-CM | POA: Diagnosis present

## 2019-05-07 DIAGNOSIS — Z87891 Personal history of nicotine dependence: Secondary | ICD-10-CM

## 2019-05-07 DIAGNOSIS — Z8673 Personal history of transient ischemic attack (TIA), and cerebral infarction without residual deficits: Secondary | ICD-10-CM

## 2019-05-07 DIAGNOSIS — E1151 Type 2 diabetes mellitus with diabetic peripheral angiopathy without gangrene: Secondary | ICD-10-CM | POA: Diagnosis present

## 2019-05-07 DIAGNOSIS — I959 Hypotension, unspecified: Secondary | ICD-10-CM

## 2019-05-07 DIAGNOSIS — M25541 Pain in joints of right hand: Secondary | ICD-10-CM | POA: Diagnosis present

## 2019-05-07 DIAGNOSIS — Z419 Encounter for procedure for purposes other than remedying health state, unspecified: Secondary | ICD-10-CM

## 2019-05-07 DIAGNOSIS — E669 Obesity, unspecified: Secondary | ICD-10-CM | POA: Diagnosis present

## 2019-05-07 DIAGNOSIS — M19049 Primary osteoarthritis, unspecified hand: Secondary | ICD-10-CM | POA: Diagnosis present

## 2019-05-07 DIAGNOSIS — F419 Anxiety disorder, unspecified: Secondary | ICD-10-CM | POA: Diagnosis present

## 2019-05-07 DIAGNOSIS — Z20822 Contact with and (suspected) exposure to covid-19: Secondary | ICD-10-CM | POA: Diagnosis present

## 2019-05-07 DIAGNOSIS — Z823 Family history of stroke: Secondary | ICD-10-CM

## 2019-05-07 DIAGNOSIS — I10 Essential (primary) hypertension: Secondary | ICD-10-CM | POA: Diagnosis present

## 2019-05-07 DIAGNOSIS — I2510 Atherosclerotic heart disease of native coronary artery without angina pectoris: Secondary | ICD-10-CM | POA: Diagnosis present

## 2019-05-07 DIAGNOSIS — Z7984 Long term (current) use of oral hypoglycemic drugs: Secondary | ICD-10-CM

## 2019-05-07 HISTORY — PX: TOTAL HIP ARTHROPLASTY: SHX124

## 2019-05-07 LAB — GLUCOSE, CAPILLARY
Glucose-Capillary: 76 mg/dL (ref 70–99)
Glucose-Capillary: 87 mg/dL (ref 70–99)
Glucose-Capillary: 86 mg/dL (ref 70–99)
Glucose-Capillary: 92 mg/dL (ref 70–99)
Glucose-Capillary: 161 mg/dL — ABNORMAL HIGH (ref 70–99)

## 2019-05-07 SURGERY — ARTHROPLASTY, HIP, TOTAL, ANTERIOR APPROACH
Anesthesia: Spinal | Site: Hip | Laterality: Left

## 2019-05-07 MED ORDER — GLIPIZIDE 5 MG PO TABS: 10.0000 mg | ORAL_TABLET | Freq: Every day | ORAL | Status: DC

## 2019-05-07 MED ORDER — METHOCARBAMOL 1000 MG/10ML IJ SOLN
500.0000 mg | Freq: Four times a day (QID) | INTRAVENOUS | Status: DC | PRN
  Filled 2019-05-07: qty 5

## 2019-05-07 MED ORDER — PHENYLEPHRINE HCL-NACL 10-0.9 MG/250ML-% IV SOLN
INTRAVENOUS | Status: DC | PRN
  Administered 2019-05-07: 20 ug/min via INTRAVENOUS

## 2019-05-07 MED ORDER — PROPOFOL 500 MG/50ML IV EMUL
INTRAVENOUS | Status: DC | PRN
  Administered 2019-05-07: 25 ug/kg/min via INTRAVENOUS

## 2019-05-07 MED ORDER — PHENOL 1.4 % MT LIQD: 1.0000 | OROMUCOSAL | Status: DC | PRN

## 2019-05-07 MED ORDER — BUPIVACAINE LIPOSOME 1.3 % IJ SUSP
INTRAMUSCULAR | Status: DC | PRN
  Administered 2019-05-07: 20 mL

## 2019-05-07 MED ORDER — SODIUM CHLORIDE 0.9 % IV SOLN: Freq: Once | INTRAVENOUS | Status: AC

## 2019-05-07 MED ORDER — MECLIZINE HCL 25 MG PO TABS
25.0000 mg | ORAL_TABLET | Freq: Three times a day (TID) | ORAL | Status: DC | PRN
  Filled 2019-05-07: qty 1

## 2019-05-07 MED ORDER — FENTANYL CITRATE (PF) 250 MCG/5ML IJ SOLN
INTRAMUSCULAR | Status: DC | PRN
  Administered 2019-05-07: 25 ug via INTRAVENOUS
  Administered 2019-05-07: 50 ug via INTRAVENOUS

## 2019-05-07 MED ORDER — ACETAMINOPHEN 500 MG PO TABS
1000.0000 mg | ORAL_TABLET | Freq: Four times a day (QID) | ORAL | Status: AC
  Administered 2019-05-07 – 2019-05-08 (×4): 1000 mg via ORAL
  Filled 2019-05-07 (×4): qty 2

## 2019-05-07 MED ORDER — CEFAZOLIN SODIUM-DEXTROSE 2-4 GM/100ML-% IV SOLN
2.0000 g | INTRAVENOUS | Status: AC
  Administered 2019-05-07: 2 g via INTRAVENOUS

## 2019-05-07 MED ORDER — ONDANSETRON HCL 4 MG PO TABS: 4.0000 mg | ORAL_TABLET | Freq: Four times a day (QID) | ORAL | Status: DC | PRN

## 2019-05-07 MED ORDER — HYDROMORPHONE HCL 1 MG/ML IJ SOLN: 0.5000 mg | INTRAMUSCULAR | Status: DC | PRN

## 2019-05-07 MED ORDER — ISOSORBIDE MONONITRATE ER 30 MG PO TB24
30.0000 mg | ORAL_TABLET | Freq: Two times a day (BID) | ORAL | Status: DC
  Administered 2019-05-07: 30 mg via ORAL
  Filled 2019-05-07 (×2): qty 1

## 2019-05-07 MED ORDER — LINAGLIPTIN 5 MG PO TABS
5.0000 mg | ORAL_TABLET | Freq: Every day | ORAL | Status: DC
  Administered 2019-05-07: 5 mg via ORAL
  Filled 2019-05-07: qty 1

## 2019-05-07 MED ORDER — MEPERIDINE HCL 25 MG/ML IJ SOLN: 6.2500 mg | INTRAMUSCULAR | Status: DC | PRN

## 2019-05-07 MED ORDER — LACTATED RINGERS IV SOLN: INTRAVENOUS | Status: DC

## 2019-05-07 MED ORDER — PIOGLITAZONE HCL 30 MG PO TABS: 30.0000 mg | ORAL_TABLET | Freq: Every day | ORAL | Status: DC

## 2019-05-07 MED ORDER — ACETAMINOPHEN 500 MG PO TABS
ORAL_TABLET | ORAL | Status: AC
  Filled 2019-05-07: qty 2

## 2019-05-07 MED ORDER — ROSUVASTATIN CALCIUM 5 MG PO TABS
10.0000 mg | ORAL_TABLET | Freq: Every day | ORAL | Status: DC
  Administered 2019-05-07 – 2019-05-09 (×3): 10 mg via ORAL
  Filled 2019-05-07 (×3): qty 2

## 2019-05-07 MED ORDER — BUPIVACAINE LIPOSOME 1.3 % IJ SUSP
20.0000 mL | Freq: Once | INTRAMUSCULAR | Status: DC
  Filled 2019-05-07: qty 20

## 2019-05-07 MED ORDER — HYDROMORPHONE HCL 1 MG/ML IJ SOLN
0.2500 mg | INTRAMUSCULAR | Status: DC | PRN
  Administered 2019-05-07: 0.5 mg via INTRAVENOUS

## 2019-05-07 MED ORDER — HYDROMORPHONE HCL 1 MG/ML IJ SOLN
INTRAMUSCULAR | Status: AC
  Filled 2019-05-07: qty 1

## 2019-05-07 MED ORDER — DOCUSATE SODIUM 100 MG PO CAPS
100.0000 mg | ORAL_CAPSULE | Freq: Two times a day (BID) | ORAL | Status: DC
  Administered 2019-05-08 – 2019-05-09 (×3): 100 mg via ORAL
  Filled 2019-05-07 (×4): qty 1

## 2019-05-07 MED ORDER — CANAGLIFLOZIN 100 MG PO TABS
100.0000 mg | ORAL_TABLET | Freq: Every day | ORAL | Status: DC
  Filled 2019-05-07: qty 1

## 2019-05-07 MED ORDER — LISINOPRIL 10 MG PO TABS: 10.0000 mg | ORAL_TABLET | Freq: Every day | ORAL | Status: DC

## 2019-05-07 MED ORDER — ACETAMINOPHEN 500 MG PO TABS
1000.0000 mg | ORAL_TABLET | Freq: Once | ORAL | Status: AC
  Administered 2019-05-07: 1000 mg via ORAL

## 2019-05-07 MED ORDER — PANTOPRAZOLE SODIUM 40 MG PO TBEC
40.0000 mg | DELAYED_RELEASE_TABLET | Freq: Every day | ORAL | Status: DC
  Administered 2019-05-07 – 2019-05-09 (×3): 40 mg via ORAL
  Filled 2019-05-07 (×3): qty 1

## 2019-05-07 MED ORDER — DIPHENHYDRAMINE HCL 12.5 MG/5ML PO ELIX: 12.5000 mg | ORAL_SOLUTION | ORAL | Status: DC | PRN

## 2019-05-07 MED ORDER — CEFAZOLIN SODIUM-DEXTROSE 2-4 GM/100ML-% IV SOLN
INTRAVENOUS | Status: AC
  Filled 2019-05-07: qty 100

## 2019-05-07 MED ORDER — SODIUM CHLORIDE 0.9 % IV BOLUS
300.0000 mL | Freq: Once | INTRAVENOUS | Status: AC
  Administered 2019-05-07: 300 mL via INTRAVENOUS

## 2019-05-07 MED ORDER — ALPRAZOLAM 0.5 MG PO TABS: 1.0000 mg | ORAL_TABLET | Freq: Two times a day (BID) | ORAL | Status: DC | PRN

## 2019-05-07 MED ORDER — FERROUS SULFATE 325 (65 FE) MG PO TABS
325.0000 mg | ORAL_TABLET | Freq: Three times a day (TID) | ORAL | Status: DC
  Administered 2019-05-07 – 2019-05-09 (×6): 325 mg via ORAL
  Filled 2019-05-07 (×6): qty 1

## 2019-05-07 MED ORDER — MIDAZOLAM HCL 2 MG/2ML IJ SOLN: 0.5000 mg | Freq: Once | INTRAMUSCULAR | Status: DC | PRN

## 2019-05-07 MED ORDER — ACETAMINOPHEN 325 MG PO TABS: 325.0000 mg | ORAL_TABLET | Freq: Four times a day (QID) | ORAL | Status: DC | PRN

## 2019-05-07 MED ORDER — CHLORHEXIDINE GLUCONATE 4 % EX LIQD: 60.0000 mL | Freq: Once | CUTANEOUS | Status: DC

## 2019-05-07 MED ORDER — ONDANSETRON HCL 4 MG/2ML IJ SOLN
4.0000 mg | Freq: Four times a day (QID) | INTRAMUSCULAR | Status: DC | PRN
  Administered 2019-05-07: 4 mg via INTRAVENOUS
  Filled 2019-05-07: qty 2

## 2019-05-07 MED ORDER — ALBUMIN HUMAN 5 % IV SOLN
12.5000 g | Freq: Once | INTRAVENOUS | Status: AC
  Administered 2019-05-07: 12.5 g via INTRAVENOUS

## 2019-05-07 MED ORDER — OXYCODONE HCL 5 MG PO TABS
5.0000 mg | ORAL_TABLET | ORAL | Status: DC | PRN
  Administered 2019-05-08: 22:00:00 10 mg via ORAL
  Administered 2019-05-09: 5 mg via ORAL
  Filled 2019-05-07: qty 1
  Filled 2019-05-07 (×3): qty 2

## 2019-05-07 MED ORDER — MENTHOL 3 MG MT LOZG: 1.0000 | LOZENGE | OROMUCOSAL | Status: DC | PRN

## 2019-05-07 MED ORDER — NITROGLYCERIN 0.4 MG SL SUBL: 0.4000 mg | SUBLINGUAL_TABLET | SUBLINGUAL | Status: DC | PRN

## 2019-05-07 MED ORDER — METOPROLOL TARTRATE 25 MG PO TABS
25.0000 mg | ORAL_TABLET | Freq: Two times a day (BID) | ORAL | Status: DC
  Filled 2019-05-07: qty 1

## 2019-05-07 MED ORDER — ACETAMINOPHEN 500 MG PO TABS: 1000.0000 mg | ORAL_TABLET | Freq: Once | ORAL | Status: DC

## 2019-05-07 MED ORDER — GABAPENTIN 300 MG PO CAPS
300.0000 mg | ORAL_CAPSULE | Freq: Three times a day (TID) | ORAL | Status: DC
  Administered 2019-05-07 – 2019-05-09 (×6): 300 mg via ORAL
  Filled 2019-05-07 (×6): qty 1

## 2019-05-07 MED ORDER — METOCLOPRAMIDE HCL 5 MG/ML IJ SOLN: 5.0000 mg | Freq: Three times a day (TID) | INTRAMUSCULAR | Status: DC | PRN

## 2019-05-07 MED ORDER — 0.9 % SODIUM CHLORIDE (POUR BTL) OPTIME
TOPICAL | Status: DC | PRN
  Administered 2019-05-07: 1000 mL

## 2019-05-07 MED ORDER — BUPIVACAINE IN DEXTROSE 0.75-8.25 % IT SOLN
INTRATHECAL | Status: DC | PRN
  Administered 2019-05-07: 12 mg via INTRATHECAL

## 2019-05-07 MED ORDER — METHOCARBAMOL 500 MG PO TABS
500.0000 mg | ORAL_TABLET | Freq: Four times a day (QID) | ORAL | Status: DC | PRN
  Administered 2019-05-07 – 2019-05-08 (×2): 500 mg via ORAL
  Filled 2019-05-07 (×2): qty 1

## 2019-05-07 MED ORDER — OXYCODONE HCL 5 MG PO TABS
10.0000 mg | ORAL_TABLET | ORAL | Status: DC | PRN
  Administered 2019-05-07 (×2): 10 mg via ORAL

## 2019-05-07 MED ORDER — PAROXETINE HCL 20 MG PO TABS
20.0000 mg | ORAL_TABLET | ORAL | Status: DC
  Administered 2019-05-07 – 2019-05-09 (×3): 20 mg via ORAL
  Filled 2019-05-07 (×3): qty 1

## 2019-05-07 MED ORDER — ASPIRIN EC 325 MG PO TBEC
325.0000 mg | DELAYED_RELEASE_TABLET | Freq: Every day | ORAL | Status: DC
  Administered 2019-05-08 – 2019-05-09 (×2): 325 mg via ORAL
  Filled 2019-05-07 (×2): qty 1

## 2019-05-07 MED ORDER — METOCLOPRAMIDE HCL 5 MG PO TABS: 5.0000 mg | ORAL_TABLET | Freq: Three times a day (TID) | ORAL | Status: DC | PRN

## 2019-05-07 MED ORDER — SODIUM CHLORIDE 0.45 % IV SOLN: INTRAVENOUS | Status: DC

## 2019-05-07 MED ORDER — LACTATED RINGERS IV SOLN: INTRAVENOUS | Status: DC | PRN

## 2019-05-07 MED ORDER — BUPIVACAINE-EPINEPHRINE 0.25% -1:200000 IJ SOLN
INTRAMUSCULAR | Status: DC | PRN
  Administered 2019-05-07: 20 mL

## 2019-05-07 MED ORDER — ALBUMIN HUMAN 5 % IV SOLN
INTRAVENOUS | Status: AC
  Filled 2019-05-07: qty 250

## 2019-05-07 MED ORDER — EPHEDRINE SULFATE 50 MG/ML IJ SOLN
INTRAMUSCULAR | Status: DC | PRN
  Administered 2019-05-07: 5 mg via INTRAVENOUS

## 2019-05-07 MED ORDER — OMEPRAZOLE MAGNESIUM 20 MG PO TBEC: 20.0000 mg | DELAYED_RELEASE_TABLET | Freq: Every day | ORAL | Status: DC

## 2019-05-07 MED ORDER — DIPHENHYDRAMINE HCL 50 MG/ML IJ SOLN
INTRAMUSCULAR | Status: AC
  Administered 2019-05-07: 12.5 mg
  Filled 2019-05-07: qty 1

## 2019-05-07 MED ORDER — MIDAZOLAM HCL 2 MG/2ML IJ SOLN
INTRAMUSCULAR | Status: DC | PRN
  Administered 2019-05-07: 2 mg via INTRAVENOUS

## 2019-05-07 MED ORDER — PROMETHAZINE HCL 25 MG/ML IJ SOLN: 6.2500 mg | INTRAMUSCULAR | Status: DC | PRN

## 2019-05-07 SURGICAL SUPPLY — 52 items
APL SKNCLS STERI-STRIP NONHPOA (GAUZE/BANDAGES/DRESSINGS) ×1
BENZOIN TINCTURE PRP APPL 2/3 (GAUZE/BANDAGES/DRESSINGS) ×2 IMPLANT
BLADE SAW SGTL 18X1.27X75 (BLADE) ×2 IMPLANT
CELLS DAT CNTRL 66122 CELL SVR (MISCELLANEOUS) ×1 IMPLANT
COVER SURGICAL LIGHT HANDLE (MISCELLANEOUS) ×2 IMPLANT
COVER WAND RF STERILE (DRAPES) ×2 IMPLANT
CUP ACETBLR 52 OD 100 SERIES (Hips) ×1 IMPLANT
DRAPE C-ARM 42X72 X-RAY (DRAPES) ×2 IMPLANT
DRAPE IMP U-DRAPE 54X76 (DRAPES) ×2 IMPLANT
DRAPE STERI IOBAN 125X83 (DRAPES) ×2 IMPLANT
DRAPE U-SHAPE 47X51 STRL (DRAPES) ×6 IMPLANT
DRSG AQUACEL AG ADV 3.5X10 (GAUZE/BANDAGES/DRESSINGS) ×1 IMPLANT
DRSG MEPILEX BORDER 4X8 (GAUZE/BANDAGES/DRESSINGS) ×2 IMPLANT
DURAPREP 26ML APPLICATOR (WOUND CARE) ×2 IMPLANT
ELECT BLADE 4.0 EZ CLEAN MEGAD (MISCELLANEOUS)
ELECT CAUTERY BLADE 6.4 (BLADE) ×2 IMPLANT
ELECT REM PT RETURN 9FT ADLT (ELECTROSURGICAL) ×2
ELECTRODE BLDE 4.0 EZ CLN MEGD (MISCELLANEOUS) IMPLANT
ELECTRODE REM PT RTRN 9FT ADLT (ELECTROSURGICAL) ×1 IMPLANT
ELIMINATOR HOLE APEX DEPUY (Hips) ×2 IMPLANT
FACESHIELD WRAPAROUND (MASK) ×4 IMPLANT
GLOVE BIOGEL PI IND STRL 8 (GLOVE) ×2 IMPLANT
GLOVE BIOGEL PI INDICATOR 8 (GLOVE) ×2
GLOVE ORTHO TXT STRL SZ7.5 (GLOVE) ×4 IMPLANT
GOWN STRL REUS W/ TWL LRG LVL3 (GOWN DISPOSABLE) ×1 IMPLANT
GOWN STRL REUS W/ TWL XL LVL3 (GOWN DISPOSABLE) ×1 IMPLANT
GOWN STRL REUS W/TWL 2XL LVL3 (GOWN DISPOSABLE) ×2 IMPLANT
GOWN STRL REUS W/TWL LRG LVL3 (GOWN DISPOSABLE) ×2
GOWN STRL REUS W/TWL XL LVL3 (GOWN DISPOSABLE) ×2
HEAD CERAMIC 36 PLUS5 (Hips) ×1 IMPLANT
KIT BASIN OR (CUSTOM PROCEDURE TRAY) ×2 IMPLANT
KIT TURNOVER KIT B (KITS) ×2 IMPLANT
LINER NEUTRAL 52X36MM PLUS 4 (Liner) ×1 IMPLANT
MANIFOLD NEPTUNE II (INSTRUMENTS) ×2 IMPLANT
NS IRRIG 1000ML POUR BTL (IV SOLUTION) ×2 IMPLANT
PACK TOTAL JOINT (CUSTOM PROCEDURE TRAY) ×2 IMPLANT
PAD ARMBOARD 7.5X6 YLW CONV (MISCELLANEOUS) ×4 IMPLANT
RETRACTOR WND ALEXIS 18 MED (MISCELLANEOUS) ×1 IMPLANT
RTRCTR WOUND ALEXIS 18CM MED (MISCELLANEOUS) ×2
STEM FEM SZ3 STD ACTIS (Stem) ×1 IMPLANT
STRIP CLOSURE SKIN 1/2X4 (GAUZE/BANDAGES/DRESSINGS) ×2 IMPLANT
SUT VIC AB 0 CT1 27 (SUTURE) ×2
SUT VIC AB 0 CT1 27XBRD ANBCTR (SUTURE) ×1 IMPLANT
SUT VIC AB 2-0 CT1 27 (SUTURE) ×2
SUT VIC AB 2-0 CT1 TAPERPNT 27 (SUTURE) ×1 IMPLANT
SUT VICRYL 4-0 PS2 18IN ABS (SUTURE) ×2 IMPLANT
SUT VLOC 180 0 24IN GS25 (SUTURE) ×2 IMPLANT
TOWEL GREEN STERILE (TOWEL DISPOSABLE) ×4 IMPLANT
TOWEL GREEN STERILE FF (TOWEL DISPOSABLE) ×2 IMPLANT
TRAY CATH 16FR W/PLASTIC CATH (SET/KITS/TRAYS/PACK) IMPLANT
TRAY FOLEY MTR SLVR 16FR STAT (SET/KITS/TRAYS/PACK) IMPLANT
WATER STERILE IRR 1000ML POUR (IV SOLUTION) ×4 IMPLANT

## 2019-05-07 NOTE — H&P (Signed)
TOTAL HIP ADMISSION H&P  Patient is admitted for left total hip arthroplasty.  Subjective:  Chief Complaint: left hip pain  HPI: Natalie Tanner, 59 y.o. female, has a history of pain and functional disability in the left hip(s) due to arthritis and patient has failed non-surgical conservative treatments for greater than 12 weeks to include NSAID's and/or analgesics, supervised PT with diminished ADL's post treatment, use of assistive devices, weight reduction as appropriate and activity modification.  Onset of symptoms was gradual starting 10 years ago with gradually worsening course since that time.The patient noted no past surgery on the left hip(s).  Patient currently rates pain in the left hip at 10 out of 10 with activity. Patient has worsening of pain with activity and weight bearing, trendelenberg gait, pain that interfers with activities of daily living, pain with passive range of motion and crepitus. Patient has evidence of subchondral cysts, subchondral sclerosis, periarticular osteophytes and joint space narrowing by imaging studies. This condition presents safety issues increasing the risk of falls. There is no current active infection.  Patient Active Problem List   Diagnosis Date Noted  . Unilateral primary osteoarthritis, left hip 03/11/2019  . Plantar fasciitis, right 09/16/2018  . Achilles tendon disorder, right 09/16/2018  . Concussion with loss of consciousness 11/17/2017  . Other complicated headache syndrome 11/17/2017  . Osteoarthritis of finger 03/14/2017  . Trigger finger of left thumb 11/27/2016  . Calcification of tendon 11/27/2016  . Arthralgia of both hands 08/26/2016  . Labyrinthitis of left ear 02/21/2016  . Inner ear disease, left 02/21/2016  . Bilateral hearing loss 06/26/2015  . 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 Coronary atherosclerosis of native coronary artery 08/13/2010  . Diabetes ([place]) 08/13/2010  . Mixed hyperlipidemia 08/13/2010  . Essential  hypertension, benign 08/13/2010  . Iron deficiency anemia 08/13/2010   Past Medical History:  Diagnosis Date  . Anginal pain ([place])   . Anxiety   . Coronary atherosclerosis of native coronary artery    Managed medically following cardiac catheterization 5/12  . Depression   . Essential hypertension, benign   . GERD (gastroesophageal reflux disease)   . History of kidney stones   . Iron deficiency anemia    Secondary to menometrorrhagia  . Mixed hyperlipidemia   . Myocardial infarction [facility])    NSTEMI 5/12  . Nephrolithiasis   . Stroke ([place]) 2017  . Type 2 diabetes mellitus ([place])     Past Surgical History:  Procedure Laterality Date  . BACK SURGERY    . KNEE SURGERY    . SINUS SURGERY WITH INSTATRAK      No current facility-administered medications for this encounter.   Current Outpatient Medications  Medication Sig Dispense Refill Last Dose  . ALPRAZolam (XANAX) 1 MG tablet Take 1 tablet (1 mg total) by mouth 2 (two) times daily as needed. 180 tablet 1   . blood glucose meter kit and supplies KIT Dispense based on patient and insurance preference. Use up to four times daily as directed. For diabetes E11.9, with hyperglycemia. 90 each 11   . empagliflozin (JARDIANCE) 25 MG TABS tablet Take 25 mg by mouth daily. 90 tablet 3   . gabapentin (NEURONTIN) 300 MG capsule Take 1 capsule (300 mg total) by mouth 3 (three) times daily. 90 capsule 3   . glipiZIDE (GLUCOTROL) 10 MG tablet Take 1 tablet (10 mg total) by mouth daily. 90 tablet 3   . isosorbide mononitrate (IMDUR) 30 MG 24 hr tablet Take 1 tablet (30 mg total)  by mouth 2 (two) times daily. 180 tablet 3   . lisinopril (ZESTRIL) 10 MG tablet Take 1 tablet (10 mg total) by mouth daily. 90 tablet 0   . meclizine (ANTIVERT) 25 MG tablet Take 1 tablet (25 mg total) by mouth 3 (three) times daily as needed for dizziness. 90 tablet 11   . metoprolol tartrate (LOPRESSOR) 25 MG tablet Take 1 tablet (25 mg total) by mouth 2 (two) times  daily. 180 tablet 11   . nitroGLYCERIN (NITROSTAT) 0.4 MG SL tablet Place 1 tablet (0.4 mg total) under the tongue every 5 (five) minutes as needed for chest pain. 25 tablet 2   . omeprazole (PRILOSEC OTC) 20 MG tablet Take 1 tablet (20 mg total) by mouth daily. 90 tablet 3   . PARoxetine (PAXIL) 20 MG tablet Take 1 tablet (20 mg total) by mouth every morning. 90 tablet 3   . pioglitazone (ACTOS) 30 MG tablet Take 1 tablet (30 mg total) by mouth daily. 90 tablet 1   . rosuvastatin (CRESTOR) 10 MG tablet Take 1 tablet (10 mg total) by mouth daily. 90 tablet 3   . sitaGLIPtin (JANUVIA) 100 MG tablet Take 1 tablet (100 mg total) by mouth daily. 90 tablet 1    No Known Allergies  Social History   Tobacco Use  . Smoking status: Former Smoker    Packs/day: 1.00    Years: 20.00    Pack years: 20.00    Types: Cigarettes    Quit date: 03/04/2001    Years since quitting: 18.1  . Smokeless tobacco: Never Used  Substance Use Topics  . Alcohol use: No    Family History  Problem Relation Age of Onset  . Other Father        Clostridium difficile  . Stroke Father      Review of Systems  Constitutional: Positive for activity change.  HENT: Negative.   Respiratory: Negative.   Cardiovascular: Negative.   Genitourinary: Negative.   Musculoskeletal: Positive for gait problem.  Psychiatric/Behavioral: Negative.     Objective:  Physical Exam  Constitutional: She is oriented to person, place, and time. She appears well-developed. No distress.  HENT:  Head: Normocephalic.  Eyes: Pupils are equal, round, and reactive to light. EOM are normal.  Cardiovascular: Normal heart sounds.  Respiratory: Effort normal. No respiratory distress.  GI: She exhibits no distension. There is no abdominal tenderness.  Musculoskeletal:        General: Tenderness present.     Cervical back: Normal range of motion.  Neurological: She is alert and oriented to person, place, and time.  Skin: Skin is warm and dry.   Psychiatric: She has a normal mood and affect.    Vital signs in last 24 hours:    Labs:   Estimated body mass index is 30.71 kg/m as calculated from the following:   Height as of 05/03/19: 5' 8"  (1.727 m).   Weight as of 05/03/19: 91.6 kg.   Imaging Review Plain radiographs demonstrate moderate degenerative joint disease of the left hip(s). The bone quality appears to be good for age and reported activity level.      Assessment/Plan:  End stage arthritis, left hip(s)  The patient history, physical examination, clinical judgement of the provider and imaging studies are consistent with end stage degenerative joint disease of the left hip(s) and total hip arthroplasty is deemed medically necessary. The treatment options including medical management, injection therapy, arthroscopy and arthroplasty were discussed at length. The risks and  benefits of total hip arthroplasty were presented and reviewed. The risks due to aseptic loosening, infection, stiffness, dislocation/subluxation,  thromboembolic complications and other imponderables were discussed.  The patient acknowledged the explanation, agreed to proceed with the plan and consent was signed. Patient is being admitted for inpatient treatment for surgery, pain control, PT, OT, prophylactic antibiotics, VTE prophylaxis, progressive ambulation and ADL's and discharge planning.The patient is planning to be discharged home with home health services

## 2019-05-07 NOTE — Anesthesia Procedure Notes (Signed)
Spinal  Patient location during procedure: OR End time: 05/07/2019 10:41 AM Staffing Performed: anesthesiologist  Anesthesiologist: Annye Asa, MD Preanesthetic Checklist Completed: patient identified, IV checked, site marked, risks and benefits discussed, surgical consent, monitors and equipment checked, pre-op evaluation and timeout performed Spinal Block Patient position: sitting Prep: DuraPrep and site prepped and draped Patient monitoring: blood pressure, continuous pulse ox, cardiac monitor and heart rate Approach: midline Location: L2-3 Injection technique: single-shot Needle Needle type: Pencan  Needle gauge: 24 G Needle length: 9 cm Additional Notes Pt identified in Operating room.  Monitors applied. Working IV access confirmed. Sterile prep, drape lumbar spine.  1% lido local L 2,3.  #24ga Pencan into clear CSF L 2,3.  12mg  0.75% Bupivacaine with dextrose injected with asp CSF beginning and end of injection.  Patient asymptomatic, VSS, no heme aspirated, tolerated well.  Jenita Seashore, MD

## 2019-05-07 NOTE — Op Note (Signed)
Preop diagnosis: Left hip primary osteoarthritis  Postop diagnosis same  Procedure: Left total hip arthroplasty, direct anterior approach  Surgeon: Rodell Perna, MD  Assistant: April Fulp, RNFA  EBL: 200 cc  Drains: None  Anesthesia: Spinal +20 cc Marcaine and 20 cc Exparel local at the end of case.  Implants:Depuy Pinnacle 52 mm Gripton cup.  52 x 36 mm +4 neutral liner.  Size 3 Actis stem and +5 length 36 mm diameter ceramic ball.  Procedure: After induction of spinal anesthesia placement on the Hana table C-arm was brought in.  1015 drapes were applied DuraPrep was performed.  Left side was short several millimeters due to arthritis and cartilage wear.  After prepping timeout procedure large sharp curtain Betadine Steri-Drape was applied and usual sheets and drapes.  Preoperative antibiotics Ancef was given.  Direct anterior approach was made from the ASIS to the trochanter obliquely starting 2 cm inferior to lateral to the trochanter.  Patient had thick layer of adipose tissue which was split.  Abdomen had been taped over to aid in exposure.  Fascia was identified nicked extended and a dull cobra was placed over the top the capsule.  Transverse arteries were coagulated along with accompanying veins.  Capsule was opened clear fluid was noted no culture was needed anterior capsule was resected and then the Cobra was placed inside the capsule.  Another cobra over the neck and C arm was cut under C-arm visualization completing the resection with an osteotome next to the trochanter.  Head was removed with a corkscrew.  Sequential reaming up to 51 mm for placment of a 52 cup was placed initially did not want to fully seat, repeat reaming with a 51 mm was performed in the 52 cup fit securely tightly and was fully impacted under C arm set completely down was stable and the apex hole illuminator was placed in the single central hole.  Hydraulic arm was applied leg was taken down and under after external  rotation 120.  It took a while to get entry to the canal due to the thick adipose layer and some tightness but finally the hip was able to be delivered anteriorly with the Mueller medially and the large trochanteric clamp behind the trochanter.  Progression down the canal was performed fully filling the canal until the size 3 stem was selected which fit tight medial lateral was good position AP and lateral fluoroscopic images and +5 neck length gave restoration of her length good stability no subluxation with hip at 45 degrees extension and externally rotated 90 degrees and only trace shuck.  Permanent stem was inserted +5 ceramic ball hip was reduced identical findings of stability.  Copious irrigation repeat cauterization of bleeders.  Exparel Marcaine injected V lock closure of fascia 2 and subtenons tissue skin staple closure postop dressing and transferred to care room in stable condition.

## 2019-05-07 NOTE — Plan of Care (Signed)
Pt slightly hypotensive upon PACU bringing pt up to 5N, 91/51. Upon second VS check a few minutes later, pt was 92/61. Notified Yates MD, placed orders for 323mL fluid bolus and 1/2 NS 32mL/hr. MEWS started on yellow "2" but transitioned back to green after the second check on the VS. Marissa, Charge RN stated notification of VS to provider was sufficient and that we don't need to start MEWS protocol succession of VS checks. Pt alert and oriented, tolerating fluids. Will continue to monitor.   Problem: Education: Goal: Knowledge of the prescribed therapeutic regimen will improve Outcome: Progressing Goal: Understanding of discharge needs will improve Outcome: Progressing Goal: Individualized Educational Video(s) Outcome: Progressing   Problem: Activity: Goal: Ability to avoid complications of mobility impairment will improve Outcome: Progressing Goal: Ability to tolerate increased activity will improve Outcome: Progressing   Problem: Clinical Measurements: Goal: Postoperative complications will be avoided or minimized Outcome: Progressing   Problem: Pain Management: Goal: Pain level will decrease with appropriate interventions Outcome: Progressing   Problem: Skin Integrity: Goal: Will show signs of wound healing Outcome: Progressing

## 2019-05-07 NOTE — Anesthesia Postprocedure Evaluation (Signed)
Anesthesia Post Note  Patient: Natalie Tanner  Procedure(s) Performed: LEFT TOTAL HIP ARTHROPLASTY DIRECT ANTERIOR (Left Hip)     Patient location during evaluation: PACU Anesthesia Type: Spinal Level of consciousness: awake and alert, patient cooperative and oriented Pain management: pain level controlled Vital Signs Assessment: post-procedure vital signs reviewed and stable Respiratory status: spontaneous breathing, nonlabored ventilation and respiratory function stable Cardiovascular status: stable Postop Assessment: no apparent nausea or vomiting and spinal receding Anesthetic complications: no    Last Vitals:  Vitals:   05/07/19 1300 05/07/19 1305  BP: (!) 78/54 (!) 101/58  Pulse: 63 64  Resp: 10 11  Temp:    SpO2: 100% 100%    Last Pain:  Vitals:   05/07/19 1240  TempSrc:   PainSc: 0-No pain                 Murl Golladay,E. Latorya Bautch

## 2019-05-07 NOTE — Anesthesia Procedure Notes (Signed)
Procedure Name: MAC Date/Time: 05/07/2019 10:45 AM Performed by: Amadeo Garnet, CRNA Pre-anesthesia Checklist: Patient identified, Emergency Drugs available, Suction available and Patient being monitored Patient Re-evaluated:Patient Re-evaluated prior to induction Oxygen Delivery Method: Nasal cannula Preoxygenation: Pre-oxygenation with 100% oxygen Placement Confirmation: positive ETCO2 Dental Injury: Teeth and Oropharynx as per pre-operative assessment

## 2019-05-07 NOTE — Progress Notes (Signed)
PT Cancellation Note  Patient Details Name: Natalie Tanner MRN: [id number] DOB: 12/30/1960   Cancelled Treatment:    Reason Eval/Treat Not Completed: Fatigue/lethargy limiting ability to participate Pt lethargic and unable to keep eyes open to participate. RN notified. Will follow up as schedule allows.   Lou Miner, DPT  Acute Rehabilitation Services  Pager: [phone] Office: [phone]    Natalie Tanner 05/07/2019, 4:55 PM

## 2019-05-07 NOTE — Transfer of Care (Signed)
Immediate Anesthesia Transfer of Care Note  Patient: Natalie Tanner  Procedure(s) Performed: LEFT TOTAL HIP ARTHROPLASTY DIRECT ANTERIOR (Left Hip)  Patient Location: PACU  Anesthesia Type:MAC combined with regional for post-op pain  Level of Consciousness: awake, alert  and oriented  Airway & Oxygen Therapy: Patient Spontanous Breathing  Post-op Assessment: Report given to RN, Post -op Vital signs reviewed and stable and Patient moving all extremities  Post vital signs: Reviewed and stable  Last Vitals:  Vitals Value Taken Time  BP    Temp    Pulse 66 05/07/19 1237  Resp 12 05/07/19 1237  SpO2 93 % 05/07/19 1237  Vitals shown include unvalidated device data.  Last Pain:  Vitals:   05/07/19 0815  TempSrc:   PainSc: 5       Patients Stated Pain Goal: 4 (27/67/01 1003)  Complications: No apparent anesthesia complications

## 2019-05-07 NOTE — Interval H&P Note (Signed)
History and Physical Interval Note:  05/07/2019 9:24 AM  Natalie Tanner  has presented today for surgery, with the diagnosis of left hip osteoarthritis.  The various methods of treatment have been discussed with the patient and family. After consideration of risks, benefits and other options for treatment, the patient has consented to  Procedure(s): LEFT TOTAL HIP ARTHROPLASTY DIRECT ANTERIOR (Left) as a surgical intervention.  The patient's history has been reviewed, patient examined, no change in status, stable for surgery.  I have reviewed the patient's chart and labs.  Questions were answered to the patient's satisfaction.     Marybelle Killings

## 2019-05-08 ENCOUNTER — Observation Stay (HOSPITAL_COMMUNITY)

## 2019-05-08 DIAGNOSIS — M25542 Pain in joints of left hand: Secondary | ICD-10-CM | POA: Diagnosis present

## 2019-05-08 DIAGNOSIS — E669 Obesity, unspecified: Secondary | ICD-10-CM | POA: Diagnosis present

## 2019-05-08 DIAGNOSIS — M19049 Primary osteoarthritis, unspecified hand: Secondary | ICD-10-CM | POA: Diagnosis present

## 2019-05-08 DIAGNOSIS — Z20822 Contact with and (suspected) exposure to covid-19: Secondary | ICD-10-CM | POA: Diagnosis present

## 2019-05-08 DIAGNOSIS — F329 Major depressive disorder, single episode, unspecified: Secondary | ICD-10-CM | POA: Diagnosis present

## 2019-05-08 DIAGNOSIS — D62 Acute posthemorrhagic anemia: Secondary | ICD-10-CM | POA: Diagnosis not present

## 2019-05-08 DIAGNOSIS — I10 Essential (primary) hypertension: Secondary | ICD-10-CM

## 2019-05-08 DIAGNOSIS — I999 Unspecified disorder of circulatory system: Secondary | ICD-10-CM | POA: Diagnosis present

## 2019-05-08 DIAGNOSIS — Z6835 Body mass index (BMI) 35.0-35.9, adult: Secondary | ICD-10-CM | POA: Diagnosis not present

## 2019-05-08 DIAGNOSIS — E1151 Type 2 diabetes mellitus with diabetic peripheral angiopathy without gangrene: Secondary | ICD-10-CM | POA: Diagnosis present

## 2019-05-08 DIAGNOSIS — E782 Mixed hyperlipidemia: Secondary | ICD-10-CM | POA: Diagnosis present

## 2019-05-08 DIAGNOSIS — E1159 Type 2 diabetes mellitus with other circulatory complications: Secondary | ICD-10-CM

## 2019-05-08 DIAGNOSIS — I2510 Atherosclerotic heart disease of native coronary artery without angina pectoris: Secondary | ICD-10-CM | POA: Diagnosis present

## 2019-05-08 DIAGNOSIS — M1612 Unilateral primary osteoarthritis, left hip: Secondary | ICD-10-CM | POA: Diagnosis present

## 2019-05-08 DIAGNOSIS — K219 Gastro-esophageal reflux disease without esophagitis: Secondary | ICD-10-CM | POA: Diagnosis present

## 2019-05-08 DIAGNOSIS — F419 Anxiety disorder, unspecified: Secondary | ICD-10-CM | POA: Diagnosis present

## 2019-05-08 DIAGNOSIS — I959 Hypotension, unspecified: Secondary | ICD-10-CM

## 2019-05-08 DIAGNOSIS — Z87891 Personal history of nicotine dependence: Secondary | ICD-10-CM | POA: Diagnosis not present

## 2019-05-08 DIAGNOSIS — Z8673 Personal history of transient ischemic attack (TIA), and cerebral infarction without residual deficits: Secondary | ICD-10-CM | POA: Diagnosis not present

## 2019-05-08 DIAGNOSIS — Z7984 Long term (current) use of oral hypoglycemic drugs: Secondary | ICD-10-CM | POA: Diagnosis not present

## 2019-05-08 DIAGNOSIS — Z79899 Other long term (current) drug therapy: Secondary | ICD-10-CM | POA: Diagnosis not present

## 2019-05-08 DIAGNOSIS — E860 Dehydration: Secondary | ICD-10-CM | POA: Diagnosis not present

## 2019-05-08 DIAGNOSIS — M25541 Pain in joints of right hand: Secondary | ICD-10-CM | POA: Diagnosis present

## 2019-05-08 DIAGNOSIS — I252 Old myocardial infarction: Secondary | ICD-10-CM | POA: Diagnosis not present

## 2019-05-08 DIAGNOSIS — Z823 Family history of stroke: Secondary | ICD-10-CM | POA: Diagnosis not present

## 2019-05-08 LAB — HEPATIC FUNCTION PANEL
ALT: 15 U/L (ref 0–44)
AST: 30 U/L (ref 15–41)
Albumin: 2.9 g/dL — ABNORMAL LOW (ref 3.5–5.0)
Alkaline Phosphatase: 29 U/L — ABNORMAL LOW (ref 38–126)
Bilirubin, Direct: 0.2 mg/dL (ref 0.0–0.2)
Indirect Bilirubin: 1.1 mg/dL — ABNORMAL HIGH (ref 0.3–0.9)
Total Bilirubin: 1.3 mg/dL — ABNORMAL HIGH (ref 0.3–1.2)
Total Protein: 5 g/dL — ABNORMAL LOW (ref 6.5–8.1)

## 2019-05-08 LAB — CBC
HCT: 28.8 % — ABNORMAL LOW (ref 36.0–46.0)
HCT: 30.2 % — ABNORMAL LOW (ref 36.0–46.0)
Hemoglobin: 9.3 g/dL — ABNORMAL LOW (ref 12.0–15.0)
Hemoglobin: 9.9 g/dL — ABNORMAL LOW (ref 12.0–15.0)
MCH: 31.7 pg (ref 26.0–34.0)
MCH: 32.6 pg (ref 26.0–34.0)
MCHC: 32.3 g/dL (ref 30.0–36.0)
MCHC: 32.8 g/dL (ref 30.0–36.0)
MCV: 98.3 fL (ref 80.0–100.0)
MCV: 99.3 fL (ref 80.0–100.0)
Platelets: 155 10*3/uL (ref 150–400)
Platelets: 165 10*3/uL (ref 150–400)
RBC: 2.93 MIL/uL — ABNORMAL LOW (ref 3.87–5.11)
RBC: 3.04 MIL/uL — ABNORMAL LOW (ref 3.87–5.11)
RDW: 13.3 % (ref 11.5–15.5)
RDW: 13.4 % (ref 11.5–15.5)
WBC: 5.6 10*3/uL (ref 4.0–10.5)
WBC: 6.5 10*3/uL (ref 4.0–10.5)
nRBC: 0 % (ref 0.0–0.2)
nRBC: 0 % (ref 0.0–0.2)

## 2019-05-08 LAB — BASIC METABOLIC PANEL
Anion gap: 8 (ref 5–15)
BUN: 20 mg/dL (ref 6–20)
CO2: 25 mmol/L (ref 22–32)
Calcium: 8.2 mg/dL — ABNORMAL LOW (ref 8.9–10.3)
Chloride: 103 mmol/L (ref 98–111)
Creatinine, Ser: 1.29 mg/dL — ABNORMAL HIGH (ref 0.44–1.00)
GFR calc Af Amer: 53 mL/min — ABNORMAL LOW (ref >60)
GFR calc non Af Amer: 46 mL/min — ABNORMAL LOW (ref >60)
Glucose, Bld: 165 mg/dL — ABNORMAL HIGH (ref 70–99)
Potassium: 4.1 mmol/L (ref 3.5–5.1)
Sodium: 136 mmol/L (ref 135–145)

## 2019-05-08 LAB — GLUCOSE, CAPILLARY
Glucose-Capillary: 114 mg/dL — ABNORMAL HIGH (ref 70–99)
Glucose-Capillary: 104 mg/dL — ABNORMAL HIGH (ref 70–99)
Glucose-Capillary: 126 mg/dL — ABNORMAL HIGH (ref 70–99)
Glucose-Capillary: 151 mg/dL — ABNORMAL HIGH (ref 70–99)

## 2019-05-08 LAB — PROCALCITONIN: Procalcitonin: 0.35 ng/mL

## 2019-05-08 LAB — TROPONIN I (HIGH SENSITIVITY): Troponin I (High Sensitivity): 4 ng/L (ref <18)

## 2019-05-08 LAB — TYPE AND SCREEN
ABO/RH(D): O POS
Antibody Screen: NEGATIVE

## 2019-05-08 LAB — LACTIC ACID, PLASMA
Lactic Acid, Venous: 1.9 mmol/L (ref 0.5–1.9)
Lactic Acid, Venous: 1 mmol/L (ref 0.5–1.9)

## 2019-05-08 LAB — CORTISOL: Cortisol, Plasma: 14.8 ug/dL

## 2019-05-08 MED ORDER — SODIUM CHLORIDE 0.9 % IV BOLUS
500.0000 mL | Freq: Once | INTRAVENOUS | Status: AC
  Administered 2019-05-08: 500 mL via INTRAVENOUS

## 2019-05-08 MED ORDER — SODIUM CHLORIDE 0.9 % IV BOLUS
1000.0000 mL | Freq: Once | INTRAVENOUS | Status: AC
  Administered 2019-05-08: 1000 mL via INTRAVENOUS

## 2019-05-08 MED ORDER — INSULIN ASPART 100 UNIT/ML ~~LOC~~ SOLN
0.0000 [IU] | Freq: Three times a day (TID) | SUBCUTANEOUS | Status: DC
  Administered 2019-05-08 – 2019-05-09 (×2): 2 [IU] via SUBCUTANEOUS

## 2019-05-08 MED ORDER — CALCIUM CARBONATE-VITAMIN D 500-200 MG-UNIT PO TABS
1.0000 | ORAL_TABLET | Freq: Two times a day (BID) | ORAL | Status: DC
  Administered 2019-05-08 – 2019-05-09 (×3): 1 via ORAL
  Filled 2019-05-08 (×3): qty 1

## 2019-05-08 MED ORDER — CALCIUM CITRATE-VITAMIN D 500-500 MG-UNIT PO CHEW: 1.0000 | CHEWABLE_TABLET | Freq: Two times a day (BID) | ORAL | Status: DC

## 2019-05-08 MED ORDER — SODIUM CHLORIDE 0.9 % IV SOLN: INTRAVENOUS | Status: AC

## 2019-05-08 NOTE — Progress Notes (Addendum)
     Subjective: 1 Day Post-Op Procedure(s) (LRB): LEFT TOTAL HIP ARTHROPLASTY DIRECT ANTERIOR (Left) Call from nursing patient is running low blood pressure since surgery, bolus 300 cc and received albumin in PACU. This evening  BP 87 systolic and she was bolused 500 cc of NS as she is diabetic, no CBG or periop insulin orders in chart. Review of chart shows she has history of Hypertension and significant CAD with EF 55% several 95-99% occluded vessels and history of NSTEMI admission. Apparently has had bypass surgery and history of stroke. Fatigue and unable to participate in PT this evening due to tiredness and fatigue.   Patient reports pain as moderate.    Objective:   VITALS:  Temp:  [97.3 F (36.3 C)-98.4 F (36.9 C)] 98.4 F (36.9 C) (03/05 1956) Pulse Rate:  [58-97] 92 (03/06 0053) Resp:  [10-20] 15 (03/05 1500) BP: (56-120)/(36-68) 90/58 (03/06 0053) SpO2:  [93 %-100 %] 99 % (03/05 1956) Weight:  [92.1 kg] 92.1 kg (03/05 0759)  Neurologically intact ABD soft Neurovascular intact Sensation intact distally Intact pulses distally Dorsiflexion/Plantar flexion intact Incision: dressing C/D/I and no drainage She is awake and warm and conversant, Ox4. Conjuctiva edema.  LABS Recent Labs    05/08/19 0025  HGB 9.9*  WBC 6.5  PLT 165   No results for input(s): NA, K, CL, CO2, BUN, CREATININE, GLUCOSE in the last 72 hours. No results for input(s): LABPT, INR in the last 72 hours. Hospitalist consulted due to significant medical history and apparent Low BP. Also noted to be on Tradjenta, and I am concern that this may not be a medication she should take due to significant CAD. The  Invokana, glypzide, actos and tradjenta are discontinued and she is started on a sliding scale.   Assessment/Plan: 1 Day Post-Op Procedure(s) (LRB): LEFT TOTAL HIP ARTHROPLASTY DIRECT ANTERIOR (Left) Anemia due to acute blood loss CAD Low blood pressure.   Advance diet Up with therapy   Hospitalist Dr. Hal Hope  is seeing this patient and I appreciated his help with her as she has significant comorbidities and will likely need changes with her antihyperglycemic meds prior to discharge. Careful evaluation to ensure her medical conditions remain stable. Agree with transfuse as she is likely to have more blood loss over the next day and her history of CAD and stroke suggest that we should try to  Maintain adequate BP and VS to allow for therapy and improved circulation.  Basil Dess 05/08/2019, 1:23 AMPatient ID: Natalie Tanner, female   DOB: October 15, 1960, 59 y.o.   MRN: [id number]
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
## 2019-05-08 NOTE — Consult Note (Signed)
Medical Consultation   Natalie Tanner  [id number]  DOB: April 22, 1960  DOA: 05/07/2019  PCP: Theodoro Clock    Requesting physician: Dr. Basil Dess  Reason for consultation: Multiple medical problems   History of Present Illness: 59 year old WF PMHx Anxiety, Depression, Stroke, CAD managed medically, HTN  Hx NSTEMI   Had undergone left hip surgery and was found to be hypotensive and lethargic.  Patient continued to remain hypotensive and we were called to consult.  On exam patient is hypotensive initially with a blood pressure systolic in the [id number] which responded to fluid bolus.  On exam patient very lethargic but oriented to time place and person.  Moving all extremities.  Denies any chest pain or shortness of breath.      Review of Systems:  Review of Systems  Constitutional: Negative.   HENT: Negative.   Eyes: Negative.   Respiratory: Negative.   Cardiovascular: Negative.   Gastrointestinal: Negative.   Genitourinary: Negative.   Musculoskeletal: Positive for falls and joint pain. Negative for back pain, myalgias and neck pain.  Skin: Negative.   Neurological: Negative.   Endo/Heme/Allergies: Negative.   Psychiatric/Behavioral: Negative.     Past Medical History: Past Medical History:  Diagnosis Date  . Anginal pain ([place])   . Anxiety   . Coronary atherosclerosis of native coronary artery    Managed medically following cardiac catheterization 5/12  . Depression   . Essential hypertension, benign   . GERD (gastroesophageal reflux disease)   . History of kidney stones   . Iron deficiency anemia    Secondary to menometrorrhagia  . Mixed hyperlipidemia   . Myocardial infarction [facility])    NSTEMI 5/12  . Nephrolithiasis   . Stroke ([place]) 2017  . Type 2 diabetes mellitus ([place])     Past Surgical History: Past Surgical History:  Procedure Laterality Date  . BACK SURGERY    . KNEE SURGERY    . SINUS SURGERY WITH INSTATRAK        Allergies:  No Known Allergies   Social History:  reports that she quit smoking about 18 years ago. Her smoking use included cigarettes. She has a 20.00 pack-year smoking history. She has never used smokeless tobacco. She reports that she does not drink alcohol or use drugs.   Family History: Family History  Problem Relation Age of Onset  . Other Father        Clostridium difficile  . Stroke Father      Procedures/Significant Events:  Hx of total hip arthroplasty, left  I have personally reviewed and interpreted all radiology studies and my findings are as above.  VENTILATOR SETTINGS:   Cultures   Antimicrobials:    Devices    LINES / TUBES:      Continuous Infusions: . sodium chloride 125 mL/hr at 05/08/19 1051  . methocarbamol (ROBAXIN) IV       Physical Exam: Vitals:   05/08/19 0740 05/08/19 0900 05/08/19 1046 05/08/19 1355  BP: 99/60 (!) 91/52 108/63 126/70  Pulse: (!) 106  68 (!) 110  Resp: 16 18  18   Temp: 98.9 F (37.2 C)   98.9 F (37.2 C)  TempSrc: Oral   Oral  SpO2: 96% 96%  99%  Weight:      Height:        General: A/O x4, no acute respiratory distress Eyes: negative scleral hemorrhage, negative anisocoria, negative icterus ENT: Negative Runny  nose, negative gingival bleeding, Neck:  Negative scars, masses, torticollis, lymphadenopathy, JVD Lungs: Clear to auscultation bilaterally without wheezes or crackles Cardiovascular: Regular rate and rhythm without murmur gallop or rub normal S1 and S2 Abdomen: negative abdominal pain, nondistended, positive soft, bowel sounds, no rebound, no ascites, no appreciable mass Extremities: Positive left lower extremity pain appropriate to recent surgery Skin: Negative rashes, lesions, ulcers Psychiatric:  Negative depression, negative anxiety, negative fatigue, negative mania  Central nervous system:  Cranial nerves II through XII intact, tongue/uvula midline, all extremities muscle strength  5/5, sensation intact throughout, negative dysarthria, negative expressive aphasia, negative receptive aphasia.  Data reviewed:  I have personally reviewed following labs and imaging studies Labs:  CBC: Recent Labs  Lab 05/08/19 0025 05/08/19 0620  WBC 6.5 5.6  HGB 9.9* 9.3*  HCT 30.2* 28.8*  MCV 99.3 98.3  PLT 165 [id number]    Basic Metabolic Panel: Recent Labs  Lab 05/08/19 0025  NA 136  K 4.1  CL 103  CO2 25  GLUCOSE 165*  BUN 20  CREATININE 1.29*  CALCIUM 8.2*   GFR Estimated Creatinine Clearance: 51.9 mL/min (A) (by C-G formula based on SCr of 1.29 mg/dL (H)). Liver Function Tests: Recent Labs  Lab 05/08/19 0137  AST 30  ALT 15  ALKPHOS 29*  BILITOT 1.3*  PROT 5.0*  ALBUMIN 2.9*   No results for input(s): LIPASE, AMYLASE in the last 168 hours. No results for input(s): AMMONIA in the last 168 hours. Coagulation profile No results for input(s): INR, PROTIME in the last 168 hours.  Cardiac Enzymes: No results for input(s): CKTOTAL, CKMB, CKMBINDEX, TROPONINI in the last 168 hours. BNP: Invalid input(s): POCBNP CBG: Recent Labs  Lab 05/07/19 1017 05/07/19 1241 05/07/19 2318 05/08/19 0657 05/08/19 1107  GLUCAP 86 92 161* 114* 104*   D-Dimer No results for input(s): DDIMER in the last 72 hours. Hgb A1c No results for input(s): HGBA1C in the last 72 hours. Lipid Profile No results for input(s): CHOL, HDL, LDLCALC, TRIG, CHOLHDL, LDLDIRECT in the last 72 hours. Thyroid function studies No results for input(s): TSH, T4TOTAL, T3FREE, THYROIDAB in the last 72 hours.  Invalid input(s): FREET3 Anemia work up No results for input(s): VITAMINB12, FOLATE, FERRITIN, TIBC, IRON, RETICCTPCT in the last 72 hours. Urinalysis    Component Value Date/Time   COLORURINE YELLOW 04/29/2019 0948   APPEARANCEUR HAZY (A) 04/29/2019 0948   LABSPEC 1.028 04/29/2019 0948   PHURINE 5.0 04/29/2019 0948   GLUCOSEU >=500 (A) 04/29/2019 0948   HGBUR NEGATIVE 04/29/2019 0948    BILIRUBINUR NEGATIVE 04/29/2019 0948   KETONESUR NEGATIVE 04/29/2019 0948   PROTEINUR NEGATIVE 04/29/2019 0948   NITRITE NEGATIVE 04/29/2019 0948   LEUKOCYTESUR NEGATIVE 04/29/2019 0948     Microbiology Recent Results (from the past 240 hour(s))  Surgical pcr screen     Status: Abnormal   Collection Time: 04/29/19  9:49 AM   Specimen: Nasal Mucosa; Nasal Swab  Result Value Ref Range Status   MRSA, PCR NEGATIVE NEGATIVE Final   Staphylococcus aureus POSITIVE (A) NEGATIVE Final    Comment: (NOTE) The Xpert SA Assay (FDA approved for NASAL specimens in patients 16 years of age and older), is one component of a comprehensive surveillance program. It is not intended to diagnose infection nor to guide or monitor treatment. Performed at [facility], [place] [address] [place], [place] [address]   SARS CORONAVIRUS 2 (TAT 6-24 HRS) Nasopharyngeal Nasopharyngeal Swab     Status: None   Collection  Time: 05/04/19  6:48 AM   Specimen: Nasopharyngeal Swab  Result Value Ref Range Status   SARS Coronavirus 2 NEGATIVE NEGATIVE Final    Comment: (NOTE) SARS-CoV-2 target nucleic acids are NOT DETECTED. The SARS-CoV-2 RNA is generally detectable in upper and lower respiratory specimens during the acute phase of infection. Negative results do not preclude SARS-CoV-2 infection, do not rule out co-infections with other pathogens, and should not be used as the sole basis for treatment or other patient management decisions. Negative results must be combined with clinical observations, patient history, and epidemiological information. The expected result is Negative. Fact Sheet for Patients: SugarRoll.be Fact Sheet for Healthcare Providers: https://www.Delayni Streed-mathews.com/ This test is not yet approved or cleared by the [place] FDA and  has been authorized for detection and/or diagnosis of SARS-CoV-2 by FDA under an Emergency Use Authorization  (EUA). This EUA will remain  in effect (meaning this test can be used) for the duration of the COVID-19 declaration under Section 56 4(b)(1) of the Act, 21 U.S.C. section 360bbb-3(b)(1), unless the authorization is terminated or revoked sooner. Performed at [facility], [place] [address] [place], [place] [address]   Culture, blood (routine x 2)     Status: None (Preliminary result)   Collection Time: 05/08/19  1:37 AM   Specimen: BLOOD  Result Value Ref Range Status   Specimen Description BLOOD RIGHT ANTECUBITAL  Final   Special Requests   Final    BOTTLES DRAWN AEROBIC AND ANAEROBIC Blood Culture adequate volume   Culture   Final    NO GROWTH < 12 HOURS Performed at [facility], [place] [address], [place], [place] [address]    Report Status PENDING  Incomplete  Culture, blood (routine x 2)     Status: None (Preliminary result)   Collection Time: 05/08/19  1:43 AM   Specimen: BLOOD RIGHT HAND  Result Value Ref Range Status   Specimen Description BLOOD RIGHT HAND  Final   Special Requests   Final    BOTTLES DRAWN AEROBIC AND ANAEROBIC Blood Culture adequate volume   Culture   Final    NO GROWTH < 12 HOURS Performed at [facility], [place] [address] [place], [place] [address]    Report Status PENDING  Incomplete       Inpatient Medications:   Scheduled Meds: . acetaminophen  1,000 mg Oral Q6H  . aspirin EC  325 mg Oral Q breakfast  . calcium-vitamin D  1 tablet Oral BID  . docusate sodium  100 mg Oral BID  . ferrous sulfate  325 mg Oral TID PC  . gabapentin  300 mg Oral TID  . insulin aspart  0-15 Units Subcutaneous TID WC  . pantoprazole  40 mg Oral Daily  . PARoxetine  20 mg Oral [id number]  . rosuvastatin  10 mg Oral Daily   Continuous Infusions: . sodium chloride 125 mL/hr at 05/08/19 1051  . methocarbamol (ROBAXIN) IV       Radiological Exams on Admission: DG Chest Port 1 View  Result Date: 05/08/2019 CLINICAL DATA:  Shortness of breath. EXAM:  PORTABLE CHEST 1 VIEW COMPARISON:  04/29/2019 FINDINGS: Lower lung volumes from prior exam. Unchanged heart size and mediastinal contours allowing for differences in technique. Streaky opacities in both lung bases, left greater than right. No pulmonary edema, pleural effusion or pneumothorax. Remote left rib fracture. IMPRESSION: Streaky bibasilar opacities, left greater than right, likely atelectasis. Electronically Signed   By: Aurther Loft.D.  On: 05/08/2019 01:52   DG C-Arm 1-60 Min  Result Date: 05/07/2019 CLINICAL DATA:  Left hip arthroplasty EXAM: OPERATIVE LEFT HIP (WITH PELVIS IF PERFORMED) AP VIEWS TECHNIQUE: Fluoroscopic spot image(s) were submitted for interpretation post-operatively. COMPARISON:  01/20/2019 FINDINGS: 4 C-arm fluoroscopic images were obtained intraoperatively and submitted for post operative interpretation. Images demonstrate placement of left total hip arthroplasty hardware in proper alignment without periprosthetic fracture. 22 seconds of fluoroscopy time was utilized. Please see the performing provider's procedural report for further detail. IMPRESSION: As above. Electronically Signed   By: Davina Poke D.O.   On: 05/07/2019 13:27   DG HIP OPERATIVE UNILAT W OR W/O PELVIS LEFT  Result Date: 05/07/2019 CLINICAL DATA:  Left hip arthroplasty EXAM: OPERATIVE LEFT HIP (WITH PELVIS IF PERFORMED) AP VIEWS TECHNIQUE: Fluoroscopic spot image(s) were submitted for interpretation post-operatively. COMPARISON:  01/20/2019 FINDINGS: 4 C-arm fluoroscopic images were obtained intraoperatively and submitted for post operative interpretation. Images demonstrate placement of left total hip arthroplasty hardware in proper alignment without periprosthetic fracture. 22 seconds of fluoroscopy time was utilized. Please see the performing provider's procedural report for further detail. IMPRESSION: As above. Electronically Signed   By: Davina Poke D.O.   On: 05/07/2019 13:27     Impression/Recommendations Active Problems:   Essential hypertension, benign   Surgery, elective   Hx of total hip arthroplasty, left   Hypotension   Acute blood loss anemia   Type 2 diabetes mellitus with vascular disease ([place])   1. Hypotension -presently patient is afebrile no definite signs of any sepsis.  Patient is responding to fluids.  Suspect patient's hypotension could be from dehydration and blood loss anemia given the hemoglobin has dropped by 4 g from previous recently.  We will continue with hydration and closely monitor respiratory status and will hold off antihypertensives.  In addition I have ordered lactic acid procalcitonin levels cortisol levels chest x-ray.  Closely monitor. 2. Acute blood loss anemia likely from surgery.  Will closely monitor CBC.  Presently hemoglobin is around 9.  If there is any further decline will need blood transfusion.  We will also transfuse if patient blood pressure becomes hypotensive again. 3. CAD being medically managed.  Denies any chest pain.  EKG shows normal sinus rhythm.  Troponins negative.  Closely monitor. 4. History of hypertension presently holding antihypertensives due to hypotension. 5. Diabetes mellitus type 2 we will hold all antidiabetic oral medications and keep patient on sliding scale coverage for now. 6. Patient is mildly lethargic closely observe.  If continues to be lethargic will need to get ABG and hold all pain medications and Xanax and gabapentin. 7. Left hip surgery per orthopedics   Thank you for this consultation.  Our [facility] hospitalist team will follow the patient with you.   Time Spent:   Allie Bossier M.D. [facility] 05/08/2019, 2:16 PM  [id number]

## 2019-05-08 NOTE — Plan of Care (Signed)
  Problem: Activity: Goal: Ability to avoid complications of mobility impairment will improve 05/08/2019 0454 by Dorena Cookey, LPN Outcome: Progressing 05/08/2019 0041 by Dorena Cookey, LPN Outcome: Progressing Goal: Ability to tolerate increased activity will improve 05/08/2019 0454 by Dorena Cookey, LPN Outcome: Progressing 05/08/2019 0041 by Dorena Cookey, LPN Outcome: Progressing   Problem: Clinical Measurements: Goal: Postoperative complications will be avoided or minimized 05/08/2019 0454 by Dorena Cookey, LPN Outcome: Progressing 05/08/2019 0041 by Dorena Cookey, LPN Outcome: Progressing   Problem: Pain Management: Goal: Pain level will decrease with appropriate interventions 05/08/2019 0454 by Dorena Cookey, LPN Outcome: Progressing 05/08/2019 0041 by Dorena Cookey, LPN Outcome: Progressing   Problem: Skin Integrity: Goal: Will show signs of wound healing 05/08/2019 0454 by Dorena Cookey, LPN Outcome: Progressing 05/08/2019 0041 by Dorena Cookey, LPN Outcome: Progressing

## 2019-05-08 NOTE — Progress Notes (Signed)
     Subjective: 1 Day Post-Op Procedure(s) (LRB): LEFT TOTAL HIP ARTHROPLASTY DIRECT ANTERIOR (Left) Awake, alert and oriented x 4. She is tired, fatigue. Continues with low blood pressure. Hgbn 9.3 this AM  Cr 1.29, periop blood loss anemia with hypovolemia. Appreciate hospitalist help.   Patient reports pain as moderate.    Objective:   VITALS:  Temp:  [97.3 F (36.3 C)-98.9 F (37.2 C)] 98.9 F (37.2 C) (03/06 0740) Pulse Rate:  [58-110] 106 (03/06 0740) Resp:  [10-20] 18 (03/06 0900) BP: (56-120)/(36-68) 91/52 (03/06 0900) SpO2:  [93 %-100 %] 96 % (03/06 0900)  Neurologically intact ABD soft Neurovascular intact Sensation intact distally Intact pulses distally Dorsiflexion/Plantar flexion intact Incision: scant drainage   LABS Recent Labs    05/08/19 0025 05/08/19 0620  HGB 9.9* 9.3*  WBC 6.5 5.6  PLT 165 155   Recent Labs    05/08/19 0025  NA 136  K 4.1  CL 103  CO2 25  BUN 20  CREATININE 1.29*  GLUCOSE 165*   No results for input(s): LABPT, INR in the last 72 hours.   Assessment/Plan: 1 Day Post-Op Procedure(s) (LRB): LEFT TOTAL HIP ARTHROPLASTY DIRECT ANTERIOR (Left)  Anemia due to blood loss perioperative.  Advance diet Up with therapy  Continue with IVF for hypovolemia. Follow hgb.   Basil Dess 05/08/2019, 9:13 AMPatient ID: Natalie Tanner, female   DOB: 11-11-1960, 59 y.o.   MRN: [id number]

## 2019-05-08 NOTE — Consult Note (Signed)
[facility] Medical Consultation  Natalie Tanner YCX:[id number] DOB: 10/09/1960 DOA: 05/07/2019 PCP: Terald Sleeper, PA-C   Requesting physician: Dr. Louanne Skye. Date of consultation: May 08, 2019. Reason for consultation: Hypotension.  Impression/Recommendations Active Problems:   Surgery, elective   Hx of total hip arthroplasty, left    1. Hypotension -presently patient is afebrile no definite signs of any sepsis.  Patient is responding to fluids.  Suspect patient's hypotension could be from dehydration and blood loss anemia given the hemoglobin has dropped by 4 g from previous recently.  We will continue with hydration and closely monitor respiratory status and will hold off antihypertensives.  In addition I have ordered lactic acid procalcitonin levels cortisol levels chest x-ray.  Closely monitor. 2. Acute blood loss anemia likely from surgery.  Will closely monitor CBC.  Presently hemoglobin is around 9.  If there is any further decline will need blood transfusion.  We will also transfuse if patient blood pressure becomes hypotensive again. 3. CAD being medically managed.  Denies any chest pain.  EKG shows normal sinus rhythm.  Troponins negative.  Closely monitor. 4. History of hypertension presently holding antihypertensives due to hypotension. 5. Diabetes mellitus type 2 we will hold all antidiabetic oral medications and keep patient on sliding scale coverage for now. 6. Patient is mildly lethargic closely observe.  If continues to be lethargic will need to get ABG and hold all pain medications and Xanax and gabapentin. 7. Left hip surgery per orthopedics.  I will followup again tomorrow. Please contact me if I can be of assistance in the meanwhile. Thank you for this consultation.  Chief Complaint: Hypotension.  HPI:  59 year old female with history of CAD managed medically, hypertension had undergone left hip surgery and was found to be hypotensive and lethargic.  Patient  continued to remain hypotensive and we were called to consult.  On exam patient is hypotensive initially with a blood pressure systolic in the 49F which responded to fluid bolus.  On exam patient very lethargic but oriented to time place and person.  Moving all extremities.  Denies any chest pain or shortness of breath.  Review of Systems:  As presented in history of present illness rest all negative.  Past Medical History:  Diagnosis Date  . Anginal pain ([place])   . Anxiety   . Coronary atherosclerosis of native coronary artery    Managed medically following cardiac catheterization 5/12  . Depression   . Essential hypertension, benign   . GERD (gastroesophageal reflux disease)   . History of kidney stones   . Iron deficiency anemia    Secondary to menometrorrhagia  . Mixed hyperlipidemia   . Myocardial infarction [facility])    NSTEMI 5/12  . Nephrolithiasis   . Stroke ([place]) 2017  . Type 2 diabetes mellitus ([place])    Past Surgical History:  Procedure Laterality Date  . BACK SURGERY    . KNEE SURGERY    . SINUS SURGERY WITH INSTATRAK     Social History:  reports that she quit smoking about 18 years ago. Her smoking use included cigarettes. She has a 20.00 pack-year smoking history. She has never used smokeless tobacco. She reports that she does not drink alcohol or use drugs.  No Known Allergies Family History  Problem Relation Age of Onset  . Other Father        Clostridium difficile  . Stroke Father     Prior to Admission medications   Medication Sig Start Date End Date Taking? Authorizing  Provider  ALPRAZolam Duanne Moron) 1 MG tablet Take 1 tablet (1 mg total) by mouth 2 (two) times daily as needed. 03/19/19  Yes Terald Sleeper, PA-C  empagliflozin (JARDIANCE) 25 MG TABS tablet Take 25 mg by mouth daily. 03/19/19  Yes Terald Sleeper, PA-C  gabapentin (NEURONTIN) 300 MG capsule Take 1 capsule (300 mg total) by mouth 3 (three) times daily. 02/22/19  Yes Hawks, Christy A, FNP  glipiZIDE  (GLUCOTROL) 10 MG tablet Take 1 tablet (10 mg total) by mouth daily. 03/19/19  Yes Terald Sleeper, PA-C  isosorbide mononitrate (IMDUR) 30 MG 24 hr tablet Take 1 tablet (30 mg total) by mouth 2 (two) times daily. 03/19/19  Yes Terald Sleeper, PA-C  lisinopril (ZESTRIL) 10 MG tablet Take 1 tablet (10 mg total) by mouth daily. 03/19/19  Yes Terald Sleeper, PA-C  metoprolol tartrate (LOPRESSOR) 25 MG tablet Take 1 tablet (25 mg total) by mouth 2 (two) times daily. 04/16/19  Yes Terald Sleeper, PA-C  omeprazole (PRILOSEC OTC) 20 MG tablet Take 1 tablet (20 mg total) by mouth daily. 03/19/19  Yes Terald Sleeper, PA-C  PARoxetine (PAXIL) 20 MG tablet Take 1 tablet (20 mg total) by mouth every morning. 03/19/19  Yes Terald Sleeper, PA-C  pioglitazone (ACTOS) 30 MG tablet Take 1 tablet (30 mg total) by mouth daily. 09/16/18  Yes Terald Sleeper, PA-C  rosuvastatin (CRESTOR) 10 MG tablet Take 1 tablet (10 mg total) by mouth daily. 05/03/19 08/01/19 Yes Donato Heinz, MD  sitaGLIPtin (JANUVIA) 100 MG tablet Take 1 tablet (100 mg total) by mouth daily. 03/19/19  Yes Terald Sleeper, PA-C  blood glucose meter kit and supplies KIT Dispense based on patient and insurance preference. Use up to four times daily as directed. For diabetes E11.9, with hyperglycemia. 11/26/16   Terald Sleeper, PA-C  meclizine (ANTIVERT) 25 MG tablet Take 1 tablet (25 mg total) by mouth 3 (three) times daily as needed for dizziness. 05/23/16   Terald Sleeper, PA-C  nitroGLYCERIN (NITROSTAT) 0.4 MG SL tablet Place 1 tablet (0.4 mg total) under the tongue every 5 (five) minutes as needed for chest pain. 09/16/18   Terald Sleeper, PA-C   Physical Exam: Blood pressure (!) 90/58, pulse 92, temperature 98.4 F (36.9 C), temperature source Oral, resp. rate 15, height 5' 3" (1.6 m), weight 92.1 kg, last menstrual period 07/05/2010, SpO2 99 %. Vitals:   05/07/19 2200 05/08/19 0053  BP: (!) 81/59 (!) 90/58  Pulse: 97 92  Resp:    Temp:    SpO2:        General: Moderately built and nourished.  Eyes: Anicteric no pallor.  ENT: No discharge from the ears eyes nose or mouth.  Neck: No masses.  No neck rigidity.  Cardiovascular: S1-S2 heard.  Respiratory: No rhonchi or crepitations.  Abdomen: Soft nontender bowel sound present.  Skin: No rash.  Musculoskeletal: No obvious ecchymotic areas or hematoma seen.  No edema.  Psychiatric: Patient is mildly lethargic but also has questions appropriately.  Neurologic: Mildly lethargic but answers questions appropriately.  Moves all extremities.  Labs on Admission:  Basic Metabolic Panel: No results for input(s): NA, K, CL, CO2, GLUCOSE, BUN, CREATININE, CALCIUM, MG, PHOS in the last 168 hours. Liver Function Tests: No results for input(s): AST, ALT, ALKPHOS, BILITOT, PROT, ALBUMIN in the last 168 hours. No results for input(s): LIPASE, AMYLASE in the last 168 hours. No results for input(s): AMMONIA in the last 168  hours. CBC: Recent Labs  Lab 05/08/19 0025  WBC 6.5  HGB 9.9*  HCT 30.2*  MCV 99.3  PLT 165   Cardiac Enzymes: No results for input(s): CKTOTAL, CKMB, CKMBINDEX, TROPONINI in the last 168 hours. BNP: Invalid input(s): POCBNP CBG: Recent Labs  Lab 05/07/19 0803 05/07/19 0855 05/07/19 1017 05/07/19 1241 05/07/19 2318  GLUCAP 76 87 86 92 161*    Radiological Exams on Admission: DG C-Arm 1-60 Min  Result Date: 05/07/2019 CLINICAL DATA:  Left hip arthroplasty EXAM: OPERATIVE LEFT HIP (WITH PELVIS IF PERFORMED) AP VIEWS TECHNIQUE: Fluoroscopic spot image(s) were submitted for interpretation post-operatively. COMPARISON:  01/20/2019 FINDINGS: 4 C-arm fluoroscopic images were obtained intraoperatively and submitted for post operative interpretation. Images demonstrate placement of left total hip arthroplasty hardware in proper alignment without periprosthetic fracture. 22 seconds of fluoroscopy time was utilized. Please see the performing provider's procedural  report for further detail. IMPRESSION: As above. Electronically Signed   By: Davina Poke D.O.   On: 05/07/2019 13:27   DG HIP OPERATIVE UNILAT W OR W/O PELVIS LEFT  Result Date: 05/07/2019 CLINICAL DATA:  Left hip arthroplasty EXAM: OPERATIVE LEFT HIP (WITH PELVIS IF PERFORMED) AP VIEWS TECHNIQUE: Fluoroscopic spot image(s) were submitted for interpretation post-operatively. COMPARISON:  01/20/2019 FINDINGS: 4 C-arm fluoroscopic images were obtained intraoperatively and submitted for post operative interpretation. Images demonstrate placement of left total hip arthroplasty hardware in proper alignment without periprosthetic fracture. 22 seconds of fluoroscopy time was utilized. Please see the performing provider's procedural report for further detail. IMPRESSION: As above. Electronically Signed   By: Davina Poke D.O.   On: 05/07/2019 13:27    EKG: Independently reviewed.  Normal sinus rhythm.  Time spent: 50 minutes.  Rise Patience [facility]  If 7PM-7AM, please contact night-coverage [email or web address] Password [email or web address] 05/08/2019, 1:09 AM
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
## 2019-05-08 NOTE — Plan of Care (Signed)
  Problem: Activity: Goal: Ability to avoid complications of mobility impairment will improve Outcome: Progressing Goal: Ability to tolerate increased activity will improve Outcome: Progressing   Problem: Clinical Measurements: Goal: Postoperative complications will be avoided or minimized Outcome: Progressing   Problem: Pain Management: Goal: Pain level will decrease with appropriate interventions Outcome: Progressing   Problem: Skin Integrity: Goal: Will show signs of wound healing Outcome: Progressing   

## 2019-05-08 NOTE — Progress Notes (Signed)
Telephone to MD on call informed of vital signs charted in Epic. Arthor Captain LPN

## 2019-05-08 NOTE — Progress Notes (Signed)
MD on call notified patient bp 81/59 HR 97 New orders received. Will continue to monitor. Arthor Captain LPN

## 2019-05-09 LAB — BASIC METABOLIC PANEL
Anion gap: 8 (ref 5–15)
BUN: 12 mg/dL (ref 6–20)
CO2: 23 mmol/L (ref 22–32)
Calcium: 8.7 mg/dL — ABNORMAL LOW (ref 8.9–10.3)
Chloride: 106 mmol/L (ref 98–111)
Creatinine, Ser: 0.98 mg/dL (ref 0.44–1.00)
GFR calc Af Amer: >60 mL/min (ref >60)
GFR calc non Af Amer: >60 mL/min (ref >60)
Glucose, Bld: 108 mg/dL — ABNORMAL HIGH (ref 70–99)
Potassium: 3.9 mmol/L (ref 3.5–5.1)
Sodium: 137 mmol/L (ref 135–145)

## 2019-05-09 LAB — CBC WITH DIFFERENTIAL/PLATELET
Abs Immature Granulocytes: 0.03 10*3/uL (ref 0.00–0.07)
Basophils Absolute: 0 10*3/uL (ref 0.0–0.1)
Basophils Relative: 0 %
Eosinophils Absolute: 0 10*3/uL (ref 0.0–0.5)
Eosinophils Relative: 0 %
HCT: 27.1 % — ABNORMAL LOW (ref 36.0–46.0)
Hemoglobin: 8.8 g/dL — ABNORMAL LOW (ref 12.0–15.0)
Immature Granulocytes: 1 %
Lymphocytes Relative: 10 %
Lymphs Abs: 0.7 10*3/uL (ref 0.7–4.0)
MCH: 32 pg (ref 26.0–34.0)
MCHC: 32.5 g/dL (ref 30.0–36.0)
MCV: 98.5 fL (ref 80.0–100.0)
Monocytes Absolute: 0.5 10*3/uL (ref 0.1–1.0)
Monocytes Relative: 8 %
Neutro Abs: 5.4 10*3/uL (ref 1.7–7.7)
Neutrophils Relative %: 81 %
Platelets: 130 10*3/uL — ABNORMAL LOW (ref 150–400)
RBC: 2.75 MIL/uL — ABNORMAL LOW (ref 3.87–5.11)
RDW: 13 % (ref 11.5–15.5)
WBC: 6.6 10*3/uL (ref 4.0–10.5)
nRBC: 0 % (ref 0.0–0.2)

## 2019-05-09 LAB — GLUCOSE, CAPILLARY
Glucose-Capillary: 107 mg/dL — ABNORMAL HIGH (ref 70–99)
Glucose-Capillary: 146 mg/dL — ABNORMAL HIGH (ref 70–99)

## 2019-05-09 LAB — HEMOGLOBIN A1C
Hgb A1c MFr Bld: 5.9 % — ABNORMAL HIGH (ref 4.8–5.6)
Mean Plasma Glucose: 122.63 mg/dL

## 2019-05-09 LAB — LIPID PANEL
Cholesterol: 111 mg/dL (ref 0–200)
HDL: 46 mg/dL (ref >40)
LDL Cholesterol: 52 mg/dL (ref 0–99)
Total CHOL/HDL Ratio: 2.4 RATIO
Triglycerides: 63 mg/dL (ref <150)
VLDL: 13 mg/dL (ref 0–40)

## 2019-05-09 LAB — MAGNESIUM: Magnesium: 1.4 mg/dL — ABNORMAL LOW (ref 1.7–2.4)

## 2019-05-09 MED ORDER — OXYCODONE HCL 5 MG PO TABS: 5.0000 mg | ORAL_TABLET | ORAL | 0 refills | Status: DC | PRN

## 2019-05-09 MED ORDER — METOPROLOL TARTRATE 25 MG PO TABS: 12.5000 mg | ORAL_TABLET | Freq: Two times a day (BID) | ORAL | 11 refills | Status: DC

## 2019-05-09 MED ORDER — ASPIRIN 325 MG PO TBEC: 325.0000 mg | DELAYED_RELEASE_TABLET | Freq: Every day | ORAL | 0 refills | Status: DC

## 2019-05-09 MED ORDER — METHOCARBAMOL 500 MG PO TABS: 500.0000 mg | ORAL_TABLET | Freq: Three times a day (TID) | ORAL | 1 refills | Status: DC | PRN

## 2019-05-09 MED ORDER — FERROUS SULFATE 325 (65 FE) MG PO TABS: 325.0000 mg | ORAL_TABLET | Freq: Three times a day (TID) | ORAL | 1 refills | Status: AC

## 2019-05-09 MED ORDER — DOCUSATE SODIUM 100 MG PO CAPS: 100.0000 mg | ORAL_CAPSULE | Freq: Two times a day (BID) | ORAL | 0 refills | Status: DC

## 2019-05-09 NOTE — Progress Notes (Addendum)
     Subjective: 2 Days Post-Op Procedure(s) (LRB): LEFT TOTAL HIP ARTHROPLASTY DIRECT ANTERIOR (Left) Awake, alert and oriented x 4. Incision left hip anteriorly with some minimal spotting, dry blood. Dressing changed.  Patient reports pain as moderate.  Hgb today is 8.8 on ferrous gluconate. Systolic BP is much better today. Will discharge on Fe and recommend follow up lab and BP monitoring while at home with PTOT.  Objective:   VITALS:  Temp:  [98.2 F (36.8 C)-98.9 F (37.2 C)] 98.2 F (36.8 C) (03/07 0727) Pulse Rate:  [109-117] 112 (03/07 0727) Resp:  [18-20] 18 (03/07 0727) BP: (119-135)/(62-70) 135/68 (03/07 0727) SpO2:  [91 %-99 %] 91 % (03/07 0727)  Neurologically intact ABD soft Neurovascular intact Sensation intact distally Intact pulses distally Dorsiflexion/Plantar flexion intact Incision: scant drainage No cellulitis present Compartment soft   LABS Recent Labs    05/08/19 0025 05/08/19 0620  HGB 9.9* 9.3*  WBC 6.5 5.6  PLT 165 155   Recent Labs    05/08/19 0025 05/09/19 0442  NA 136 137  K 4.1 3.9  CL 103 106  CO2 25 23  BUN 20 12  CREATININE 1.29* 0.98  GLUCOSE 165* 108*   No results for input(s): LABPT, INR in the last 72 hours.   Assessment/Plan: 2 Days Post-Op Procedure(s) (LRB): LEFT TOTAL HIP ARTHROPLASTY DIRECT ANTERIOR (Left) Anemia due to blood loss with hypotension. CAD Hypotension. Lab for repeat CBC was ordered but computer indicated that an order was already placed so order was discontinued now there is no  CBC present in the chart for today, I have reordered a CBC. Obviously we need confirmation of stable Hgb prior to discharge. Her blood pressure does seem better today.  I will write orders for discharge today pending PT/OT eval , needs to be able to walk up 2 stairs to her house.  Advance diet Up with therapy Discharge home with home health  Basil Dess 05/09/2019, 11:35 AMPatient ID: Natalie Tanner, female   DOB:  October 26, 1960, 59 y.o.   MRN: [id number]

## 2019-05-09 NOTE — Consult Note (Addendum)
Medical Consultation   Natalie Tanner  [id number]  DOB: 1960/09/08  DOA: 05/07/2019  PCP: Theodoro Clock    Requesting physician: Dr. Basil Dess  Reason for consultation: Multiple medical problems   History of Present Illness: 58 year old WF PMHx Anxiety, Depression, Stroke, CAD managed medically, HTN  Hx NSTEMI   Had undergone left hip surgery and was found to be hypotensive and lethargic.  Patient continued to remain hypotensive and we were called to consult.  On exam patient is hypotensive initially with a blood pressure systolic in the [id number] which responded to fluid bolus.  On exam patient very lethargic but oriented to time place and person.  Moving all extremities.  Denies any chest pain or shortness of breath.  3/7 negative CP, negative S OB, negative abdominal pain.    Review of Systems:  Review of Systems  Constitutional: Negative.   HENT: Negative.   Eyes: Negative.   Respiratory: Negative.   Cardiovascular: Negative.   Gastrointestinal: Negative.   Genitourinary: Negative.   Musculoskeletal: Positive for falls and joint pain. Negative for back pain, myalgias and neck pain.  Skin: Negative.   Neurological: Negative.   Endo/Heme/Allergies: Negative.   Psychiatric/Behavioral: Negative.     Past Medical History: Past Medical History:  Diagnosis Date  . Anginal pain ([place])   . Anxiety   . Coronary atherosclerosis of native coronary artery    Managed medically following cardiac catheterization 5/12  . Depression   . Essential hypertension, benign   . GERD (gastroesophageal reflux disease)   . History of kidney stones   . Iron deficiency anemia    Secondary to menometrorrhagia  . Mixed hyperlipidemia   . Myocardial infarction [facility])    NSTEMI 5/12  . Nephrolithiasis   . Stroke ([place]) 2017  . Type 2 diabetes mellitus ([place])     Past Surgical History: Past Surgical History:  Procedure Laterality Date  . BACK SURGERY    . KNEE  SURGERY    . SINUS SURGERY WITH INSTATRAK       Allergies:  No Known Allergies   Social History:  reports that she quit smoking about 18 years ago. Her smoking use included cigarettes. She has a 20.00 pack-year smoking history. She has never used smokeless tobacco. She reports that she does not drink alcohol or use drugs.   Family History: Family History  Problem Relation Age of Onset  . Other Father        Clostridium difficile  . Stroke Father      Procedures/Significant Events:  Hx of total hip arthroplasty, left  I have personally reviewed and interpreted all radiology studies and my findings are as above.  VENTILATOR SETTINGS:   Cultures   Antimicrobials:    Devices    LINES / TUBES:      Continuous Infusions: . methocarbamol (ROBAXIN) IV       Physical Exam: Vitals:   05/09/19 0458 05/09/19 0727 05/09/19 1235 05/09/19 1306  BP: 133/66 135/68 121/68 122/75  Pulse: (!) 109 (!) 112 94 (!) 103  Resp:  18 18   Temp: 98.2 F (36.8 C) 98.2 F (36.8 C) 98.5 F (36.9 C)   TempSrc: Oral Oral Oral   SpO2: 95% 91% 96%   Weight:      Height:        General: A/O x4, no acute respiratory distress Eyes: negative scleral hemorrhage, negative anisocoria, negative icterus ENT:  Negative Runny nose, negative gingival bleeding, Neck:  Negative scars, masses, torticollis, lymphadenopathy, JVD Lungs: Clear to auscultation bilaterally without wheezes or crackles Cardiovascular: Regular rate and rhythm without murmur gallop or rub normal S1 and S2 Abdomen: negative abdominal pain, nondistended, positive soft, bowel sounds, no rebound, no ascites, no appreciable mass Extremities: Positive left lower extremity pain appropriate to recent surgery Skin: Negative rashes, lesions, ulcers Psychiatric:  Negative depression, negative anxiety, negative fatigue, negative mania  Central nervous system:  Cranial nerves II through XII intact, tongue/uvula midline, all  extremities muscle strength 5/5, sensation intact throughout, negative dysarthria, negative expressive aphasia, negative receptive aphasia.  Data reviewed:  I have personally reviewed following labs and imaging studies Labs:  CBC: Recent Labs  Lab 05/08/19 0025 05/08/19 0620 05/09/19 1203  WBC 6.5 5.6 6.6  NEUTROABS  --   --  5.4  HGB 9.9* 9.3* 8.8*  HCT 30.2* 28.8* 27.1*  MCV 99.3 98.3 98.5  PLT 165 155 130*    Basic Metabolic Panel: Recent Labs  Lab 05/08/19 0025 05/09/19 0442  NA 136 137  K 4.1 3.9  CL 103 106  CO2 25 23  GLUCOSE 165* 108*  BUN 20 12  CREATININE 1.29* 0.98  CALCIUM 8.2* 8.7*  MG  --  1.4*   GFR Estimated Creatinine Clearance: 68.3 mL/min (by C-G formula based on SCr of 0.98 mg/dL). Liver Function Tests: Recent Labs  Lab 05/08/19 0137  AST 30  ALT 15  ALKPHOS 29*  BILITOT 1.3*  PROT 5.0*  ALBUMIN 2.9*   No results for input(s): LIPASE, AMYLASE in the last 168 hours. No results for input(s): AMMONIA in the last 168 hours. Coagulation profile No results for input(s): INR, PROTIME in the last 168 hours.  Cardiac Enzymes: No results for input(s): CKTOTAL, CKMB, CKMBINDEX, TROPONINI in the last 168 hours. BNP: Invalid input(s): POCBNP CBG: Recent Labs  Lab 05/08/19 1107 05/08/19 1618 05/08/19 2100 05/09/19 0638 05/09/19 1126  GLUCAP 104* 126* 151* 107* 146*   D-Dimer No results for input(s): DDIMER in the last 72 hours. Hgb A1c Recent Labs    05/09/19 0442  HGBA1C 5.9*   Lipid Profile Recent Labs    05/09/19 0442  CHOL 111  HDL 46  LDLCALC 52  TRIG 63  CHOLHDL 2.4   Thyroid function studies No results for input(s): TSH, T4TOTAL, T3FREE, THYROIDAB in the last 72 hours.  Invalid input(s): FREET3 Anemia work up No results for input(s): VITAMINB12, FOLATE, FERRITIN, TIBC, IRON, RETICCTPCT in the last 72 hours. Urinalysis    Component Value Date/Time   COLORURINE YELLOW 04/29/2019 0948   APPEARANCEUR HAZY (A)  04/29/2019 0948   LABSPEC 1.028 04/29/2019 0948   PHURINE 5.0 04/29/2019 0948   GLUCOSEU >=500 (A) 04/29/2019 0948   HGBUR NEGATIVE 04/29/2019 0948   BILIRUBINUR NEGATIVE 04/29/2019 0948   KETONESUR NEGATIVE 04/29/2019 0948   PROTEINUR NEGATIVE 04/29/2019 0948   NITRITE NEGATIVE 04/29/2019 0948   LEUKOCYTESUR NEGATIVE 04/29/2019 0948     Microbiology Recent Results (from the past 240 hour(s))  SARS CORONAVIRUS 2 (TAT 6-24 HRS) Nasopharyngeal Nasopharyngeal Swab     Status: None   Collection Time: 05/04/19  6:48 AM   Specimen: Nasopharyngeal Swab  Result Value Ref Range Status   SARS Coronavirus 2 NEGATIVE NEGATIVE Final    Comment: (NOTE) SARS-CoV-2 target nucleic acids are NOT DETECTED. The SARS-CoV-2 RNA is generally detectable in upper and lower respiratory specimens during the acute phase of infection. Negative results do not preclude SARS-CoV-2  infection, do not rule out co-infections with other pathogens, and should not be used as the sole basis for treatment or other patient management decisions. Negative results must be combined with clinical observations, patient history, and epidemiological information. The expected result is Negative. Fact Sheet for Patients: SugarRoll.be Fact Sheet for Healthcare Providers: https://www.Kasean Denherder-mathews.com/ This test is not yet approved or cleared by the [place] FDA and  has been authorized for detection and/or diagnosis of SARS-CoV-2 by FDA under an Emergency Use Authorization (EUA). This EUA will remain  in effect (meaning this test can be used) for the duration of the COVID-19 declaration under Section 56 4(b)(1) of the Act, 21 U.S.C. section 360bbb-3(b)(1), unless the authorization is terminated or revoked sooner. Performed at [facility], [place] [address] [place], [place] [address]   Culture, blood (routine x 2)     Status: None (Preliminary result)   Collection Time:  05/08/19  1:37 AM   Specimen: BLOOD  Result Value Ref Range Status   Specimen Description BLOOD RIGHT ANTECUBITAL  Final   Special Requests   Final    BOTTLES DRAWN AEROBIC AND ANAEROBIC Blood Culture adequate volume   Culture   Final    NO GROWTH 1 DAY Performed at [facility], [place] [address] [place], [place] [address]    Report Status PENDING  Incomplete  Culture, blood (routine x 2)     Status: None (Preliminary result)   Collection Time: 05/08/19  1:43 AM   Specimen: BLOOD RIGHT HAND  Result Value Ref Range Status   Specimen Description BLOOD RIGHT HAND  Final   Special Requests   Final    BOTTLES DRAWN AEROBIC AND ANAEROBIC Blood Culture adequate volume   Culture   Final    NO GROWTH 1 DAY Performed at [facility], [place] [address], [place], [place] [address]    Report Status PENDING  Incomplete       Inpatient Medications:   Scheduled Meds: . aspirin EC  325 mg Oral Q breakfast  . calcium-vitamin D  1 tablet Oral BID  . docusate sodium  100 mg Oral BID  . ferrous sulfate  325 mg Oral TID PC  . gabapentin  300 mg Oral TID  . insulin aspart  0-15 Units Subcutaneous TID WC  . pantoprazole  40 mg Oral Daily  . PARoxetine  20 mg Oral [id number]  . rosuvastatin  10 mg Oral Daily   Continuous Infusions: . methocarbamol (ROBAXIN) IV       Radiological Exams on Admission: DG Chest Port 1 View  Result Date: 05/08/2019 CLINICAL DATA:  Shortness of breath. EXAM: PORTABLE CHEST 1 VIEW COMPARISON:  04/29/2019 FINDINGS: Lower lung volumes from prior exam. Unchanged heart size and mediastinal contours allowing for differences in technique. Streaky opacities in both lung bases, left greater than right. No pulmonary edema, pleural effusion or pneumothorax. Remote left rib fracture. IMPRESSION: Streaky bibasilar opacities, left greater than right, likely atelectasis. Electronically Signed   By: Keith Rake M.D.   On: 05/08/2019 01:52     Impression/Recommendations Active Problems:   Essential hypertension, benign   Surgery, elective   Hx of total hip arthroplasty, left   Hypotension   Acute blood loss anemia   Type 2 diabetes mellitus with vascular disease ([place])   1. Hypotension -presently patient is afebrile no definite signs of any sepsis.  Patient is responding to fluids.  Suspect patient's hypotension could be from dehydration and blood loss anemia  given the hemoglobin has dropped by 4 g from previous recently.  We will continue with hydration and closely monitor respiratory status and will hold off antihypertensives.  In addition I have ordered lactic acid procalcitonin levels cortisol levels chest x-ray.  Closely monitor. 2. Acute blood loss anemia likely from surgery.  Will closely monitor CBC.  Presently hemoglobin is around 9.  If there is any further decline will need blood transfusion.  We will also transfuse if patient blood pressure becomes hypotensive again. 3. CAD being medically managed.  Denies any chest pain.  EKG shows normal sinus rhythm.  Troponins negative.  Closely monitor. 4. History of hypertension presently holding antihypertensives due to hypotension. 5. Diabetes mellitus type 2 we will hold all antidiabetic oral medications and keep patient on sliding scale coverage for now. 6. Patient is mildly lethargic closely observe.  If continues to be lethargic will need to get ABG and hold all pain medications and Xanax and gabapentin. 7. Left hip surgery per orthopedics   Thank you for this consultation.  Our [facility] hospitalist team will follow the patient with you.  -Discussed case with Dr. Louanne Skye surgery will sign off Time Spent:   Allie Bossier M.D. [facility] 05/09/2019, [place] PM  [phone]

## 2019-05-09 NOTE — Plan of Care (Signed)

## 2019-05-09 NOTE — TOC Transition Note (Addendum)
Transition of Care [facility]) - CM/SW Discharge Note   Patient Details  Name: Natalie Tanner MRN: [id number] Date of Birth: 03/23/1960  Transition of Care [facility]) CM/SW Contact:  Claudie Leach, RN 05/09/2019, 1:48 PM   Clinical Narrative:    Clarified with Dr. Louanne Skye that [facility] PT/OT/RN is needed.  RN is for VS- no need for PT/INR draws.    Sharmon Revere with [facility] accepted referral and will start visits on Tuesday 3/9.  Alvis Lemmings, [facility], and [facility] declined referral. Patient staying at [facility] in [place]. Room 121.  Best number to reach her is [phone].  Patient states she thinks she has a RW and 3n1 borrowed to use.  DME RW ordered and printed to give to patient to obtain from [place] if needed.   Final next level of care: [place] Barriers to Discharge: No Barriers Identified      Discharge Plan and Services      HH Arranged: OT, PT, RN [facility] Agency: [place] Date [place]: 05/09/19 Time [place]: 1347 Representative spoke with at [place]: Sharmon Revere

## 2019-05-09 NOTE — Progress Notes (Signed)
Patient ID: Natalie Tanner, female   DOB: 12/25/1960, 59 y.o.   MRN: [id number] Doing well with PT, OT yet to see. Hbg is 8.8 and is stable, will keep on Fe. She normally is on metoprolol a beta blocker and she has some elevated heart rate. BP is better. Spoke with Dr. Sherral Hammers and he  Is in agreement with restarting betablocker at half dose 12.5 mg BID for now. I will enter these orders and allow her to be discharged with [facility] for follow up to watch her VSS blood pressures, anemia and  For PT.

## 2019-05-09 NOTE — Progress Notes (Signed)
Provided discharge education/instructions, all questions and concerns addressed, Pt not in distress, discharged home with belongings accompanied by husband. 

## 2019-05-09 NOTE — Progress Notes (Signed)
Occupational Therapy Treatment Patient Details Name: Natalie Tanner MRN: [id number] DOB: June 10, 1960 Today's Date: 05/09/2019    History of present illness Mrs. Kasey is a 59 yo female who  has presented [facility] for a L total hip arthoplasty (direct anterior) with a PMH signitificant for coronary atherosclerosis,    OT comments  Pt progressing towards OT goals this session. Session focused on tub transfer with 3 in 1 and RW (handout provided in addition to min A to perform) husband present and verbalized ability to assist. AE education for gait belt (leg lifter), long handle shoe horn, sock donner, grabber/reacher, and long handle sponge for LB ADL. Pt and husband verbalized understanding. Pt min guard for transfers today but in significant pain from recent PT session. Ice provided, pain medication given during session (after BP taken). NO further questions or concerns at this time for OT. Current POC remains appropriate.    Follow Up Recommendations  Supervision - Intermittent;Home health OT    Equipment Recommendations  3 in 1 bedside commode    Recommendations for Other Services      Precautions / Restrictions Precautions Precautions: Anterior Hip Restrictions Weight Bearing Restrictions: Yes LLE Weight Bearing: Weight bearing as tolerated       Mobility Bed Mobility Overal bed mobility: Needs Assistance Bed Mobility: Supine to Sit     Supine to sit: Min assist     General bed mobility comments: with management of LLE to bring to EOB. Pt demonstrates good UE strength to elevate trunk with bed rail  Transfers Overall transfer level: Needs assistance Equipment used: Rolling walker (2 wheeled) Transfers: Sit to/from Stand Sit to Stand: Min guard         General transfer comment: Min gaurd for intial standing, supervision for pivot transfers    Balance Overall balance assessment: Needs assistance Sitting-balance support: Single extremity supported Sitting balance-Leahy  Scale: Fair     Standing balance support: Bilateral upper extremity supported Standing balance-Leahy Scale: Poor Standing balance comment: reliant of RW                           ADL either performed or assessed with clinical judgement   ADL Overall ADL's : Needs assistance/impaired             Lower Body Bathing: Set up;Sit to/from stand;With adaptive equipment Lower Body Bathing Details (indicate cue type and reason): educated in long handle sponge     Lower Body Dressing: Minimal assistance;With adaptive equipment;Sit to/from stand Lower Body Dressing Details (indicate cue type and reason): educated on long handle shoe horn, sock donner, and grabber/reacher with return demo (used pillow case for "pant legs" Toilet Transfer: Min guard;Ambulation;RW       Tub/ Shower Transfer: Tub transfer;Minimal assistance;3 in Mudlogger Details (indicate cue type and reason): Pt provided with handout for stepping backwards into tub, physically performed with patient as well with husband present and verbalizing understanding    General ADL Comments: fully educated with return demo for AE education and tub transfer using RW and 3 in 1     Vision       Perception     Praxis      Cognition Arousal/Alertness: Awake/alert Behavior During Therapy: WFL for tasks assessed/performed Overall Cognitive Status: Within Functional Limits for tasks assessed  Exercises Exercises: Total Joint Total Joint Exercises Quad Sets: AROM;Both;10 reps;Supine Gluteal Sets: AROM;Both;10 reps;Supine Short Arc Quad: AROM;Left;5 reps;Supine Heel Slides: AROM;Left;5 reps;Supine Straight Leg Raises: AROM;Left;5 reps;Supine   Shoulder Instructions       General Comments BP/pulse recorded in chair prior to getting pain medication WFL    Pertinent Vitals/ Pain       Pain Assessment: Faces Pain Score: 2   Faces Pain Scale: Hurts whole lot Pain Location: L LE Pain Descriptors / Indicators: Aching;Discomfort;Grimacing;Sore;Tender Pain Intervention(s): Limited activity within patient's tolerance;Monitored during session;Repositioned;Ice applied;RN gave pain meds during session  Home Living                                          Prior Functioning/Environment              Frequency  Min 2X/week        Progress Toward Goals  OT Goals(current goals can now be found in the care plan section)  Progress towards OT goals: Progressing toward goals  Acute Rehab OT Goals Patient Stated Goal: improve pain and mobility OT Goal Formulation: With patient Time For Goal Achievement: 05/22/19 Potential to Achieve Goals: Good  Plan Discharge plan remains appropriate    Co-evaluation                 AM-PAC OT "6 Clicks" Daily Activity     Outcome Measure   Help from another person eating meals?: None Help from another person taking care of personal grooming?: A Little Help from another person toileting, which includes using toliet, bedpan, or urinal?: A Little Help from another person bathing (including washing, rinsing, drying)?: A Little Help from another person to put on and taking off regular upper body clothing?: None Help from another person to put on and taking off regular lower body clothing?: A Lot 6 Click Score: 19    End of Session Equipment Utilized During Treatment: Gait belt;Rolling walker;Other (comment)(AE for LB ADL)  OT Visit Diagnosis: Unsteadiness on feet (R26.81);Pain Pain - Right/Left: Left Pain - part of body: Hip   Activity Tolerance Patient tolerated treatment well   Patient Left in chair;with call bell/phone within reach;with family/visitor present   Nurse Communication Mobility status;Weight bearing status        Time: [id number] OT Time Calculation (min): 32 min  Charges: OT General Charges $OT Visit: 1 Visit OT  Treatments $Self Care/Home Management : 23-37 mins  Jesse Sans OTR/L Acute Rehabilitation Services Pager: [phone] Office: [place] 05/09/2019, 2:18 PM

## 2019-05-09 NOTE — Discharge Instructions (Addendum)
    Keep hip incision dry for 3 days post op then may wet while bathing. Therapy daily . Call if fever or chills or increased drainage. Go to ER if acutely short of breath or call for ambulance. Return for follow up in 2 weeks. May full weight bear on the surgical leg unless told otherwise. In house walking for first 2 weeks.  Take iron (ferrous gluconate) twice a day for 4 weeks. Hold no taking your antihypertension meds until your blood pressure begins to return to hypertension SBP> 140 and/or DBP>90 Please call your primary care physician to have your medications adjusted and a reconsideration of use of some antihyperglycemic meds Due to risk to cardiovascular system.

## 2019-05-09 NOTE — Progress Notes (Signed)
Physical Therapy Treatment Patient Details Name: Natalie Tanner MRN: [id number] DOB: Jan 31, 1961 Today's Date: 05/09/2019    History of Present Illness Mrs. Swartzlander is a 59 yo female who  has presented [facility] for a L total hip arthoplasty (direct anterior) with a PMH signitificant for coronary atherosclerosis,     PT Comments    Tx focused on stair training, gait training/sequencing, and there ex/HEP.  At the start of tx her HR was around 107, with O2 sats remaining in mid to high 90s during tx. Her HR was around 118 post tx after ambulation and stairs. BP was [facility] 122/75. MD notified of HR during ambulation and prior currently. Pt demonstrated much improved ambulation today, being able to ambulate 150 feet with no seated rest breaks, performing stairs in between with no near LOB indicated. Pt's husband participated with steadying RW with instruction from therapist for patient safety with backwards ambulation up stairs without HR assistance. Pt and husband demonstrated understanding requiring intermittent VCs, with patient being able to direct husband as needed for second half. Pt could continue to benefit from skilled therapy to progress HEP and functional mobility at home.  Pt able to ambulate 2 stairs required to return home.    Follow Up Recommendations  Follow surgeon's recommendation for DC plan and follow-up therapies;Home health PT;Supervision for mobility/OOB     Equipment Recommendations  Rolling walker with 5" wheels;3in1 (PT)    Recommendations for Other Services       Precautions / Restrictions Restrictions Weight Bearing Restrictions: Yes LLE Weight Bearing: Weight bearing as tolerated    Mobility  Bed Mobility Overal bed mobility: Needs Assistance Bed Mobility: Supine to Sit     Supine to sit: Min assist     General bed mobility comments: with management of LLE to bring to EOB. Pt demonstrates good UE strength to elevate trunk with bed rail  Transfers Overall transfer  level: Needs assistance Equipment used: Rolling walker (2 wheeled) Transfers: Sit to/from Stand Sit to Stand: Min guard;Supervision         General transfer comment: Min gaurd for intial standing, supervision for pivot transfers  Ambulation/Gait Ambulation/Gait assistance: Min guard;Supervision Gait Distance (Feet): 150 Feet Assistive device: Rolling walker (2 wheeled) Gait Pattern/deviations: Step-to pattern;Decreased step length - right;Decreased stance time - left;Antalgic Gait velocity: decreased   General Gait Details: Required use of RW; cued for step length, heel strike and increased quad activation   Stairs Stairs: Yes Stairs assistance: Min assist(Husband helped steady walker for stairs without railing) Stair Management: Backwards;With walker Number of Stairs: 2 General stair comments: pt required multiple cues to ambulate up the stairs safely, given handout to patient and family stating understanding   Wheelchair Mobility    Modified Rankin (Stroke Patients Only)       Balance Overall balance assessment: Needs assistance Sitting-balance support: Single extremity supported Sitting balance-Leahy Scale: Fair     Standing balance support: Bilateral upper extremity supported Standing balance-Leahy Scale: Poor Standing balance comment: reliant of RW      Cognition Arousal/Alertness: Awake/alert Behavior During Therapy: WFL for tasks assessed/performed Overall Cognitive Status: Within Functional Limits for tasks assessed        Exercises Total Joint Exercises Quad Sets: AROM;Both;10 reps;Supine Gluteal Sets: AROM;Both;10 reps;Supine Short Arc Quad: AROM;Left;5 reps;Supine Heel Slides: AROM;Left;5 reps;Supine Straight Leg Raises: AROM;Left;5 reps;Supine    General Comments        Pertinent Vitals/Pain Pain Assessment: 0-10 Pain Score: 2  Pain Location: L LE Pain Descriptors /  Indicators: Aching;Discomfort           PT Goals (current goals  can now be found in the care plan section) Acute Rehab PT Goals Patient Stated Goal: improve pain and mobility PT Goal Formulation: With patient Time For Goal Achievement: 05/22/19 Potential to Achieve Goals: Good Progress towards PT goals: Progressing toward goals    Frequency    7X/week      PT Plan Current plan remains appropriate       AM-PAC PT "6 Clicks" Mobility   Outcome Measure  Help needed turning from your back to your side while in a flat bed without using bedrails?: A Little Help needed moving from lying on your back to sitting on the side of a flat bed without using bedrails?: A Little Help needed moving to and from a bed to a chair (including a wheelchair)?: A Little Help needed standing up from a chair using your arms (e.g., wheelchair or bedside chair)?: A Little Help needed to walk in hospital room?: A Little Help needed climbing 3-5 steps with a railing? : A Lot 6 Click Score: 17    End of Session Equipment Utilized During Treatment: Gait belt Activity Tolerance: Patient tolerated treatment well;Patient limited by fatigue Patient left: in bed;with nursing/sitter in room;with family/visitor present;with call bell/phone within reach Nurse Communication: Mobility status PT Visit Diagnosis: Unsteadiness on feet (R26.81);Other abnormalities of gait and mobility (R26.89);Muscle weakness (generalized) (M62.81);Pain Pain - Right/Left: Left Pain - part of body: Hip     Time: 1206-1238 PT Time Calculation (min) (ACUTE ONLY): 32 min  Charges:  $Gait Training: 8-22 mins $Therapeutic Exercise: 8-22 mins                     Ann Held PT, DPT Acute [place] P: Door 05/09/2019, 12:46 PM

## 2019-05-12 ENCOUNTER — Encounter: Payer: Self-pay | Admitting: *Deleted

## 2019-05-13 ENCOUNTER — Telehealth: Payer: Self-pay | Admitting: Orthopaedic Surgery

## 2019-05-13 LAB — CULTURE, BLOOD (ROUTINE X 2)
Culture: NO GROWTH
Culture: NO GROWTH
Special Requests: ADEQUATE
Special Requests: ADEQUATE

## 2019-05-13 NOTE — Telephone Encounter (Signed)
I spoke with Natalie Tanner and advised, leave bandage on until first post op appointment in the office.

## 2019-05-13 NOTE — Telephone Encounter (Signed)
Charyl from [employer] called.   She needs to know if the patient's dressing should be changed.  Call back: [phone]

## 2019-05-13 NOTE — Telephone Encounter (Signed)
I left voicemail on number listed to return my call in [place] office.

## 2019-05-20 ENCOUNTER — Other Ambulatory Visit: Payer: Self-pay

## 2019-05-20 ENCOUNTER — Ambulatory Visit (INDEPENDENT_AMBULATORY_CARE_PROVIDER_SITE_OTHER)

## 2019-05-20 ENCOUNTER — Encounter: Payer: Self-pay | Admitting: Orthopaedic Surgery

## 2019-05-20 ENCOUNTER — Ambulatory Visit (INDEPENDENT_AMBULATORY_CARE_PROVIDER_SITE_OTHER): Admitting: Orthopaedic Surgery

## 2019-05-20 VITALS — Ht 63.0 in | Wt 203.0 lb

## 2019-05-20 DIAGNOSIS — Z96642 Presence of left artificial hip joint: Secondary | ICD-10-CM

## 2019-05-20 NOTE — Progress Notes (Signed)
Post-Op Visit Note   Patient: Natalie Tanner           Date of Birth: 1960/04/30           MRN: [id number] Visit Date: 05/20/2019 PCP: Terald Sleeper, PA-C   Assessment & Plan: Post left total hip arthroplasty leg lengths are equal staples removed she is on a walker getting pain instructions with home health physical therapy starting tomorrow.  She is making good progress.  Still has some aching in the thighs expected.  Recheck 4 weeks.  Chief Complaint:  Chief Complaint  Patient presents with  . Left Hip - Routine Post Op    05/07/2019 Left THA   Visit Diagnoses:  1. Status post total hip replacement, left   2. Hx of total hip arthroplasty, left     Plan: Continue walking progress to cane recheck 4 weeks.  Follow-Up Instructions: Return in about 1 week (around 05/27/2019).   Orders:  Orders Placed This Encounter  Procedures  . XR HIP UNILAT W OR W/O PELVIS 2-3 VIEWS LEFT   No orders of the defined types were placed in this encounter.   Imaging: XR HIP UNILAT W OR W/O PELVIS 2-3 VIEWS LEFT  Result Date: 05/20/2019 Standing AP both hips lateral left hip x-ray obtained and reviewed.  This shows well-positioned left total hip arthroplasty.  No loosening or subsidence. Impression: Satisfactory left total hip arthroplasty leg lengths are equal by radiograph.   PMFS History: Patient Active Problem List   Diagnosis Date Noted  . Hypotension 05/08/2019  . Acute blood loss anemia 05/08/2019  . Type 2 diabetes mellitus with vascular disease ([place]) 05/08/2019  . Hx of total hip arthroplasty, left 05/07/2019  . Surgery, elective   . Plantar fasciitis, right 09/16/2018  . Achilles tendon disorder, right 09/16/2018  . Concussion with loss of consciousness 11/17/2017  . Other complicated headache syndrome 11/17/2017  . Osteoarthritis of finger 03/14/2017  . Trigger finger of left thumb 11/27/2016  . Calcification of tendon 11/27/2016  . Arthralgia of both hands 08/26/2016  .  Labyrinthitis of left ear 02/21/2016  . Inner ear disease, left 02/21/2016  . Bilateral hearing loss 06/26/2015  . 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 Coronary atherosclerosis of native coronary artery 08/13/2010  . Diabetes ([place]) 08/13/2010  . Mixed hyperlipidemia 08/13/2010  . Essential hypertension, benign 08/13/2010  . Iron deficiency anemia 08/13/2010   Past Medical History:  Diagnosis Date  . Anginal pain ([place])   . Anxiety   . Coronary atherosclerosis of native coronary artery    Managed medically following cardiac catheterization 5/12  . Depression   . Essential hypertension, benign   . GERD (gastroesophageal reflux disease)   . History of kidney stones   . Iron deficiency anemia    Secondary to menometrorrhagia  . Mixed hyperlipidemia   . Myocardial infarction [facility])    NSTEMI 5/12  . Nephrolithiasis   . Stroke ([place]) 2017  . Type 2 diabetes mellitus (HCC)     Family History  Problem Relation Age of Onset  . Other Father        Clostridium difficile  . Stroke Father     Past Surgical History:  Procedure Laterality Date  . BACK SURGERY    . KNEE SURGERY    . SINUS SURGERY WITH INSTATRAK    . TOTAL HIP ARTHROPLASTY Left 05/07/2019   Procedure: LEFT TOTAL HIP ARTHROPLASTY DIRECT ANTERIOR;  Surgeon: Marybelle Killings, MD;  Location: [place];  Service: Orthopedics;  Laterality: Left;   Social History   Occupational History  . Occupation: [employer]  Tobacco Use  . Smoking status: Former Smoker    Packs/day: 1.00    Years: 20.00    Pack years: 20.00    Types: Cigarettes    Quit date: 03/04/2001    Years since quitting: 18.2  . Smokeless tobacco: Never Used  Substance and Sexual Activity  . Alcohol use: No  . Drug use: No  . Sexual activity: Yes

## 2019-05-25 ENCOUNTER — Ambulatory Visit: Admitting: Cardiology

## 2019-05-25 NOTE — Discharge Summary (Signed)
Patient ID: Natalie Tanner MRN: [id number] DOB/AGE: 1960-06-20 59 y.o.  Admit date: 05/07/2019 Discharge date: 05/25/2019  Admission Diagnoses:  Active Problems:   Essential hypertension, benign   Surgery, elective   Hx of total hip arthroplasty, left   Hypotension   Acute blood loss anemia   Type 2 diabetes mellitus with vascular disease ([place])   Discharge Diagnoses:  Active Problems:   Essential hypertension, benign   Surgery, elective   Hx of total hip arthroplasty, left   Hypotension   Acute blood loss anemia   Type 2 diabetes mellitus with vascular disease ([place])  status post Procedure(s): LEFT TOTAL HIP ARTHROPLASTY DIRECT ANTERIOR  Past Medical History:  Diagnosis Date  . Anginal pain ([place])   . Anxiety   . Coronary atherosclerosis of native coronary artery    Managed medically following cardiac catheterization 5/12  . Depression   . Essential hypertension, benign   . GERD (gastroesophageal reflux disease)   . History of kidney stones   . Iron deficiency anemia    Secondary to menometrorrhagia  . Mixed hyperlipidemia   . Myocardial infarction [facility])    NSTEMI 5/12  . Nephrolithiasis   . Stroke ([place]) 2017  . Type 2 diabetes mellitus ([place])     Surgeries: Procedure(s): LEFT TOTAL HIP ARTHROPLASTY DIRECT ANTERIOR on 05/07/2019   Consultants: Treatment Team:  Marcheta Grammes, MD  Discharged Condition: Improved  Hospital Course: Natalie Tanner is an 59 y.o. female who was admitted 05/07/2019 for operative treatment of left hip arthritis. Patient failed conservative treatments (please see the history and physical for the specifics) and had severe unremitting pain that affects sleep, daily activities and work/hobbies. After pre-op clearance, the patient was taken to the operating room on 05/07/2019 and underwent  Procedure(s): LEFT TOTAL HIP ARTHROPLASTY DIRECT ANTERIOR.    Patient was given perioperative antibiotics:  Anti-infectives (From admission, onward)   Start      Dose/Rate Route Frequency Ordered Stop   05/07/19 0801  ceFAZolin (ANCEF) 2-4 GM/100ML-% IVPB    Note to Pharmacy: Jasmine Pang   : cabinet override      05/07/19 0801 05/07/19 1101   05/07/19 0800  ceFAZolin (ANCEF) IVPB 2g/100 mL premix     2 g 200 mL/hr over 30 Minutes Intravenous On call to O.R. 05/07/19 0759 05/07/19 1113       Patient was given sequential compression devices and early ambulation to prevent DVT.   Patient benefited maximally from hospital stay and there were no complications. At the time of discharge, the patient was urinating/moving their bowels without difficulty, tolerating a regular diet, pain is controlled with oral pain medications and they have been cleared by PT/OT.   Recent vital signs: No data found.   Recent laboratory studies: No results for input(s): WBC, HGB, HCT, PLT, NA, K, CL, CO2, BUN, CREATININE, GLUCOSE, INR, CALCIUM in the last 72 hours.  Invalid input(s): PT, 2   Discharge Medications:   Allergies as of 05/09/2019   No Known Allergies     Medication List    STOP taking these medications   isosorbide mononitrate 30 MG 24 hr tablet Commonly known as: IMDUR     TAKE these medications   ALPRAZolam 1 MG tablet Commonly known as: XANAX Take 1 tablet (1 mg total) by mouth 2 (two) times daily as needed. Notes to patient: Resume home regimen   aspirin 325 MG EC tablet Take 1 tablet (325 mg total) by mouth daily with breakfast.   blood glucose  meter kit and supplies Kit Dispense based on patient and insurance preference. Use up to four times daily as directed. For diabetes E11.9, with hyperglycemia.   docusate sodium 100 MG capsule Commonly known as: COLACE Take 1 capsule (100 mg total) by mouth 2 (two) times daily.   ferrous sulfate 325 (65 FE) MG tablet Take 1 tablet (325 mg total) by mouth 3 (three) times daily after meals.   gabapentin 300 MG capsule Commonly known as: NEURONTIN Take 1 capsule (300 mg total) by mouth 3  (three) times daily.   glipiZIDE 10 MG tablet Commonly known as: GLUCOTROL Take 1 tablet (10 mg total) by mouth daily. Notes to patient: Resume home regimen   Jardiance 25 MG Tabs tablet Generic drug: empagliflozin Take 25 mg by mouth daily. Notes to patient: Resume home regimen   lisinopril 10 MG tablet Commonly known as: ZESTRIL Take 1 tablet (10 mg total) by mouth daily. Notes to patient: Resume home regimen   meclizine 25 MG tablet Commonly known as: ANTIVERT Take 1 tablet (25 mg total) by mouth 3 (three) times daily as needed for dizziness. Notes to patient: Resume home regimen   methocarbamol 500 MG tablet Commonly known as: ROBAXIN Take 1 tablet (500 mg total) by mouth every 8 (eight) hours as needed for muscle spasms.   metoprolol tartrate 25 MG tablet Commonly known as: LOPRESSOR Take 0.5 tablets (12.5 mg total) by mouth 2 (two) times daily. What changed: how much to take   nitroGLYCERIN 0.4 MG SL tablet Commonly known as: Nitrostat Place 1 tablet (0.4 mg total) under the tongue every 5 (five) minutes as needed for chest pain. Notes to patient: Resume home regimen   omeprazole 20 MG tablet Commonly known as: PRILOSEC OTC Take 1 tablet (20 mg total) by mouth daily. Notes to patient: Resume home regimen   oxyCODONE 5 MG immediate release tablet Commonly known as: Oxy IR/ROXICODONE Take 1-2 tablets (5-10 mg total) by mouth every 4 (four) hours as needed for moderate pain (pain score 4-6).   PARoxetine 20 MG tablet Commonly known as: PAXIL Take 1 tablet (20 mg total) by mouth every morning.   pioglitazone 30 MG tablet Commonly known as: Actos Take 1 tablet (30 mg total) by mouth daily. Notes to patient: Resume home regimen   rosuvastatin 10 MG tablet Commonly known as: CRESTOR Take 1 tablet (10 mg total) by mouth daily.   sitaGLIPtin 100 MG tablet Commonly known as: JANUVIA Take 1 tablet (100 mg total) by mouth daily. Notes to patient: Resume home  regimen       Diagnostic Studies: DG Chest 2 View  Result Date: 04/29/2019 CLINICAL DATA:  Preoperative study for hip replacement. EXAM: CHEST - 2 VIEW COMPARISON:  None. FINDINGS: The heart size and mediastinal contours are within normal limits. Atherosclerotic calcification of the aortic arch. Normal pulmonary vascularity. No focal consolidation, pleural effusion, or pneumothorax. No acute osseous abnormality. IMPRESSION: No active cardiopulmonary disease. Electronically Signed   By: Titus Dubin M.D.   On: 04/29/2019 14:52   DG Chest Port 1 View  Result Date: 05/08/2019 CLINICAL DATA:  Shortness of breath. EXAM: PORTABLE CHEST 1 VIEW COMPARISON:  04/29/2019 FINDINGS: Lower lung volumes from prior exam. Unchanged heart size and mediastinal contours allowing for differences in technique. Streaky opacities in both lung bases, left greater than right. No pulmonary edema, pleural effusion or pneumothorax. Remote left rib fracture. IMPRESSION: Streaky bibasilar opacities, left greater than right, likely atelectasis. Electronically Signed   By: Threasa Beards  [place] M.D.   On: 05/08/2019 01:52   DG C-Arm 1-60 Min  Result Date: 05/07/2019 CLINICAL DATA:  Left hip arthroplasty EXAM: OPERATIVE LEFT HIP (WITH PELVIS IF PERFORMED) AP VIEWS TECHNIQUE: Fluoroscopic spot image(s) were submitted for interpretation post-operatively. COMPARISON:  01/20/2019 FINDINGS: 4 C-arm fluoroscopic images were obtained intraoperatively and submitted for post operative interpretation. Images demonstrate placement of left total hip arthroplasty hardware in proper alignment without periprosthetic fracture. 22 seconds of fluoroscopy time was utilized. Please see the performing provider's procedural report for further detail. IMPRESSION: As above. Electronically Signed   By: Davina Poke D.O.   On: 05/07/2019 13:27   DG HIP OPERATIVE UNILAT W OR W/O PELVIS LEFT  Result Date: 05/07/2019 CLINICAL DATA:  Left hip arthroplasty EXAM:  OPERATIVE LEFT HIP (WITH PELVIS IF PERFORMED) AP VIEWS TECHNIQUE: Fluoroscopic spot image(s) were submitted for interpretation post-operatively. COMPARISON:  01/20/2019 FINDINGS: 4 C-arm fluoroscopic images were obtained intraoperatively and submitted for post operative interpretation. Images demonstrate placement of left total hip arthroplasty hardware in proper alignment without periprosthetic fracture. 22 seconds of fluoroscopy time was utilized. Please see the performing provider's procedural report for further detail. IMPRESSION: As above. Electronically Signed   By: Davina Poke D.O.   On: 05/07/2019 13:27   XR HIP UNILAT W OR W/O PELVIS 2-3 VIEWS LEFT  Result Date: 05/20/2019 Standing AP both hips lateral left hip x-ray obtained and reviewed.  This shows well-positioned left total hip arthroplasty.  No loosening or subsidence. Impression: Satisfactory left total hip arthroplasty leg lengths are equal by radiograph.   Discharge Instructions    Call MD / Call 911   Complete by: As directed    If you experience chest pain or shortness of breath, CALL 911 and be transported to the hospital emergency room.  If you develope a fever above 101 F, pus (white drainage) or increased drainage or redness at the wound, or calf pain, call your surgeon's office.   Constipation Prevention   Complete by: As directed    Drink plenty of fluids.  Prune juice may be helpful.  You may use a stool softener, such as Colace (over the counter) 100 mg twice a day.  Use MiraLax (over the counter) for constipation as needed.   Diet - low sodium heart healthy   Complete by: As directed    Discharge instructions   Complete by: As directed    Keep hip incision dry for 3 days post op then may wet while bathing. Therapy daily . Call if fever or chills or increased drainage. Go to ER if acutely short of breath or call for ambulance. Return for follow up in 2 weeks. May full weight bear on the surgical leg unless told  otherwise. In house walking for first 2 weeks.  Take iron (ferrous gluconate) twice a day for 4 weeks. Hold no taking your antihypertension meds until your blood pressure begins to return to hypertension SBP> 140 and/or DBP>90 Please call your primary care physician to have your medications adjusted and a reconsideration of use of some antihyperglycemic meds Due to risk to cardiovascular system.   Driving restrictions   Complete by: As directed    No driving for 4 weeks   Increase activity slowly as tolerated   Complete by: As directed    Lifting restrictions   Complete by: As directed    No lifting for 6 weeks      Follow-up Information    Marybelle Killings, MD Follow up in 2  week(s).   Specialty: Orthopedic Surgery Contact information: [place] [address] [phone]        Terald Sleeper, PA-C Follow up in 2 week(s).   Specialties: Physician Assistant, Family Medicine Why: review antihypertensive meds as she was showing hypotension in hospital, also to assess risk of use of tradjenta and invokana in light of severe CAD. Contact information: [address] [place] [address] [phone]        Care, [facility] Follow up.   Why: Physical, occupational therapy and nurse will visit from this agency.  Visits will start Tuesday.  Contact information: [place] [address] [phone]           Discharge Plan:  discharge to home  Disposition:     Signed: Benjiman Core  05/25/2019, 9:29 AM

## 2019-06-02 ENCOUNTER — Other Ambulatory Visit: Payer: Self-pay | Admitting: Specialist

## 2019-06-16 ENCOUNTER — Encounter: Payer: Self-pay | Admitting: *Deleted

## 2019-06-17 ENCOUNTER — Other Ambulatory Visit: Payer: Self-pay

## 2019-06-17 ENCOUNTER — Ambulatory Visit (INDEPENDENT_AMBULATORY_CARE_PROVIDER_SITE_OTHER): Admitting: Orthopaedic Surgery

## 2019-06-17 ENCOUNTER — Encounter: Payer: Self-pay | Admitting: Orthopaedic Surgery

## 2019-06-17 VITALS — BP 143/97 | HR 76 | Ht 63.0 in | Wt 203.0 lb

## 2019-06-17 DIAGNOSIS — Z96642 Presence of left artificial hip joint: Secondary | ICD-10-CM

## 2019-06-17 NOTE — Progress Notes (Signed)
   Post-Op Visit Note   Patient: Natalie Tanner           Date of Birth: 1960-03-14           MRN: [id number] Visit Date: 06/17/2019 PCP: Terald Sleeper, PA-C   Assessment & Plan:post left THA  Chief Complaint:  Chief Complaint  Patient presents with  . Left Hip - Follow-up    05/07/2019 Left THA   Visit Diagnoses: S/P  Left THA  Plan: continue ambulation with cane with increase in distance . ROV 4 wks.  Follow-Up Instructions: Return in about 4 weeks (around 07/15/2019).   Orders:  No orders of the defined types were placed in this encounter.  No orders of the defined types were placed in this encounter.   Imaging: No results found.  PMFS History: Patient Active Problem List   Diagnosis Date Noted  . Hypotension 05/08/2019  . Acute blood loss anemia 05/08/2019  . Type 2 diabetes mellitus with vascular disease ([place]) 05/08/2019  . Hx of total hip arthroplasty, left 05/07/2019  . Plantar fasciitis, right 09/16/2018  . Achilles tendon disorder, right 09/16/2018  . Concussion with loss of consciousness 11/17/2017  . Other complicated headache syndrome 11/17/2017  . Osteoarthritis of finger 03/14/2017  . Trigger finger of left thumb 11/27/2016  . Calcification of tendon 11/27/2016  . Arthralgia of both hands 08/26/2016  . Labyrinthitis of left ear 02/21/2016  . Inner ear disease, left 02/21/2016  . Bilateral hearing loss 06/26/2015  . 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 Coronary atherosclerosis of native coronary artery 08/13/2010  . Diabetes ([place]) 08/13/2010  . Mixed hyperlipidemia 08/13/2010  . Essential hypertension, benign 08/13/2010  . Iron deficiency anemia 08/13/2010   Past Medical History:  Diagnosis Date  . Anginal pain ([place])   . Anxiety   . Coronary atherosclerosis of native coronary artery    Managed medically following cardiac catheterization 5/12  . Depression   . Essential hypertension, benign   . GERD (gastroesophageal reflux disease)   . History of  kidney stones   . Iron deficiency anemia    Secondary to menometrorrhagia  . Mixed hyperlipidemia   . Myocardial infarction [facility])    NSTEMI 5/12  . Nephrolithiasis   . Stroke ([place]) 2017  . Type 2 diabetes mellitus (HCC)     Family History  Problem Relation Age of Onset  . Other Father        Clostridium difficile  . Stroke Father     Past Surgical History:  Procedure Laterality Date  . BACK SURGERY    . KNEE SURGERY    . SINUS SURGERY WITH INSTATRAK    . TOTAL HIP ARTHROPLASTY Left 05/07/2019   Procedure: LEFT TOTAL HIP ARTHROPLASTY DIRECT ANTERIOR;  Surgeon: Marybelle Killings, MD;  Location: [place];  Service: Orthopedics;  Laterality: Left;   Social History   Occupational History  . Occupation: [employer]  Tobacco Use  . Smoking status: Former Smoker    Packs/day: 1.00    Years: 20.00    Pack years: 20.00    Types: Cigarettes    Quit date: 03/04/2001    Years since quitting: 18.2  . Smokeless tobacco: Never Used  Substance and Sexual Activity  . Alcohol use: No  . Drug use: No  . Sexual activity: Yes

## 2019-07-14 ENCOUNTER — Other Ambulatory Visit: Payer: Self-pay

## 2019-07-14 ENCOUNTER — Ambulatory Visit (INDEPENDENT_AMBULATORY_CARE_PROVIDER_SITE_OTHER): Admitting: Family Medicine

## 2019-07-14 ENCOUNTER — Ambulatory Visit: Admitting: Physician Assistant

## 2019-07-14 ENCOUNTER — Encounter: Payer: Self-pay | Admitting: Family Medicine

## 2019-07-14 VITALS — BP 152/90 | HR 117 | Temp 98.3°F | Resp 20 | Ht 63.0 in | Wt 212.4 lb

## 2019-07-14 DIAGNOSIS — M159 Polyosteoarthritis, unspecified: Secondary | ICD-10-CM

## 2019-07-14 DIAGNOSIS — Z79899 Other long term (current) drug therapy: Secondary | ICD-10-CM

## 2019-07-14 DIAGNOSIS — Z114 Encounter for screening for human immunodeficiency virus [HIV]: Secondary | ICD-10-CM

## 2019-07-14 DIAGNOSIS — M8949 Other hypertrophic osteoarthropathy, multiple sites: Secondary | ICD-10-CM

## 2019-07-14 DIAGNOSIS — E1165 Type 2 diabetes mellitus with hyperglycemia: Secondary | ICD-10-CM | POA: Diagnosis not present

## 2019-07-14 DIAGNOSIS — I10 Essential (primary) hypertension: Secondary | ICD-10-CM

## 2019-07-14 DIAGNOSIS — E782 Mixed hyperlipidemia: Secondary | ICD-10-CM

## 2019-07-14 DIAGNOSIS — Z1159 Encounter for screening for other viral diseases: Secondary | ICD-10-CM

## 2019-07-14 LAB — BAYER DCA HB A1C WAIVED: HB A1C (BAYER DCA - WAIVED): 5.4 % (ref <7.0)

## 2019-07-14 MED ORDER — CELECOXIB 200 MG PO CAPS: 200.0000 mg | ORAL_CAPSULE | Freq: Every day | ORAL | 5 refills | Status: DC

## 2019-07-14 MED ORDER — LISINOPRIL 20 MG PO TABS: 20.0000 mg | ORAL_TABLET | Freq: Every day | ORAL | 1 refills | Status: DC

## 2019-07-14 NOTE — Progress Notes (Signed)
 Subjective:  Patient ID: Natalie Tanner,  female    DOB: 09/15/1960  Age: 59 y.o.    CC: Medical Management of Chronic Issues   HPI Seraya Mackiewicz presents for  follow-up of hypertension. Patient has no history of headache chest pain or shortness of breath or recent cough. Patient also denies symptoms of TIA such as numbness weakness lateralizing. Patient denies side effects from medication. States taking it regularly.  Patient also  in for follow-up of elevated cholesterol. Doing well without complaints on current medication. Denies side effects  including myalgia and arthralgia and nausea. Also in today for liver function testing. Currently no chest pain, shortness of breath or other cardiovascular related symptoms noted.  Follow-up of diabetes. Patient does not check her blood sugar regularly at home.  Patient denies symptoms such as excessive hunger or urinary frequency, excessive hunger, nausea No significant hypoglycemic spells noted. Medications reviewed. Pt reports taking them regularly. Pt. denies complication/adverse reaction today.   Multiple arthralgias affect hands, back, knees and hips. Needs relief to function.   History Tonianne has a past medical history of Anginal pain (HCC), Anxiety, Coronary atherosclerosis of native coronary artery, Depression, Essential hypertension, benign, GERD (gastroesophageal reflux disease), History of kidney stones, Iron deficiency anemia, Mixed hyperlipidemia, Myocardial infarction (HCC), Nephrolithiasis, Stroke (HCC) (2017), and Type 2 diabetes mellitus (HCC).   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   Her family history includes Other in her father; Stroke in her father.She reports that she quit smoking about 18 years ago. Her smoking use included cigarettes. She has a 20.00 pack-year smoking history. She has never used smokeless  tobacco. She reports that she does not drink alcohol or use drugs.  Current Outpatient Medications on File Prior to Visit  Medication Sig Dispense Refill  . ALPRAZolam (XANAX) 1 MG tablet Take 1 tablet (1 mg total) by mouth 2 (two) times daily as needed. 180 tablet 1  . aspirin EC 325 MG EC tablet Take 1 tablet (325 mg total) by mouth daily with breakfast. 30 tablet 0  . blood glucose meter kit and supplies KIT Dispense based on patient and insurance preference. Use up to four times daily as directed. For diabetes E11.9, with hyperglycemia. 90 each 11  . empagliflozin (JARDIANCE) 25 MG TABS tablet Take 25 mg by mouth daily. 90 tablet 3  . ferrous sulfate 325 (65 FE) MG tablet Take 1 tablet (325 mg total) by mouth 3 (three) times daily after meals. 90 tablet 1  . glipiZIDE (GLUCOTROL) 10 MG tablet Take 1 tablet (10 mg total) by mouth daily. 90 tablet 3  . meclizine (ANTIVERT) 25 MG tablet Take 1 tablet (25 mg total) by mouth 3 (three) times daily as needed for dizziness. 90 tablet 11  . methocarbamol (ROBAXIN) 500 MG tablet TAKE ONE TABLET BY MOUTH EVERY 8 HOURS AS NEEDED FOR MUSCLE SPASMS. 30 tablet 1  . metoprolol tartrate (LOPRESSOR) 25 MG tablet Take 0.5 tablets (12.5 mg total) by mouth 2 (two) times daily. 180 tablet 11  . nitroGLYCERIN (NITROSTAT) 0.4 MG SL tablet Place 1 tablet (0.4 mg total) under the tongue every 5 (five) minutes as needed for chest pain. 25 tablet 2  . omeprazole (PRILOSEC OTC) 20 MG tablet Take 1 tablet (20 mg total) by mouth daily. 90 tablet 3  . PARoxetine (PAXIL) 20 MG tablet Take 1 tablet (20 mg total) by mouth every morning. 90   tablet 3  . pioglitazone (ACTOS) 30 MG tablet Take 1 tablet (30 mg total) by mouth daily. 90 tablet 1  . rosuvastatin (CRESTOR) 10 MG tablet Take 1 tablet (10 mg total) by mouth daily. 90 tablet 3  . sitaGLIPtin (JANUVIA) 100 MG tablet Take 1 tablet (100 mg total) by mouth daily. 90 tablet 1  . docusate sodium (COLACE) 100 MG capsule Take 1  capsule (100 mg total) by mouth 2 (two) times daily. (Patient not taking: Reported on 07/14/2019) 10 capsule 0  . gabapentin (NEURONTIN) 300 MG capsule Take 1 capsule (300 mg total) by mouth 3 (three) times daily. (Patient not taking: Reported on 07/14/2019) 90 capsule 3   No current facility-administered medications on file prior to visit.    ROS Review of Systems  Constitutional: Negative.   HENT: Negative for congestion.   Eyes: Negative for visual disturbance.  Respiratory: Negative for shortness of breath.   Cardiovascular: Negative for chest pain.  Gastrointestinal: Negative for abdominal pain, constipation, diarrhea, nausea and vomiting.  Genitourinary: Negative for difficulty urinating.  Musculoskeletal: Positive for arthralgias. Negative for myalgias.  Neurological: Negative for headaches.  Psychiatric/Behavioral: Negative for sleep disturbance.    Objective:  BP (!) 152/90   Pulse (!) 117   Temp 98.3 F (36.8 C) (Temporal)   Resp 20   Ht 5' 3" (1.6 m)   Wt 212 lb 6 oz (96.3 kg)   LMP 07/05/2010   SpO2 98%   BMI 37.62 kg/m   BP Readings from Last 3 Encounters:  07/14/19 (!) 152/90  06/17/19 (!) 143/97  05/09/19 122/75    Wt Readings from Last 3 Encounters:  07/14/19 212 lb 6 oz (96.3 kg)  06/17/19 203 lb (92.1 kg)  05/20/19 203 lb (92.1 kg)     Physical Exam Constitutional:      General: She is not in acute distress.    Appearance: She is well-developed.  HENT:     Head: Normocephalic and atraumatic.  Eyes:     Conjunctiva/sclera: Conjunctivae normal.     Pupils: Pupils are equal, round, and reactive to light.  Neck:     Thyroid: No thyromegaly.  Cardiovascular:     Rate and Rhythm: Normal rate and regular rhythm.     Heart sounds: Normal heart sounds. No murmur.  Pulmonary:     Effort: Pulmonary effort is normal. No respiratory distress.     Breath sounds: Normal breath sounds. No wheezing or rales.  Abdominal:     General: Bowel sounds are  normal. There is no distension.     Palpations: Abdomen is soft.     Tenderness: There is no abdominal tenderness.  Musculoskeletal:        General: Normal range of motion.     Cervical back: Normal range of motion and neck supple.  Lymphadenopathy:     Cervical: No cervical adenopathy.  Skin:    General: Skin is warm and dry.  Neurological:     Mental Status: She is alert and oriented to person, place, and time.  Psychiatric:        Behavior: Behavior normal.        Thought Content: Thought content normal.        Judgment: Judgment normal.      Assessment & Plan:   Annamae was seen today for medical management of chronic issues.  Diagnoses and all orders for this visit:  Type 2 diabetes mellitus with hyperglycemia, without long-term current use of insulin ([place]) -  Bayer DCA Hb A1c Waived -     CBC with Differential/Platelet -     CMP14+EGFR -     Lipid panel  Controlled substance agreement signed -     ToxASSURE Select 13 (MW), Urine  Encounter for screening for HIV -     HIV Antibody (routine testing w rflx)  Need for hepatitis C screening test -     Hepatitis C antibody  Essential hypertension, benign -     CBC with Differential/Platelet -     CMP14+EGFR -     Lipid panel -     lisinopril (ZESTRIL) 20 MG tablet; Take 1 tablet (20 mg total) by mouth daily.  Mixed hyperlipidemia -     CBC with Differential/Platelet -     CMP14+EGFR -     Lipid panel  Primary osteoarthritis involving multiple joints -     celecoxib (CELEBREX) 200 MG capsule; Take 1 capsule (200 mg total) by mouth daily. With food   I have discontinued Iola Casher's oxyCODONE. I have also changed her lisinopril. Additionally, I am having her start on celecoxib. Lastly, I am having her maintain her meclizine, blood glucose meter kit and supplies, nitroGLYCERIN, pioglitazone, gabapentin, omeprazole, PARoxetine, sitaGLIPtin, Jardiance, glipiZIDE, ALPRAZolam, rosuvastatin, aspirin, docusate  sodium, ferrous sulfate, metoprolol tartrate, and methocarbamol.  Meds ordered this encounter  Medications  . celecoxib (CELEBREX) 200 MG capsule    Sig: Take 1 capsule (200 mg total) by mouth daily. With food    Dispense:  30 capsule    Refill:  5  . lisinopril (ZESTRIL) 20 MG tablet    Sig: Take 1 tablet (20 mg total) by mouth daily.    Dispense:  90 tablet    Refill:  1     Follow-up: Return in about 3 months (around 10/14/2019).  Claretta Fraise, M.D.

## 2019-07-15 ENCOUNTER — Ambulatory Visit (INDEPENDENT_AMBULATORY_CARE_PROVIDER_SITE_OTHER): Admitting: Orthopaedic Surgery

## 2019-07-15 VITALS — BP 124/78 | HR 88

## 2019-07-15 DIAGNOSIS — Z96642 Presence of left artificial hip joint: Secondary | ICD-10-CM

## 2019-07-15 LAB — CBC WITH DIFFERENTIAL/PLATELET
Basophils Absolute: 0 10*3/uL (ref 0.0–0.2)
Basos: 0 %
EOS (ABSOLUTE): 0.2 10*3/uL (ref 0.0–0.4)
Eos: 2 %
Hematocrit: 41.7 % (ref 34.0–46.6)
Hemoglobin: 13.4 g/dL (ref 11.1–15.9)
Immature Grans (Abs): 0.1 10*3/uL (ref 0.0–0.1)
Immature Granulocytes: 1 %
Lymphocytes Absolute: 1 10*3/uL (ref 0.7–3.1)
Lymphs: 9 %
MCH: 30.6 pg (ref 26.6–33.0)
MCHC: 32.1 g/dL (ref 31.5–35.7)
MCV: 95 fL (ref 79–97)
Monocytes Absolute: 0.4 10*3/uL (ref 0.1–0.9)
Monocytes: 4 %
Neutrophils Absolute: 8.9 10*3/uL — ABNORMAL HIGH (ref 1.4–7.0)
Neutrophils: 84 %
Platelets: 228 10*3/uL (ref 150–450)
RBC: 4.38 x10E6/uL (ref 3.77–5.28)
RDW: 12.6 % (ref 11.7–15.4)
WBC: 10.5 10*3/uL (ref 3.4–10.8)

## 2019-07-15 LAB — LIPID PANEL
Chol/HDL Ratio: 2.8 ratio (ref 0.0–4.4)
Cholesterol, Total: 191 mg/dL (ref 100–199)
HDL: 69 mg/dL (ref >39)
LDL Chol Calc (NIH): 95 mg/dL (ref 0–99)
Triglycerides: 157 mg/dL — ABNORMAL HIGH (ref 0–149)
VLDL Cholesterol Cal: 27 mg/dL (ref 5–40)

## 2019-07-15 LAB — CMP14+EGFR
ALT: 17 IU/L (ref 0–32)
AST: 18 IU/L (ref 0–40)
Albumin/Globulin Ratio: 1.6 (ref 1.2–2.2)
Albumin: 4.2 g/dL (ref 3.8–4.9)
Alkaline Phosphatase: 86 IU/L (ref 39–117)
BUN/Creatinine Ratio: 12 (ref 9–23)
BUN: 15 mg/dL (ref 6–24)
Bilirubin Total: 0.5 mg/dL (ref 0.0–1.2)
CO2: 23 mmol/L (ref 20–29)
Calcium: 9.3 mg/dL (ref 8.7–10.2)
Chloride: 103 mmol/L (ref 96–106)
Creatinine, Ser: 1.25 mg/dL — ABNORMAL HIGH (ref 0.57–1.00)
GFR calc Af Amer: 55 mL/min/{1.73_m2} — ABNORMAL LOW (ref >59)
GFR calc non Af Amer: 48 mL/min/{1.73_m2} — ABNORMAL LOW (ref >59)
Globulin, Total: 2.6 g/dL (ref 1.5–4.5)
Glucose: 268 mg/dL — ABNORMAL HIGH (ref 65–99)
Potassium: 4.4 mmol/L (ref 3.5–5.2)
Sodium: 141 mmol/L (ref 134–144)
Total Protein: 6.8 g/dL (ref 6.0–8.5)

## 2019-07-15 LAB — HEPATITIS C ANTIBODY: Hep C Virus Ab: <0.1 s/co ratio (ref 0.0–0.9)

## 2019-07-15 LAB — HIV ANTIBODY (ROUTINE TESTING W REFLEX): HIV Screen 4th Generation wRfx: NONREACTIVE

## 2019-07-15 NOTE — Progress Notes (Signed)
   Post-Op Visit Note   Patient: Natalie Tanner           Date of Birth: 05-25-1960           MRN: [id number] Visit Date: 07/15/2019 PCP: Claretta Fraise, MD   Assessment & Plan: Post left total hip arthroplasty.  She is walking without a limp she has completed therapy.  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  She is happy with the surgical result.  She can return as needed.  Chief Complaint:  Chief Complaint  Patient presents with  . Left Hip - Routine Post Op   Visit Diagnoses: Post total hip arthroplasty left.  Plan: return PRN  Follow-Up Instructions: prn.   Orders:  No orders of the defined types were placed in this encounter.  No orders of the defined types were placed in this encounter.   Imaging: No results found.  PMFS History: Patient Active Problem List   Diagnosis Date Noted  . Hypotension 05/08/2019  . Acute blood loss anemia 05/08/2019  . Type 2 diabetes mellitus with vascular disease ([place]) 05/08/2019  . Hx of total hip arthroplasty, left 05/07/2019  . Plantar fasciitis, right 09/16/2018  . Achilles tendon disorder, right 09/16/2018  . Concussion with loss of consciousness 11/17/2017  . Other complicated headache syndrome 11/17/2017  . Osteoarthritis of finger 03/14/2017  . Trigger finger of left thumb 11/27/2016  . Calcification of tendon 11/27/2016  . Arthralgia of both hands 08/26/2016  . Labyrinthitis of left ear 02/21/2016  . Inner ear disease, left 02/21/2016  . Bilateral hearing loss 06/26/2015  . 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 Coronary atherosclerosis of native coronary artery 08/13/2010  . Diabetes ([place]) 08/13/2010  . Mixed hyperlipidemia 08/13/2010  . Essential hypertension, benign 08/13/2010  . Iron deficiency anemia 08/13/2010   Past Medical History:  Diagnosis Date  . Anginal pain ([place])   . Anxiety   . Coronary atherosclerosis of native coronary artery    Managed  medically following cardiac catheterization 5/12  . Depression   . Essential hypertension, benign   . GERD (gastroesophageal reflux disease)   . History of kidney stones   . Iron deficiency anemia    Secondary to menometrorrhagia  . Mixed hyperlipidemia   . Myocardial infarction [facility])    NSTEMI 5/12  . Nephrolithiasis   . Stroke ([place]) 2017  . Type 2 diabetes mellitus (HCC)     Family History  Problem Relation Age of Onset  . Other Father        Clostridium difficile  . Stroke Father     Past Surgical History:  Procedure Laterality Date  . BACK SURGERY    . KNEE SURGERY    . SINUS SURGERY WITH INSTATRAK    . TOTAL HIP ARTHROPLASTY Left 05/07/2019   Procedure: LEFT TOTAL HIP ARTHROPLASTY DIRECT ANTERIOR;  Surgeon: Marybelle Killings, MD;  Location: [place];  Service: Orthopedics;  Laterality: Left;   Social History   Occupational History  . Occupation: [employer]  Tobacco Use  . Smoking status: Former Smoker    Packs/day: 1.00    Years: 20.00    Pack years: 20.00    Types: Cigarettes    Quit date: 03/04/2001    Years since quitting: 18.3  . Smokeless tobacco: Never Used  Substance and Sexual Activity  . Alcohol use: No  . Drug use: No  . Sexual activity: Yes

## 2019-07-19 LAB — TOXASSURE SELECT 13 (MW), URINE

## 2019-07-27 ENCOUNTER — Other Ambulatory Visit: Payer: Self-pay | Admitting: *Deleted

## 2019-07-27 ENCOUNTER — Other Ambulatory Visit: Payer: Self-pay | Admitting: Family Medicine

## 2019-07-27 DIAGNOSIS — F419 Anxiety disorder, unspecified: Secondary | ICD-10-CM

## 2019-07-27 MED ORDER — ALPRAZOLAM 1 MG PO TABS: 1.0000 mg | ORAL_TABLET | Freq: Two times a day (BID) | ORAL | 1 refills | Status: DC | PRN

## 2019-08-09 ENCOUNTER — Other Ambulatory Visit: Payer: Self-pay

## 2019-08-09 ENCOUNTER — Encounter (HOSPITAL_COMMUNITY): Payer: Self-pay | Admitting: *Deleted

## 2019-08-09 ENCOUNTER — Inpatient Hospital Stay (HOSPITAL_COMMUNITY)
Admission: EM | Admit: 2019-08-09 | Discharge: 2019-08-11 | DRG: 684 | Disposition: A | Discharge status: Home / Self Care | Attending: Internal Medicine | Admitting: Internal Medicine

## 2019-08-09 DIAGNOSIS — N179 Acute kidney failure, unspecified: Secondary | ICD-10-CM | POA: Diagnosis present

## 2019-08-09 DIAGNOSIS — N921 Excessive and frequent menstruation with irregular cycle: Secondary | ICD-10-CM | POA: Diagnosis not present

## 2019-08-09 DIAGNOSIS — M5442 Lumbago with sciatica, left side: Secondary | ICD-10-CM | POA: Diagnosis not present

## 2019-08-09 DIAGNOSIS — I2510 Atherosclerotic heart disease of native coronary artery without angina pectoris: Secondary | ICD-10-CM | POA: Diagnosis not present

## 2019-08-09 DIAGNOSIS — Z87442 Personal history of urinary calculi: Secondary | ICD-10-CM

## 2019-08-09 DIAGNOSIS — E782 Mixed hyperlipidemia: Secondary | ICD-10-CM | POA: Diagnosis not present

## 2019-08-09 DIAGNOSIS — F419 Anxiety disorder, unspecified: Secondary | ICD-10-CM | POA: Diagnosis present

## 2019-08-09 DIAGNOSIS — I252 Old myocardial infarction: Secondary | ICD-10-CM | POA: Diagnosis not present

## 2019-08-09 DIAGNOSIS — Z8673 Personal history of transient ischemic attack (TIA), and cerebral infarction without residual deficits: Secondary | ICD-10-CM

## 2019-08-09 DIAGNOSIS — X500XXA Overexertion from strenuous movement or load, initial encounter: Secondary | ICD-10-CM | POA: Diagnosis not present

## 2019-08-09 DIAGNOSIS — T464X5A Adverse effect of angiotensin-converting-enzyme inhibitors, initial encounter: Secondary | ICD-10-CM | POA: Diagnosis present

## 2019-08-09 DIAGNOSIS — E119 Type 2 diabetes mellitus without complications: Secondary | ICD-10-CM | POA: Diagnosis not present

## 2019-08-09 DIAGNOSIS — Z823 Family history of stroke: Secondary | ICD-10-CM

## 2019-08-09 DIAGNOSIS — D509 Iron deficiency anemia, unspecified: Secondary | ICD-10-CM | POA: Diagnosis present

## 2019-08-09 DIAGNOSIS — Z96642 Presence of left artificial hip joint: Secondary | ICD-10-CM | POA: Diagnosis not present

## 2019-08-09 DIAGNOSIS — Z7984 Long term (current) use of oral hypoglycemic drugs: Secondary | ICD-10-CM | POA: Diagnosis not present

## 2019-08-09 DIAGNOSIS — E860 Dehydration: Secondary | ICD-10-CM | POA: Diagnosis present

## 2019-08-09 DIAGNOSIS — F329 Major depressive disorder, single episode, unspecified: Secondary | ICD-10-CM | POA: Diagnosis not present

## 2019-08-09 DIAGNOSIS — I10 Essential (primary) hypertension: Secondary | ICD-10-CM | POA: Diagnosis present

## 2019-08-09 DIAGNOSIS — Z7982 Long term (current) use of aspirin: Secondary | ICD-10-CM | POA: Diagnosis not present

## 2019-08-09 DIAGNOSIS — G8929 Other chronic pain: Secondary | ICD-10-CM | POA: Diagnosis present

## 2019-08-09 DIAGNOSIS — K219 Gastro-esophageal reflux disease without esophagitis: Secondary | ICD-10-CM | POA: Diagnosis present

## 2019-08-09 DIAGNOSIS — Z79899 Other long term (current) drug therapy: Secondary | ICD-10-CM

## 2019-08-09 DIAGNOSIS — T39395A Adverse effect of other nonsteroidal anti-inflammatory drugs [NSAID], initial encounter: Secondary | ICD-10-CM | POA: Diagnosis present

## 2019-08-09 DIAGNOSIS — Y990 Civilian activity done for income or pay: Secondary | ICD-10-CM

## 2019-08-09 DIAGNOSIS — E1165 Type 2 diabetes mellitus with hyperglycemia: Secondary | ICD-10-CM

## 2019-08-09 DIAGNOSIS — Z20822 Contact with and (suspected) exposure to covid-19: Secondary | ICD-10-CM | POA: Diagnosis not present

## 2019-08-09 DIAGNOSIS — I959 Hypotension, unspecified: Secondary | ICD-10-CM | POA: Diagnosis not present

## 2019-08-09 DIAGNOSIS — Z87891 Personal history of nicotine dependence: Secondary | ICD-10-CM | POA: Diagnosis not present

## 2019-08-09 DIAGNOSIS — E1159 Type 2 diabetes mellitus with other circulatory complications: Secondary | ICD-10-CM | POA: Diagnosis present

## 2019-08-09 MED ORDER — KETOROLAC TROMETHAMINE 30 MG/ML IJ SOLN
15.0000 mg | Freq: Once | INTRAMUSCULAR | Status: AC
  Administered 2019-08-10: 15 mg via INTRAMUSCULAR
  Filled 2019-08-09: qty 1

## 2019-08-09 NOTE — ED Triage Notes (Signed)
Pt with lower back pain today.  Pt left work early due to pain.

## 2019-08-10 ENCOUNTER — Inpatient Hospital Stay (HOSPITAL_COMMUNITY)

## 2019-08-10 ENCOUNTER — Emergency Department (HOSPITAL_COMMUNITY)

## 2019-08-10 DIAGNOSIS — E860 Dehydration: Secondary | ICD-10-CM | POA: Diagnosis present

## 2019-08-10 DIAGNOSIS — M5442 Lumbago with sciatica, left side: Secondary | ICD-10-CM

## 2019-08-10 DIAGNOSIS — I2510 Atherosclerotic heart disease of native coronary artery without angina pectoris: Secondary | ICD-10-CM | POA: Diagnosis present

## 2019-08-10 DIAGNOSIS — E1159 Type 2 diabetes mellitus with other circulatory complications: Secondary | ICD-10-CM | POA: Diagnosis not present

## 2019-08-10 DIAGNOSIS — Z79899 Other long term (current) drug therapy: Secondary | ICD-10-CM | POA: Diagnosis not present

## 2019-08-10 DIAGNOSIS — G8929 Other chronic pain: Secondary | ICD-10-CM | POA: Diagnosis present

## 2019-08-10 DIAGNOSIS — I959 Hypotension, unspecified: Secondary | ICD-10-CM | POA: Diagnosis present

## 2019-08-10 DIAGNOSIS — Z87891 Personal history of nicotine dependence: Secondary | ICD-10-CM | POA: Diagnosis not present

## 2019-08-10 DIAGNOSIS — X500XXA Overexertion from strenuous movement or load, initial encounter: Secondary | ICD-10-CM | POA: Diagnosis not present

## 2019-08-10 DIAGNOSIS — Y990 Civilian activity done for income or pay: Secondary | ICD-10-CM | POA: Diagnosis not present

## 2019-08-10 DIAGNOSIS — N921 Excessive and frequent menstruation with irregular cycle: Secondary | ICD-10-CM | POA: Diagnosis present

## 2019-08-10 DIAGNOSIS — F419 Anxiety disorder, unspecified: Secondary | ICD-10-CM | POA: Diagnosis present

## 2019-08-10 DIAGNOSIS — N179 Acute kidney failure, unspecified: Principal | ICD-10-CM

## 2019-08-10 DIAGNOSIS — E782 Mixed hyperlipidemia: Secondary | ICD-10-CM

## 2019-08-10 DIAGNOSIS — I10 Essential (primary) hypertension: Secondary | ICD-10-CM

## 2019-08-10 DIAGNOSIS — I252 Old myocardial infarction: Secondary | ICD-10-CM | POA: Diagnosis not present

## 2019-08-10 DIAGNOSIS — E119 Type 2 diabetes mellitus without complications: Secondary | ICD-10-CM | POA: Diagnosis present

## 2019-08-10 DIAGNOSIS — Z20822 Contact with and (suspected) exposure to covid-19: Secondary | ICD-10-CM | POA: Diagnosis present

## 2019-08-10 DIAGNOSIS — Z823 Family history of stroke: Secondary | ICD-10-CM | POA: Diagnosis not present

## 2019-08-10 DIAGNOSIS — Z7982 Long term (current) use of aspirin: Secondary | ICD-10-CM | POA: Diagnosis not present

## 2019-08-10 DIAGNOSIS — F329 Major depressive disorder, single episode, unspecified: Secondary | ICD-10-CM | POA: Diagnosis present

## 2019-08-10 DIAGNOSIS — Z87442 Personal history of urinary calculi: Secondary | ICD-10-CM | POA: Diagnosis not present

## 2019-08-10 DIAGNOSIS — Z8673 Personal history of transient ischemic attack (TIA), and cerebral infarction without residual deficits: Secondary | ICD-10-CM | POA: Diagnosis not present

## 2019-08-10 DIAGNOSIS — D509 Iron deficiency anemia, unspecified: Secondary | ICD-10-CM | POA: Diagnosis present

## 2019-08-10 DIAGNOSIS — Z7984 Long term (current) use of oral hypoglycemic drugs: Secondary | ICD-10-CM | POA: Diagnosis not present

## 2019-08-10 DIAGNOSIS — Z96642 Presence of left artificial hip joint: Secondary | ICD-10-CM | POA: Diagnosis present

## 2019-08-10 DIAGNOSIS — K219 Gastro-esophageal reflux disease without esophagitis: Secondary | ICD-10-CM | POA: Diagnosis present

## 2019-08-10 LAB — CBG MONITORING, ED
Glucose-Capillary: 95 mg/dL (ref 70–99)
Glucose-Capillary: 175 mg/dL — ABNORMAL HIGH (ref 70–99)

## 2019-08-10 LAB — GLUCOSE, CAPILLARY
Glucose-Capillary: 172 mg/dL — ABNORMAL HIGH (ref 70–99)
Glucose-Capillary: 86 mg/dL (ref 70–99)

## 2019-08-10 LAB — BASIC METABOLIC PANEL
Anion gap: 15 (ref 5–15)
Anion gap: 18 — ABNORMAL HIGH (ref 5–15)
BUN: 55 mg/dL — ABNORMAL HIGH (ref 6–20)
BUN: 55 mg/dL — ABNORMAL HIGH (ref 6–20)
CO2: 21 mmol/L — ABNORMAL LOW (ref 22–32)
CO2: 18 mmol/L — ABNORMAL LOW (ref 22–32)
Calcium: 8.8 mg/dL — ABNORMAL LOW (ref 8.9–10.3)
Calcium: 8.6 mg/dL — ABNORMAL LOW (ref 8.9–10.3)
Chloride: 101 mmol/L (ref 98–111)
Chloride: 103 mmol/L (ref 98–111)
Creatinine, Ser: 6.11 mg/dL — ABNORMAL HIGH (ref 0.44–1.00)
Creatinine, Ser: 5.4 mg/dL — ABNORMAL HIGH (ref 0.44–1.00)
GFR calc Af Amer: 8 mL/min — ABNORMAL LOW (ref >60)
GFR calc Af Amer: 9 mL/min — ABNORMAL LOW (ref >60)
GFR calc non Af Amer: 7 mL/min — ABNORMAL LOW (ref >60)
GFR calc non Af Amer: 8 mL/min — ABNORMAL LOW (ref >60)
Glucose, Bld: 138 mg/dL — ABNORMAL HIGH (ref 70–99)
Glucose, Bld: 95 mg/dL (ref 70–99)
Potassium: 4.1 mmol/L (ref 3.5–5.1)
Potassium: 3.6 mmol/L (ref 3.5–5.1)
Sodium: 137 mmol/L (ref 135–145)
Sodium: 139 mmol/L (ref 135–145)

## 2019-08-10 LAB — BASIC METABOLIC PANEL WITH GFR
Anion gap: 14 (ref 5–15)
BUN: 55 mg/dL — ABNORMAL HIGH (ref 6–20)
CO2: 20 mmol/L — ABNORMAL LOW (ref 22–32)
Calcium: 8.3 mg/dL — ABNORMAL LOW (ref 8.9–10.3)
Chloride: 104 mmol/L (ref 98–111)
Creatinine, Ser: 4.73 mg/dL — ABNORMAL HIGH (ref 0.44–1.00)
GFR calc Af Amer: 11 mL/min — ABNORMAL LOW (ref >60)
GFR calc non Af Amer: 9 mL/min — ABNORMAL LOW (ref >60)
Glucose, Bld: 142 mg/dL — ABNORMAL HIGH (ref 70–99)
Potassium: 3.7 mmol/L (ref 3.5–5.1)
Sodium: 138 mmol/L (ref 135–145)

## 2019-08-10 LAB — URINALYSIS, ROUTINE W REFLEX MICROSCOPIC
Glucose, UA: NEGATIVE mg/dL
Hgb urine dipstick: NEGATIVE
Ketones, ur: NEGATIVE mg/dL
Nitrite: NEGATIVE
Protein, ur: 100 mg/dL — AB
Specific Gravity, Urine: 1.023 (ref 1.005–1.030)
pH: 5 (ref 5.0–8.0)

## 2019-08-10 LAB — CBC WITH DIFFERENTIAL/PLATELET
Abs Immature Granulocytes: 0.03 10*3/uL (ref 0.00–0.07)
Basophils Absolute: 0 10*3/uL (ref 0.0–0.1)
Basophils Relative: 0 %
Eosinophils Absolute: 0.2 10*3/uL (ref 0.0–0.5)
Eosinophils Relative: 2 %
HCT: 42.1 % (ref 36.0–46.0)
Hemoglobin: 13.9 g/dL (ref 12.0–15.0)
Immature Granulocytes: 0 %
Lymphocytes Relative: 21 %
Lymphs Abs: 2.2 10*3/uL (ref 0.7–4.0)
MCH: 31.1 pg (ref 26.0–34.0)
MCHC: 33 g/dL (ref 30.0–36.0)
MCV: 94.2 fL (ref 80.0–100.0)
Monocytes Absolute: 1 10*3/uL (ref 0.1–1.0)
Monocytes Relative: 9 %
Neutro Abs: 7 10*3/uL (ref 1.7–7.7)
Neutrophils Relative %: 68 %
Platelets: 224 10*3/uL (ref 150–400)
RBC: 4.47 MIL/uL (ref 3.87–5.11)
RDW: 13 % (ref 11.5–15.5)
WBC: 10.5 10*3/uL (ref 4.0–10.5)
nRBC: 0 % (ref 0.0–0.2)

## 2019-08-10 LAB — LACTIC ACID, PLASMA
Lactic Acid, Venous: 1.2 mmol/L (ref 0.5–1.9)
Lactic Acid, Venous: 1 mmol/L (ref 0.5–1.9)

## 2019-08-10 LAB — SARS CORONAVIRUS 2 BY RT PCR (HOSPITAL ORDER, PERFORMED IN ~~LOC~~ HOSPITAL LAB): SARS Coronavirus 2: NEGATIVE

## 2019-08-10 MED ORDER — MECLIZINE HCL 12.5 MG PO TABS: 25.0000 mg | ORAL_TABLET | Freq: Three times a day (TID) | ORAL | Status: DC | PRN

## 2019-08-10 MED ORDER — PAROXETINE HCL 20 MG PO TABS
20.0000 mg | ORAL_TABLET | Freq: Every day | ORAL | Status: DC
  Administered 2019-08-10 – 2019-08-11 (×2): 20 mg via ORAL
  Filled 2019-08-10 (×3): qty 1

## 2019-08-10 MED ORDER — SODIUM CHLORIDE 0.9 % IV SOLN: INTRAVENOUS | Status: DC

## 2019-08-10 MED ORDER — SODIUM CHLORIDE 0.45 % IV SOLN: INTRAVENOUS | Status: DC

## 2019-08-10 MED ORDER — METOPROLOL TARTRATE 25 MG PO TABS
12.5000 mg | ORAL_TABLET | Freq: Two times a day (BID) | ORAL | Status: DC
  Administered 2019-08-10: 12.5 mg via ORAL
  Filled 2019-08-10: qty 1

## 2019-08-10 MED ORDER — DOCUSATE SODIUM 100 MG PO CAPS
100.0000 mg | ORAL_CAPSULE | Freq: Two times a day (BID) | ORAL | Status: DC
  Administered 2019-08-10 – 2019-08-11 (×3): 100 mg via ORAL
  Filled 2019-08-10 (×3): qty 1

## 2019-08-10 MED ORDER — TRAMADOL HCL 50 MG PO TABS: 50.0000 mg | ORAL_TABLET | Freq: Two times a day (BID) | ORAL | Status: DC | PRN

## 2019-08-10 MED ORDER — NITROGLYCERIN 0.4 MG SL SUBL: 0.4000 mg | SUBLINGUAL_TABLET | SUBLINGUAL | Status: DC | PRN

## 2019-08-10 MED ORDER — SODIUM CHLORIDE 0.9 % IV SOLN: Freq: Once | INTRAVENOUS | Status: AC

## 2019-08-10 MED ORDER — INSULIN GLARGINE 100 UNIT/ML ~~LOC~~ SOLN
10.0000 [IU] | Freq: Every day | SUBCUTANEOUS | Status: DC
  Filled 2019-08-10: qty 0.1

## 2019-08-10 MED ORDER — ASPIRIN EC 325 MG PO TBEC
325.0000 mg | DELAYED_RELEASE_TABLET | Freq: Every day | ORAL | Status: DC
  Administered 2019-08-10 – 2019-08-11 (×2): 325 mg via ORAL
  Filled 2019-08-10 (×2): qty 1

## 2019-08-10 MED ORDER — ROSUVASTATIN CALCIUM 10 MG PO TABS
10.0000 mg | ORAL_TABLET | Freq: Every day | ORAL | Status: DC
  Administered 2019-08-10: 10 mg via ORAL
  Filled 2019-08-10 (×2): qty 1

## 2019-08-10 MED ORDER — METHOCARBAMOL 500 MG PO TABS: 500.0000 mg | ORAL_TABLET | Freq: Three times a day (TID) | ORAL | Status: DC | PRN

## 2019-08-10 MED ORDER — GLIPIZIDE 5 MG PO TABS
10.0000 mg | ORAL_TABLET | Freq: Every day | ORAL | Status: DC
  Administered 2019-08-10: 10 mg via ORAL
  Filled 2019-08-10 (×2): qty 1

## 2019-08-10 MED ORDER — HEPARIN SODIUM (PORCINE) 5000 UNIT/ML IJ SOLN
5000.0000 [IU] | Freq: Three times a day (TID) | INTRAMUSCULAR | Status: DC
  Administered 2019-08-10 – 2019-08-11 (×5): 5000 [IU] via SUBCUTANEOUS
  Filled 2019-08-10 (×6): qty 1

## 2019-08-10 MED ORDER — ALPRAZOLAM 1 MG PO TABS: 1.0000 mg | ORAL_TABLET | Freq: Two times a day (BID) | ORAL | Status: DC | PRN

## 2019-08-10 MED ORDER — FERROUS SULFATE 325 (65 FE) MG PO TABS
325.0000 mg | ORAL_TABLET | Freq: Three times a day (TID) | ORAL | Status: DC
  Administered 2019-08-10 – 2019-08-11 (×4): 325 mg via ORAL
  Filled 2019-08-10 (×4): qty 1

## 2019-08-10 MED ORDER — INSULIN ASPART 100 UNIT/ML ~~LOC~~ SOLN
0.0000 [IU] | Freq: Three times a day (TID) | SUBCUTANEOUS | Status: DC
  Administered 2019-08-10 (×2): 4 [IU] via SUBCUTANEOUS
  Filled 2019-08-10: qty 1

## 2019-08-10 MED ORDER — AMLODIPINE BESYLATE 5 MG PO TABS
5.0000 mg | ORAL_TABLET | Freq: Every day | ORAL | Status: DC
  Filled 2019-08-10: qty 1

## 2019-08-10 MED ORDER — PIOGLITAZONE HCL 15 MG PO TABS
30.0000 mg | ORAL_TABLET | Freq: Every day | ORAL | Status: DC
  Administered 2019-08-10: 30 mg via ORAL
  Filled 2019-08-10 (×2): qty 1

## 2019-08-10 MED ORDER — SODIUM CHLORIDE 0.9 % IV BOLUS
1000.0000 mL | Freq: Once | INTRAVENOUS | Status: AC
  Administered 2019-08-10: 1000 mL via INTRAVENOUS

## 2019-08-10 MED ORDER — INSULIN ASPART 100 UNIT/ML ~~LOC~~ SOLN
0.0000 [IU] | Freq: Three times a day (TID) | SUBCUTANEOUS | Status: DC
  Administered 2019-08-11: 1 [IU] via SUBCUTANEOUS

## 2019-08-10 MED ORDER — OMEPRAZOLE MAGNESIUM 20 MG PO TBEC: 20.0000 mg | DELAYED_RELEASE_TABLET | Freq: Every day | ORAL | Status: DC

## 2019-08-10 MED ORDER — PANTOPRAZOLE SODIUM 40 MG PO TBEC
40.0000 mg | DELAYED_RELEASE_TABLET | Freq: Every day | ORAL | Status: DC
  Administered 2019-08-10 – 2019-08-11 (×2): 40 mg via ORAL
  Filled 2019-08-10 (×2): qty 1

## 2019-08-10 NOTE — H&P (Signed)
History and Physical    Natalie Tanner WGN:[id number] DOB: 13-Dec-1960 DOA: 08/09/2019  PCP: Claretta Fraise, MD (Confirm with patient/family/NH records and if not entered, this has to be entered at [facility] point of entry) Patient coming from: home  I have personally briefly reviewed patient's old medical records in [place]  Chief Complaint: back pain  HPI: Natalie Tanner is a 59 y.o. female with medical history significant of coronary artery disease, kidney stones, stroke, diabetes who presents with back pain.  Patient reports cute on chronic back pain.  She reports she has had issues with sciatica ever since having her hip replaced.  She describes back pain that radiates into her left hip and down the posterior aspect of her left leg into her knee.  She states it is worse with prolonged sitting and lifting.  She states she takes gabapentin with minimal relief.  She is also tried anti-inflammatories with some relief.  Reports that she has taken up to 800 mg of ibuprofen twice daily.  She denies any weakness, numbness, tingling, bowel or bladder difficulty.  No fevers.  Patient reports that she was at work tonight and had acute exacerbation of pain while lifting some heavy boxes.  She is not had any abdominal pain.  She states that laying still she has 0 out of 10 pain however, if she were to sit for prolonged period of time or ambulate, pain with increased to 7 out of 10. She does admit to being dehydrated.  .  ED Course: Patient was hypotensive on presentation to ED. Lab studies were remarkable for a Cr 6, up from 1.25, otherwise normal, CBC normal.  She has not prior hx renal failure. CT with some stranding around the right kidney at the upper pole suggestive of recent stone passage or possible infection. U/A equivocal with contaminates, few bacteria, 25-50 wbc/hpf. In the ED she was given a 1 liter fluid bolus with an improvement in BP. TRH is called to admit the patient for continued care and  further evaluation.  Review of Systems: As per HPI otherwise 10 point review of systems negative.    Past Medical History:  Diagnosis Date  . Anginal pain ([place])   . Anxiety   . Coronary atherosclerosis of native coronary artery    Managed medically following cardiac catheterization 5/12  . Depression   . Essential hypertension, benign   . GERD (gastroesophageal reflux disease)   . History of kidney stones   . Iron deficiency anemia    Secondary to menometrorrhagia  . Mixed hyperlipidemia   . Myocardial infarction [facility])    NSTEMI 5/12  . Nephrolithiasis   . Stroke ([place]) 2017  . Type 2 diabetes mellitus ([place])     Past Surgical History:  Procedure Laterality Date  . BACK SURGERY    . KNEE SURGERY    . SINUS SURGERY WITH INSTATRAK    . TOTAL HIP ARTHROPLASTY Left 05/07/2019   Procedure: LEFT TOTAL HIP ARTHROPLASTY DIRECT ANTERIOR;  Surgeon: Marybelle Killings, MD;  Location: [place];  Service: Orthopedics;  Laterality: Left;   Social Hx -  Married. She has two daughters and 10 grandchildren. She lives with her husband. She works as a Chartered certified accountant for [employer]. 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 She is independent in ADLs   reports that she quit smoking about 18 years ago. Her smoking use included cigarettes. She has a 20.00  pack-year smoking history. She has never used smokeless tobacco. She reports that she does not drink alcohol or use drugs.  No Known Allergies  Family History  Problem Relation Age of Onset  . Other Father        Clostridium difficile  . Stroke Father      Prior to Admission medications   Medication Sig Start Date End Date Taking? Authorizing Provider  ALPRAZolam Duanne Moron) 1 MG tablet Take 1 tablet (1 mg total) by mouth 2 (two) times daily as needed. 07/27/19   Claretta Fraise, MD  aspirin EC 325 MG EC tablet Take 1 tablet (325 mg total) by mouth daily with breakfast. 05/10/19   Jessy Oto, MD  blood glucose meter kit and supplies KIT Dispense based on patient and insurance preference. Use up to four times daily as directed. For diabetes E11.9, with hyperglycemia. 11/26/16   Terald Sleeper, PA-C  celecoxib (CELEBREX) 200 MG capsule Take 1 capsule (200 mg total) by mouth daily. With food 07/14/19   Claretta Fraise, MD  docusate sodium (COLACE) 100 MG capsule Take 1 capsule (100 mg total) by mouth 2 (two) times daily. Patient not taking: Reported on 07/14/2019 05/09/19   Jessy Oto, MD  empagliflozin (JARDIANCE) 25 MG TABS tablet Take 25 mg by mouth daily. 03/19/19   Terald Sleeper, PA-C  ferrous sulfate 325 (65 FE) MG tablet Take 1 tablet (325 mg total) by mouth 3 (three) times daily after meals. 05/09/19   Jessy Oto, MD  gabapentin (NEURONTIN) 300 MG capsule Take 1 capsule (300 mg total) by mouth 3 (three) times daily. Patient not taking: Reported on 07/14/2019 02/22/19   Evelina Dun A, FNP  glipiZIDE (GLUCOTROL) 10 MG tablet Take 1 tablet (10 mg total) by mouth daily. 03/19/19   Terald Sleeper, PA-C  lisinopril (ZESTRIL) 20 MG tablet Take 1 tablet (20 mg total) by mouth daily. 07/14/19   Claretta Fraise, MD  meclizine (ANTIVERT) 25 MG tablet Take 1 tablet (25 mg total) by mouth 3 (three) times daily as needed for dizziness. 05/23/16   Terald Sleeper, PA-C  methocarbamol (ROBAXIN) 500 MG tablet TAKE ONE TABLET BY MOUTH EVERY 8 HOURS AS NEEDED FOR MUSCLE SPASMS. 06/02/19   Jessy Oto, MD  metoprolol tartrate (LOPRESSOR) 25 MG tablet Take 0.5 tablets (12.5 mg total) by mouth 2 (two) times daily. 05/09/19   Jessy Oto, MD  nitroGLYCERIN (NITROSTAT) 0.4 MG SL tablet Place 1 tablet (0.4 mg total) under the tongue every 5 (five) minutes as needed for chest pain. 09/16/18   Terald Sleeper, PA-C  omeprazole (PRILOSEC OTC) 20 MG tablet Take 1 tablet (20 mg total) by mouth daily. 03/19/19   Terald Sleeper, PA-C  PARoxetine (PAXIL) 20 MG tablet Take 1 tablet (20 mg total) by mouth every  morning. 03/19/19   Terald Sleeper, PA-C  pioglitazone (ACTOS) 30 MG tablet Take 1 tablet (30 mg total) by mouth daily. 09/16/18   Terald Sleeper, PA-C  rosuvastatin (CRESTOR) 10 MG tablet Take 1 tablet (10 mg total) by mouth daily. 05/03/19 08/01/19  Donato Heinz, MD  sitaGLIPtin (JANUVIA) 100 MG tablet Take 1 tablet (100 mg total) by mouth daily. 03/19/19   Terald Sleeper, PA-C    Physical Exam: Vitals:   08/10/19 0000 08/10/19 0019 08/10/19 0200 08/10/19 0600  BP: (!) 87/58 (!) _0  Pulse:  76 70   Resp:  18    Temp:  TempSrc:      SpO2:  100% 96%   Weight:      Height:        Constitutional: NAD, calm, comfortable Vitals:   08/10/19 0000 08/10/19 0019 08/10/19 0200 08/10/19 0600  BP: (!) 87/58 (!) _0  Pulse:  76 70   Resp:  18    Temp:      TempSrc:      SpO2:  100% 96%   Weight:      Height:       General:  Heavyset woman in no distress Eyes: PERRL, lids and conjunctivae normal ENMT: Mucous membranes are moist. Posterior pharynx clear of any exudate or lesions.Normal dentition.  Neck: normal, supple, no masses, no thyromegaly Respiratory: clear to auscultation bilaterally, no wheezing, no crackles. Normal respiratory effort. No accessory muscle use.  Cardiovascular: Regular rate and rhythm, no murmurs / rubs / gallops. No extremity edema. 2+ pedal pulses. No carotid bruits.  Abdomen: obese, no tenderness, no masses palpated. No hepatosplenomegaly. Bowel sounds positive.  Musculoskeletal: no clubbing / cyanosis. No joint deformity upper and lower extremities. Good ROM, no contractures. Normal muscle tone.  Skin: no rashes, lesions, ulcers. No induration Neurologic: CN 2-12 grossly intact. Sensation intact. Strength 5/5 in all 4.  Psychiatric: Normal judgment and insight. Alert and oriented x 3. Normal mood.     Labs on Admission: I have personally reviewed following labs and imaging studies  CBC: Recent Labs  Lab  08/10/19 0109  WBC 10.5  NEUTROABS 7.0  HGB 13.9  HCT 42.1  MCV 94.2  PLT 024   Basic Metabolic Panel: Recent Labs  Lab 08/10/19 0109 08/10/19 0324  NA 137 139  K 4.1 3.6  CL 101 103  CO2 21* 18*  GLUCOSE 138* 95  BUN 55* 55*  CREATININE 6.11* 5.40*  CALCIUM 8.8* 8.6*   GFR: Estimated Creatinine Clearance: 12.5 mL/min (A) (by C-G formula based on SCr of 5.4 mg/dL (H)). Liver Function Tests: No results for input(s): AST, ALT, ALKPHOS, BILITOT, PROT, ALBUMIN in the last 168 hours. No results for input(s): LIPASE, AMYLASE in the last 168 hours. No results for input(s): AMMONIA in the last 168 hours. Coagulation Profile: No results for input(s): INR, PROTIME in the last 168 hours. Cardiac Enzymes: No results for input(s): CKTOTAL, CKMB, CKMBINDEX, TROPONINI in the last 168 hours. BNP (last 3 results) No results for input(s): PROBNP in the last 8760 hours. HbA1C: No results for input(s): HGBA1C in the last 72 hours. CBG: No results for input(s): GLUCAP in the last 168 hours. Lipid Profile: No results for input(s): CHOL, HDL, LDLCALC, TRIG, CHOLHDL, LDLDIRECT in the last 72 hours. Thyroid Function Tests: No results for input(s): TSH, T4TOTAL, FREET4, T3FREE, THYROIDAB in the last 72 hours. Anemia Panel: No results for input(s): VITAMINB12, FOLATE, FERRITIN, TIBC, IRON, RETICCTPCT in the last 72 hours. Urine analysis:    Component Value Date/Time   COLORURINE AMBER (A) 08/10/2019 0033   APPEARANCEUR CLOUDY (A) 08/10/2019 0033   LABSPEC 1.023 08/10/2019 0033   PHURINE 5.0 08/10/2019 0033   GLUCOSEU NEGATIVE 08/10/2019 0033   HGBUR NEGATIVE 08/10/2019 0033   BILIRUBINUR SMALL (A) 08/10/2019 0033   KETONESUR NEGATIVE 08/10/2019 0033   PROTEINUR 100 (A) 08/10/2019 0033   NITRITE NEGATIVE 08/10/2019 0033   LEUKOCYTESUR SMALL (A) 08/10/2019 0033    Radiological Exams on Admission: CT ABDOMEN PELVIS WO CONTRAST  Result Date: 08/10/2019 CLINICAL DATA:  Mid back pain,  onset today EXAM: CT ABDOMEN AND  PELVIS WITHOUT CONTRAST TECHNIQUE: Multidetector CT imaging of the abdomen and pelvis was performed following the standard protocol without IV contrast. COMPARISON:  Hip radiograph 05/07/2019, 05/20/2019, CT chest, abdomen and pelvis 09/23/2017 (report only) FINDINGS: Lower chest: Lung bases are clear. Normal heart size. No pericardial effusion. Hepatobiliary: No focal liver abnormality is seen on this noncontrast CT exam. Smooth liver surface contour. Hepatic attenuation within normal limits. Few punctate calcifications anterior to the liver (2/30) and along the falciform ligament (2/20), nonspecific but almost certainly benign. No gallstones, gallbladder wall thickening, or biliary dilatation. Pancreas: Calcification in body of the pancreas (2/23) could reflect sequela of prior pancreatitis. Pancreas otherwise unremarkable without peripancreatic inflammation or ductal dilatation. Spleen: Normal in size without focal abnormality. Adrenals/Urinary Tract: Normal adrenal glands lobular contours of both kidneys likely reflect areas of chronic scarring, right greater than left. No visible or discernible contour deforming renal lesion. Mild bilateral symmetric perinephric stranding, a nonspecific finding though may correlate with either age or decreased renal function. Focal dilatation of the right upper pole calyx without other calyceal dilatation or frank hydronephrosis. Slight asymmetric right ureterectasis as well. No obstructing calculus along the right urinary tract. Left urinary tract caliber is more normal in appearance. Bilateral nonobstructing calculi present in both kidneys. Urinary bladder is largely decompressed at the time of exam and therefore poorly evaluated by CT imaging. Bladder also partially obscured by streak artifact from left hip prosthesis. No visible bladder calculi or debris. Stomach/Bowel: Distal esophagus, stomach and duodenal sweep are unremarkable. No small  bowel wall thickening or dilatation. No evidence of obstruction. A normal appendix is visualized. No colonic dilatation or wall thickening. Scattered colonic diverticula without focal pericolonic inflammation to suggest diverticulitis. Vascular/Lymphatic: Atherosclerotic calcifications throughout the abdominal aorta and branch vessels. No aneurysm or ectasia. No enlarged abdominopelvic lymph nodes. Reproductive: Retroverted, slightly retroflexed uterus. No concerning adnexal lesions. Other: No abdominopelvic free fluid or free gas. No bowel containing hernias. Postsurgical changes along the left anterolateral thigh. Musculoskeletal: Postsurgical changes from prior left hip arthroplasty no acute hardware complication. No acute osseous abnormality or suspicious osseous lesion. Multilevel degenerative changes are present in the imaged portions of the spine. Features most pronounced L4-5. At least mild to moderate bilateral foraminal narrowing L4-5 with left L5 pars defect. Additional degenerative changes in the SI joints and right hip. IMPRESSION: 1. Focal dilatation of the right upper pole calyx without other calyceal dilatation or frank hydronephrosis. Slight asymmetric right ureterectasis as well. No obstructing calculus along the right urinary tract. Findings may represent sequela of a recently passed stone or ascending infection. Correlate with urinalysis. 2. Additional bilateral nonobstructing nephrolithiasis. 3. Colonic diverticulosis without evidence of diverticulitis. 4. Degenerative changes in the spine maximal L4-5 with left L5 pars defect and mild to moderate bilateral foraminal narrowing. 5. Aortic Atherosclerosis (ICD10-I70.0). Electronically Signed   By: Lovena Le M.D.   On: 08/10/2019 02:57    EKG: Independently reviewed. Sinus tachycardia. No acute changes  Assessment/Plan Active Problems:   AKI (acute kidney injury) ([place])   Mixed hyperlipidemia   Essential hypertension, benign   Type 2  diabetes mellitus with vascular disease ([place])  (please populate well all problems here in Problem List. (For example, if patient is on BP meds at home and you resume or decide to hold them, it is a problem that needs to be her. Same for CAD, COPD, HLD and so on)   1. AKI - patient with Cr 6, est GFR 8. Has h/o nephrolithiasis. She admits  to poor po fluid intake. Equivocal U/A but no fever, no leukocytosis arguing against UTI. May have passed a stone. This in combination with dehydration and several nephrotoxic medications (jardance, januvia, lisinopril, celebrex) may have contributed to AKI Plan Med-surg admit  Hold nephrotoxic medications  Renal U/S  IV fluids   F/u Bmet in AM 08/11/19  For lack of improvement or worsening condition may need nephrology consult  2. DM - patient has been well controlled: last A1C 5.4% 07/14/19.  Plan Hold Jardance, Januvia  Continue Glucotrol, Actos  Add Lantus 10 qHS  Sliding scale  3. HTN - hypotensive at presentation with improvement after fluid bolus. Plan Hold ACE-I  Continue B-blocker  Add CCB - norvasc 5 mg daily  4. HLD - continue home meds  5. CAD - patient has been stable with known small vessel CAD. Has had recent eval by cardiology prior to [facility]    DVT prophylaxis: heparin SQ q 8  Code Status: full code  Family Communication: left message for husband Roselynne Lortz  Disposition Plan: home when medically stable  Consults called: none  Admission status: inpatient    Adella Hare MD [facility] Pager [phone]  If 7PM-7AM, please contact night-coverage [email or web address] Password [facility]  08/10/2019, 6:29 AM

## 2019-08-10 NOTE — Progress Notes (Signed)
Patient was seen in the ER by me. She was resting in bed drinking coffee. 59 year old female with AKI history of type 2 diabetes stroke kidney stone CAD admitted with AKI secondary to NSAIDs in addition to ACE inhibitor and Metformin that she takes at home.  She was on 800 mg of ibuprofen twice a day. BUN and creatinine on admission 55/6.11 Few hours later 55 and 5.40 Repeated at 9:50 AM BUN and creatinine are 55 and 4.73. Patient is hypotensive in the ER will continue normal saline continuously and recheck labs in a.m. Stopped antihypertensives may restart as needed. Renal ultrasound no hydronephrosis. Discussed with patient and ED nurse.

## 2019-08-10 NOTE — ED Provider Notes (Signed)
[facility] EMERGENCY DEPARTMENT Provider Note   CSN: [id number] Arrival date & time: 08/09/19  2003     History Chief Complaint  Patient presents with  . Back Pain  . Hypotension    80/57 in triage    Natalie Tanner is a 59 y.o. female.  HPI     This a 59 year old female with a history of coronary artery disease, kidney stones, stroke, diabetes who presents with back pain.  Patient reports cute on chronic back pain.  She reports she has had issues with sciatica ever since having her hip replaced.  She describes back pain that radiates into her left hip and down the posterior aspect of her left leg into her knee.  She states it is worse with prolonged sitting and lifting.  She states she takes gabapentin with minimal relief.  She is also tried anti-inflammatories with some relief.  Reports that she has taken up to 800 mg of ibuprofen twice daily.  She denies any weakness, numbness, tingling, bowel or bladder difficulty.  No fevers.  Patient reports that she was at work tonight and had acute exacerbation of pain while lifting some heavy boxes.  She is not had any abdominal pain.  She states that laying still she has 0 out of 10 pain however, if she were to sit for prolonged period of time or ambulate, pain with increased to 7 out of 10.  Of note, patient noted to be hypotensive in triage.  Reports recent history of labile blood pressures.  States that prior to having her hip replaced she was noted to have low blood pressures and had her blood pressure medication titrated down including her lisinopril and isosorbide nitrate.  Postoperatively she was noted to have blood pressures elevated again and lisinopril was titrated back up.  She is otherwise asymptomatic and without dizziness, chest pain, shortness of breath.  Past Medical History:  Diagnosis Date  . Anginal pain ([place])   . Anxiety   . Coronary atherosclerosis of native coronary artery    Managed medically following cardiac  catheterization 5/12  . Depression   . Essential hypertension, benign   . GERD (gastroesophageal reflux disease)   . History of kidney stones   . Iron deficiency anemia    Secondary to menometrorrhagia  . Mixed hyperlipidemia   . Myocardial infarction [facility])    NSTEMI 5/12  . Nephrolithiasis   . Stroke ([place]) 2017  . Type 2 diabetes mellitus [facility])     Patient Active Problem List   Diagnosis Date Noted  . Hypotension 05/08/2019  . Acute blood loss anemia 05/08/2019  . Type 2 diabetes mellitus with vascular disease ([place]) 05/08/2019  . Hx of total hip arthroplasty, left 05/07/2019  . Plantar fasciitis, right 09/16/2018  . Achilles tendon disorder, right 09/16/2018  . Concussion with loss of consciousness 11/17/2017  . Other complicated headache syndrome 11/17/2017  . Osteoarthritis of finger 03/14/2017  . Trigger finger of left thumb 11/27/2016  . Calcification of tendon 11/27/2016  . Arthralgia of both hands 08/26/2016  . Bilateral hearing loss 06/26/2015  . 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 Coronary atherosclerosis of native coronary artery 08/13/2010  . Diabetes ([place]) 08/13/2010  . Mixed hyperlipidemia 08/13/2010  . Essential hypertension, benign 08/13/2010  . Iron deficiency anemia 08/13/2010    Past Surgical History:  Procedure Laterality Date  . BACK SURGERY    . KNEE SURGERY    . SINUS SURGERY WITH INSTATRAK    . TOTAL HIP ARTHROPLASTY Left 05/07/2019  Procedure: LEFT TOTAL HIP ARTHROPLASTY DIRECT ANTERIOR;  Surgeon: Marybelle Killings, MD;  Location: [facility];  Service: Orthopedics;  Laterality: Left;     OB History   No obstetric history on file.     Family History  Problem Relation Age of Onset  . Other Father        Clostridium difficile  . Stroke Father     Social History   Tobacco Use  . Smoking status: Former Smoker    Packs/day: 1.00    Years: 20.00    Pack years: 20.00    Types: Cigarettes    Quit date: 03/04/2001    Years since quitting: 18.4  .  Smokeless tobacco: Never Used  Substance Use Topics  . Alcohol use: No  . Drug use: No    Home Medications Prior to Admission medications   Medication Sig Start Date End Date Taking? Authorizing Provider  ALPRAZolam Duanne Moron) 1 MG tablet Take 1 tablet (1 mg total) by mouth 2 (two) times daily as needed. 07/27/19   Claretta Fraise, MD  aspirin EC 325 MG EC tablet Take 1 tablet (325 mg total) by mouth daily with breakfast. 05/10/19   Jessy Oto, MD  blood glucose meter kit and supplies KIT Dispense based on patient and insurance preference. Use up to four times daily as directed. For diabetes E11.9, with hyperglycemia. 11/26/16   Terald Sleeper, PA-C  celecoxib (CELEBREX) 200 MG capsule Take 1 capsule (200 mg total) by mouth daily. With food 07/14/19   Claretta Fraise, MD  docusate sodium (COLACE) 100 MG capsule Take 1 capsule (100 mg total) by mouth 2 (two) times daily. Patient not taking: Reported on 07/14/2019 05/09/19   Jessy Oto, MD  empagliflozin (JARDIANCE) 25 MG TABS tablet Take 25 mg by mouth daily. 03/19/19   Terald Sleeper, PA-C  ferrous sulfate 325 (65 FE) MG tablet Take 1 tablet (325 mg total) by mouth 3 (three) times daily after meals. 05/09/19   Jessy Oto, MD  gabapentin (NEURONTIN) 300 MG capsule Take 1 capsule (300 mg total) by mouth 3 (three) times daily. Patient not taking: Reported on 07/14/2019 02/22/19   Evelina Dun A, FNP  glipiZIDE (GLUCOTROL) 10 MG tablet Take 1 tablet (10 mg total) by mouth daily. 03/19/19   Terald Sleeper, PA-C  lisinopril (ZESTRIL) 20 MG tablet Take 1 tablet (20 mg total) by mouth daily. 07/14/19   Claretta Fraise, MD  meclizine (ANTIVERT) 25 MG tablet Take 1 tablet (25 mg total) by mouth 3 (three) times daily as needed for dizziness. 05/23/16   Terald Sleeper, PA-C  methocarbamol (ROBAXIN) 500 MG tablet TAKE ONE TABLET BY MOUTH EVERY 8 HOURS AS NEEDED FOR MUSCLE SPASMS. 06/02/19   Jessy Oto, MD  metoprolol tartrate (LOPRESSOR) 25 MG tablet Take 0.5  tablets (12.5 mg total) by mouth 2 (two) times daily. 05/09/19   Jessy Oto, MD  nitroGLYCERIN (NITROSTAT) 0.4 MG SL tablet Place 1 tablet (0.4 mg total) under the tongue every 5 (five) minutes as needed for chest pain. 09/16/18   Terald Sleeper, PA-C  omeprazole (PRILOSEC OTC) 20 MG tablet Take 1 tablet (20 mg total) by mouth daily. 03/19/19   Terald Sleeper, PA-C  PARoxetine (PAXIL) 20 MG tablet Take 1 tablet (20 mg total) by mouth every morning. 03/19/19   Terald Sleeper, PA-C  pioglitazone (ACTOS) 30 MG tablet Take 1 tablet (30 mg total) by mouth daily. 09/16/18   Particia Nearing  S, PA-C  rosuvastatin (CRESTOR) 10 MG tablet Take 1 tablet (10 mg total) by mouth daily. 05/03/19 08/01/19  Donato Heinz, MD  sitaGLIPtin (JANUVIA) 100 MG tablet Take 1 tablet (100 mg total) by mouth daily. 03/19/19   Terald Sleeper, PA-C    Allergies    Patient has no known allergies.  Review of Systems   Review of Systems  Constitutional: Negative for fever.  Respiratory: Negative for shortness of breath.   Cardiovascular: Negative for chest pain.  Gastrointestinal: Negative for abdominal pain, nausea and vomiting.  Genitourinary: Negative for difficulty urinating.  Musculoskeletal: Positive for back pain.  Neurological: Negative for weakness and numbness.  All other systems reviewed and are negative.   Physical Exam Updated Vital Signs BP 92/64   Pulse 70   Temp 97.6 F (36.4 C) (Oral)   Resp 18   Ht 1.6 m (5' 3")   Wt 95.3 kg   LMP 07/05/2010   SpO2 96%   BMI 37.20 kg/m   Physical Exam Vitals and nursing note reviewed.  Constitutional:      Appearance: She is well-developed.     Comments: Overweight, non-ill-appearing  HENT:     Head: Normocephalic and atraumatic.     Nose: Nose normal.     Mouth/Throat:     Mouth: Mucous membranes are moist.  Eyes:     Pupils: Pupils are equal, round, and reactive to light.  Cardiovascular:     Rate and Rhythm: Normal rate and regular rhythm.      Heart sounds: Normal heart sounds.  Pulmonary:     Effort: Pulmonary effort is normal. No respiratory distress.     Breath sounds: No wheezing.  Abdominal:     General: Bowel sounds are normal.     Palpations: Abdomen is soft. There is no mass.     Tenderness: There is no abdominal tenderness.  Musculoskeletal:     Cervical back: Neck supple.     Comments: Tenderness palpation lower lumbar spine without step-off or deformity, positive left straight leg raise  Skin:    General: Skin is warm and dry.  Neurological:     Mental Status: She is alert and oriented to person, place, and time.     Comments: 5 out of 5 strength bilateral lower extremities, no clonus, normal reflexes  Psychiatric:        Mood and Affect: Mood normal.     ED Results / Procedures / Treatments   Labs (all labs ordered are listed, but only abnormal results are displayed) Labs Reviewed  BASIC METABOLIC PANEL - Abnormal; Notable for the following components:      Result Value   CO2 21 (*)    Glucose, Bld 138 (*)    BUN 55 (*)    Creatinine, Ser 6.11 (*)    Calcium 8.8 (*)    GFR calc non Af Amer 7 (*)    GFR calc Af Amer 8 (*)    All other components within normal limits  URINALYSIS, ROUTINE W REFLEX MICROSCOPIC - Abnormal; Notable for the following components:   Color, Urine AMBER (*)    APPearance CLOUDY (*)    Bilirubin Urine SMALL (*)    Protein, ur 100 (*)    Leukocytes,Ua SMALL (*)    Bacteria, UA RARE (*)    All other components within normal limits  BASIC METABOLIC PANEL - Abnormal; Notable for the following components:   CO2 18 (*)    BUN 55 (*)  Creatinine, Ser 5.40 (*)    Calcium 8.6 (*)    GFR calc non Af Amer 8 (*)    GFR calc Af Amer 9 (*)    Anion gap 18 (*)    All other components within normal limits  URINE CULTURE  CBC WITH DIFFERENTIAL/PLATELET  LACTIC ACID, PLASMA  LACTIC ACID, PLASMA    EKG None  Radiology CT ABDOMEN PELVIS WO CONTRAST  Result Date:  08/10/2019 CLINICAL DATA:  Mid back pain, onset today EXAM: CT ABDOMEN AND PELVIS WITHOUT CONTRAST TECHNIQUE: Multidetector CT imaging of the abdomen and pelvis was performed following the standard protocol without IV contrast. COMPARISON:  Hip radiograph 05/07/2019, 05/20/2019, CT chest, abdomen and pelvis 09/23/2017 (report only) FINDINGS: Lower chest: Lung bases are clear. Normal heart size. No pericardial effusion. Hepatobiliary: No focal liver abnormality is seen on this noncontrast CT exam. Smooth liver surface contour. Hepatic attenuation within normal limits. Few punctate calcifications anterior to the liver (2/30) and along the falciform ligament (2/20), nonspecific but almost certainly benign. No gallstones, gallbladder wall thickening, or biliary dilatation. Pancreas: Calcification in body of the pancreas (2/23) could reflect sequela of prior pancreatitis. Pancreas otherwise unremarkable without peripancreatic inflammation or ductal dilatation. Spleen: Normal in size without focal abnormality. Adrenals/Urinary Tract: Normal adrenal glands lobular contours of both kidneys likely reflect areas of chronic scarring, right greater than left. No visible or discernible contour deforming renal lesion. Mild bilateral symmetric perinephric stranding, a nonspecific finding though may correlate with either age or decreased renal function. Focal dilatation of the right upper pole calyx without other calyceal dilatation or frank hydronephrosis. Slight asymmetric right ureterectasis as well. No obstructing calculus along the right urinary tract. Left urinary tract caliber is more normal in appearance. Bilateral nonobstructing calculi present in both kidneys. Urinary bladder is largely decompressed at the time of exam and therefore poorly evaluated by CT imaging. Bladder also partially obscured by streak artifact from left hip prosthesis. No visible bladder calculi or debris. Stomach/Bowel: Distal esophagus, stomach and  duodenal sweep are unremarkable. No small bowel wall thickening or dilatation. No evidence of obstruction. A normal appendix is visualized. No colonic dilatation or wall thickening. Scattered colonic diverticula without focal pericolonic inflammation to suggest diverticulitis. Vascular/Lymphatic: Atherosclerotic calcifications throughout the abdominal aorta and branch vessels. No aneurysm or ectasia. No enlarged abdominopelvic lymph nodes. Reproductive: Retroverted, slightly retroflexed uterus. No concerning adnexal lesions. Other: No abdominopelvic free fluid or free gas. No bowel containing hernias. Postsurgical changes along the left anterolateral thigh. Musculoskeletal: Postsurgical changes from prior left hip arthroplasty no acute hardware complication. No acute osseous abnormality or suspicious osseous lesion. Multilevel degenerative changes are present in the imaged portions of the spine. Features most pronounced L4-5. At least mild to moderate bilateral foraminal narrowing L4-5 with left L5 pars defect. Additional degenerative changes in the SI joints and right hip. IMPRESSION: 1. Focal dilatation of the right upper pole calyx without other calyceal dilatation or frank hydronephrosis. Slight asymmetric right ureterectasis as well. No obstructing calculus along the right urinary tract. Findings may represent sequela of a recently passed stone or ascending infection. Correlate with urinalysis. 2. Additional bilateral nonobstructing nephrolithiasis. 3. Colonic diverticulosis without evidence of diverticulitis. 4. Degenerative changes in the spine maximal L4-5 with left L5 pars defect and mild to moderate bilateral foraminal narrowing. 5. Aortic Atherosclerosis (ICD10-I70.0). Electronically Signed   By: Lovena Le M.D.   On: 08/10/2019 02:57    Procedures Procedures (including critical care time)  CRITICAL CARE Performed by: Loma Sousa  F Horton   Total critical care time: 40 minutes  Critical care  time was exclusive of separately billable procedures and treating other patients.  Critical care was necessary to treat or prevent imminent or life-threatening deterioration.  Critical care was time spent personally by me on the following activities: development of treatment plan with patient and/or surrogate as well as nursing, discussions with consultants, evaluation of patient's response to treatment, examination of patient, obtaining history from patient or surrogate, ordering and performing treatments and interventions, ordering and review of laboratory studies, ordering and review of radiographic studies, pulse oximetry and re-evaluation of patient's condition.   Medications Ordered in ED Medications  0.9 %  sodium chloride infusion (has no administration in time range)  ketorolac (TORADOL) 30 MG/ML injection 15 mg (15 mg Intramuscular Given 08/10/19 0007)  sodium chloride 0.9 % bolus 1,000 mL (0 mLs Intravenous Stopped 08/10/19 0228)    ED Course  I have reviewed the triage vital signs and the nursing notes.  Pertinent labs & imaging results that were available during my care of the patient were reviewed by me and considered in my medical decision making (see chart for details).    MDM Rules/Calculators/A&P                       Patient presents with back pain.  She is overall nontoxic.  Vital signs initially notable for hypotension in triage.  Patient reports recent history of labile blood pressures and low blood pressures prior to her hip replacement.  Regarding her back pain, there are no significant red flags and she describes it as her acute on chronic sciatica.  Her neurologic exam is normal.  Patient was initially given pain medication.  Repeat blood pressures consistently low.  For this reason, further work-up was initiated.  Basic lab work obtained including urinalysis to evaluate for UTI or kidney stones.  BMP now with a creatinine of greater than 6.  Most recent creatinine 1.25.   Unclear etiology.  Could be related to nephrotoxicity from NSAIDs.  No recent antibiotics.  Patient reports good urine output.  Will obtain CT to evaluate for stone or other intra-abdominal process.  CT will also allow for evaluation of caliber of aorta although she cannot get contrast.  CT scan does not show any obvious stone but she does have stranding and inflammation in the right renal collecting system concerning for infection versus recently passed stone.  Urinalysis is without obvious infection.  Regarding her hypertension, she was remained otherwise asymptomatic.  She has no leukocytosis or lactic acidosis to suggest sepsis.  Could be related to ongoing blood pressure medication use in the setting of acute kidney injury.  Patient was given a liter of fluids and started on maintenance fluids.  We will hold antibiotics as her urinalysis does not show an obvious UTI.  Discussed with admitting attending for admission.  Final Clinical Impression(s) / ED Diagnoses Final diagnoses:  AKI (acute kidney injury) ([place])  Acute midline low back pain with left-sided sciatica    Rx / DC Orders ED Discharge Orders    None       Horton, Barbette Hair, MD 08/10/19 [phone]

## 2019-08-11 DIAGNOSIS — E1165 Type 2 diabetes mellitus with hyperglycemia: Secondary | ICD-10-CM

## 2019-08-11 LAB — BASIC METABOLIC PANEL
Anion gap: 8 (ref 5–15)
BUN: 37 mg/dL — ABNORMAL HIGH (ref 6–20)
CO2: 21 mmol/L — ABNORMAL LOW (ref 22–32)
Calcium: 8.6 mg/dL — ABNORMAL LOW (ref 8.9–10.3)
Chloride: 110 mmol/L (ref 98–111)
Creatinine, Ser: 1.87 mg/dL — ABNORMAL HIGH (ref 0.44–1.00)
GFR calc Af Amer: 34 mL/min — ABNORMAL LOW (ref >60)
GFR calc non Af Amer: 29 mL/min — ABNORMAL LOW (ref >60)
Glucose, Bld: 114 mg/dL — ABNORMAL HIGH (ref 70–99)
Potassium: 3.9 mmol/L (ref 3.5–5.1)
Sodium: 139 mmol/L (ref 135–145)

## 2019-08-11 LAB — URINE CULTURE: Culture: NO GROWTH

## 2019-08-11 LAB — GLUCOSE, CAPILLARY
Glucose-Capillary: 104 mg/dL — ABNORMAL HIGH (ref 70–99)
Glucose-Capillary: 148 mg/dL — ABNORMAL HIGH (ref 70–99)

## 2019-08-11 MED ORDER — SITAGLIPTIN PHOSPHATE 25 MG PO TABS: 25.0000 mg | ORAL_TABLET | Freq: Every day | ORAL | 0 refills | Status: DC

## 2019-08-11 MED ORDER — SODIUM CHLORIDE 0.9 % IV BOLUS
500.0000 mL | Freq: Once | INTRAVENOUS | Status: AC | PRN
  Administered 2019-08-11: 500 mL via INTRAVENOUS

## 2019-08-11 MED ORDER — LACTATED RINGERS IV BOLUS
1000.0000 mL | INTRAVENOUS | Status: AC
  Administered 2019-08-11 (×2): 1000 mL via INTRAVENOUS

## 2019-08-11 NOTE — Progress Notes (Signed)
   08/11/19 0425  Assess: MEWS Score  Temp 98.9 F (37.2 C)  BP (!) 95/59  Pulse Rate (!) 103  SpO2 93 %  Assess: MEWS Score  MEWS Temp 0  MEWS Systolic 1  MEWS Pulse 1  MEWS RR 0  MEWS LOC 0  MEWS Score 2  MEWS Score Color Yellow  Assess: if the MEWS score is Yellow or Red  Were vital signs taken at a resting state? Yes  Focused Assessment Documented focused assessment  MEWS guidelines implemented *See Row Information* Yes  Take Vital Signs  Increase Vital Sign Frequency  Yellow: Q 2hr X 2 then Q 4hr X 2, if remains yellow, continue Q 4hrs  Escalate  MEWS: Escalate Yellow: discuss with charge nurse/RN and consider discussing with provider and RRT  Notify: Charge Nurse/RN  Name of Charge Nurse/RN Notified Ileana Roup, RN  Date Charge Nurse/RN Notified 08/11/19  Time Charge Nurse/RN Notified 0438  Notify: Provider  Provider Name/Title Dr. Myna Hidalgo  Date Provider Notified 08/11/19  Time Provider Notified [phone]  Notification Type Page  Notification Reason  (Change in vitals)

## 2019-08-11 NOTE — Discharge Summary (Signed)
Physician Discharge Summary  Natalie Tanner SPQ:[id number] DOB: [birth year] DOA: 08/09/2019  PCP: Natalie Fraise, MD  Admit date: 08/09/2019 Discharge date: 08/11/2019  Admitted From: Home Disposition: Home  Recommendations for Outpatient Follow-up:  1. Follow up with PCP in 1-2 weeks 2. Please obtain BMP/CBC in one week  Home Health: Equipment/Devices:  Discharge Condition: Stable CODE STATUS: Full code Diet recommendation: Heart healthy, carb modified  Brief/Interim Summary: HPI: Natalie Tanner is a 59 y.o. female with medical history significant of coronary artery disease, kidney stones, stroke, diabetes who presents with back pain. Patient reports cute on chronic back pain. She reports she has had issues with sciatica ever since having her hip replaced. She describes back pain that radiates into her left hip and down the posterior aspect of her left leg into her knee. She states it is worse with prolonged sitting and lifting. She states she takes gabapentin with minimal relief. She is also tried anti-inflammatories with some relief. Reports that she has taken up to 800 mg of ibuprofen twice daily.She denies any weakness, numbness, tingling, bowel or bladder difficulty. No fevers. Patient reports that she was at work tonight and had acute exacerbation of pain while lifting some heavy boxes. She is not had any abdominal pain. She states that laying still she has 0 out of 10 pain however, if she were to sit for prolonged period of time or ambulate, pain with increased to 7 out of 10. She does admit to being dehydrated.  Discharge Diagnoses:  Active Problems:   Mixed hyperlipidemia   Essential hypertension, benign   Type 2 diabetes mellitus with vascular disease ([place])   AKI (acute kidney injury) ([place])  1. Acute kidney injury.  Patient presented with a creatinine of 6.  She was noted to be hypotensive on arrival.  She is currently on ACE inhibitor which was held on admission.  She  was also taking Celebrex prior to admission.  Patient was aggressively hydrated with IV fluids.  Imaging did not indicate any hydronephrosis.  With IV fluids, her renal function has significantly improved.  Creatinine is down to 1.8.  The patient does report being dehydrated prior to admission.  She works in a [employer] and is exposed to prolonged periods of heat.  She admits that she was adequately hydrating herself.  She has been advised to continue hydration. 2. Diabetes.  Continue to hold Jardiance.  Januvia has been restarted at lower dose.  She is continued on Glucotrol and Actos.  Blood sugars have been stable. 3. Hypertension.  ACE inhibitor has been held until she can follow-up with her primary care physician. 4. Hyperlipidemia.  Continue home medications.  Discharge Instructions  Discharge Instructions    Diet - low sodium heart healthy   Complete by: As directed    Increase activity slowly   Complete by: As directed      Allergies as of 08/11/2019   No Known Allergies     Medication List    STOP taking these medications   celecoxib 200 MG capsule Commonly known as: CeleBREX   Jardiance 25 MG Tabs tablet Generic drug: empagliflozin   lisinopril 20 MG tablet Commonly known as: ZESTRIL     TAKE these medications   ALPRAZolam 1 MG tablet Commonly known as: XANAX Take 1 tablet (1 mg total) by mouth 2 (two) times daily as needed.   aspirin 325 MG EC tablet Take 1 tablet (325 mg total) by mouth daily with breakfast.   blood glucose meter kit and  supplies Kit Dispense based on patient and insurance preference. Use up to four times daily as directed. For diabetes E11.9, with hyperglycemia.   docusate sodium 100 MG capsule Commonly known as: COLACE Take 1 capsule (100 mg total) by mouth 2 (two) times daily.   ferrous sulfate 325 (65 FE) MG tablet Take 1 tablet (325 mg total) by mouth 3 (three) times daily after meals.   gabapentin 300 MG capsule Commonly known as:  NEURONTIN Take 1 capsule (300 mg total) by mouth 3 (three) times daily. What changed: additional instructions   glipiZIDE 10 MG tablet Commonly known as: GLUCOTROL Take 1 tablet (10 mg total) by mouth daily.   meclizine 25 MG tablet Commonly known as: ANTIVERT Take 1 tablet (25 mg total) by mouth 3 (three) times daily as needed for dizziness.   methocarbamol 500 MG tablet Commonly known as: ROBAXIN TAKE ONE TABLET BY MOUTH EVERY 8 HOURS AS NEEDED FOR MUSCLE SPASMS.   metoprolol tartrate 25 MG tablet Commonly known as: LOPRESSOR Take 0.5 tablets (12.5 mg total) by mouth 2 (two) times daily.   nitroGLYCERIN 0.4 MG SL tablet Commonly known as: Nitrostat Place 1 tablet (0.4 mg total) under the tongue every 5 (five) minutes as needed for chest pain.   omeprazole 20 MG tablet Commonly known as: PRILOSEC OTC Take 1 tablet (20 mg total) by mouth daily.   PARoxetine 20 MG tablet Commonly known as: PAXIL Take 1 tablet (20 mg total) by mouth every morning.   pioglitazone 30 MG tablet Commonly known as: Actos Take 1 tablet (30 mg total) by mouth daily.   rosuvastatin 10 MG tablet Commonly known as: CRESTOR Take 1 tablet (10 mg total) by mouth daily.   sitaGLIPtin 25 MG tablet Commonly known as: JANUVIA Take 1 tablet (25 mg total) by mouth daily. What changed:   medication strength  how much to take       No Known Allergies  Consultations:     Procedures/Studies: CT ABDOMEN PELVIS WO CONTRAST  Result Date: 08/10/2019 CLINICAL DATA:  Mid back pain, onset today EXAM: CT ABDOMEN AND PELVIS WITHOUT CONTRAST TECHNIQUE: Multidetector CT imaging of the abdomen and pelvis was performed following the standard protocol without IV contrast. COMPARISON:  Hip radiograph 05/07/2019, 05/20/2019, CT chest, abdomen and pelvis 09/23/2017 (report only) FINDINGS: Lower chest: Lung bases are clear. Normal heart size. No pericardial effusion. Hepatobiliary: No focal liver abnormality is  seen on this noncontrast CT exam. Smooth liver surface contour. Hepatic attenuation within normal limits. Few punctate calcifications anterior to the liver (2/30) and along the falciform ligament (2/20), nonspecific but almost certainly benign. No gallstones, gallbladder wall thickening, or biliary dilatation. Pancreas: Calcification in body of the pancreas (2/23) could reflect sequela of prior pancreatitis. Pancreas otherwise unremarkable without peripancreatic inflammation or ductal dilatation. Spleen: Normal in size without focal abnormality. Adrenals/Urinary Tract: Normal adrenal glands lobular contours of both kidneys likely reflect areas of chronic scarring, right greater than left. No visible or discernible contour deforming renal lesion. Mild bilateral symmetric perinephric stranding, a nonspecific finding though may correlate with either age or decreased renal function. Focal dilatation of the right upper pole calyx without other calyceal dilatation or frank hydronephrosis. Slight asymmetric right ureterectasis as well. No obstructing calculus along the right urinary tract. Left urinary tract caliber is more normal in appearance. Bilateral nonobstructing calculi present in both kidneys. Urinary bladder is largely decompressed at the time of exam and therefore poorly evaluated by CT imaging. Bladder also partially obscured by  streak artifact from left hip prosthesis. No visible bladder calculi or debris. Stomach/Bowel: Distal esophagus, stomach and duodenal sweep are unremarkable. No small bowel wall thickening or dilatation. No evidence of obstruction. A normal appendix is visualized. No colonic dilatation or wall thickening. Scattered colonic diverticula without focal pericolonic inflammation to suggest diverticulitis. Vascular/Lymphatic: Atherosclerotic calcifications throughout the abdominal aorta and branch vessels. No aneurysm or ectasia. No enlarged abdominopelvic lymph nodes. Reproductive:  Retroverted, slightly retroflexed uterus. No concerning adnexal lesions. Other: No abdominopelvic free fluid or free gas. No bowel containing hernias. Postsurgical changes along the left anterolateral thigh. Musculoskeletal: Postsurgical changes from prior left hip arthroplasty no acute hardware complication. No acute osseous abnormality or suspicious osseous lesion. Multilevel degenerative changes are present in the imaged portions of the spine. Features most pronounced L4-5. At least mild to moderate bilateral foraminal narrowing L4-5 with left L5 pars defect. Additional degenerative changes in the SI joints and right hip. IMPRESSION: 1. Focal dilatation of the right upper pole calyx without other calyceal dilatation or frank hydronephrosis. Slight asymmetric right ureterectasis as well. No obstructing calculus along the right urinary tract. Findings may represent sequela of a recently passed stone or ascending infection. Correlate with urinalysis. 2. Additional bilateral nonobstructing nephrolithiasis. 3. Colonic diverticulosis without evidence of diverticulitis. 4. Degenerative changes in the spine maximal L4-5 with left L5 pars defect and mild to moderate bilateral foraminal narrowing. 5. Aortic Atherosclerosis (ICD10-I70.0). Electronically Signed   By: Lovena Le M.D.   On: 08/10/2019 02:57   US RENAL  Result Date: 08/10/2019 CLINICAL DATA:  Acute renal insufficiency. EXAM: RENAL / URINARY TRACT ULTRASOUND COMPLETE COMPARISON:  CT of same date FINDINGS: Right Kidney: Renal measurements: 12.4 x 6.1 x 6.1 cm = volume: 240 mL. Normal echogenicity. Renal cortical thinning. No hydronephrosis. Left Kidney: Renal measurements: 13.8 x 6.3 x 6.4 cm = volume: 290 mL. Normal echogenicity. No hydronephrosis. Bladder: Appears normal for degree of bladder distention. Other: None. IMPRESSION: No hydronephrosis.  Right renal cortical thinning. Electronically Signed   By: Abigail Miyamoto M.D.   On: 08/10/2019 09:51        Subjective:   Discharge Exam: Vitals:   08/10/19 2052 08/10/19 2352 08/11/19 0425 08/11/19 0617  BP: 103/65 120/68 (!) 95/59 (!) 114/58  Pulse: 77 89 (!) 103 97  Resp: _0 Temp: 98.4 F (36.9 C) 97.8 F (36.6 C) 98.9 F (37.2 C) 98.1 F (36.7 C)  TempSrc: Oral Oral Oral Oral  SpO2: 97% 98% 93% 91%  Weight:      Height:        General: Pt is alert, awake, not in acute distress Cardiovascular: RRR, S1/S2 +, no rubs, no gallops Respiratory: CTA bilaterally, no wheezing, no rhonchi Abdominal: Soft, NT, ND, bowel sounds + Extremities: no edema, no cyanosis    The results of significant diagnostics from this hospitalization (including imaging, microbiology, ancillary and laboratory) are listed below for reference.     Microbiology: Recent Results (from the past 240 hour(s))  Urine culture     Status: None   Collection Time: 08/10/19  3:08 AM   Specimen: Urine, Clean Catch  Result Value Ref Range Status   Specimen Description   Final    URINE, CLEAN CATCH Performed at [facility], [address] [place], [place] [address]    Special Requests   Final    NONE Performed at [facility], [address] [place], [place] [address]    Culture   Final  NO GROWTH Performed at [facility], [place] [address], [place], [place] [address]    Report Status 08/11/2019 FINAL  Final  SARS Coronavirus 2 by RT PCR (hospital order, performed in [facility] hospital lab) Nasopharyngeal Nasopharyngeal Swab     Status: None   Collection Time: 08/10/19  5:24 AM   Specimen: Nasopharyngeal Swab  Result Value Ref Range Status   SARS Coronavirus 2 NEGATIVE NEGATIVE Final    Comment: (NOTE) SARS-CoV-2 target nucleic acids are NOT DETECTED. The SARS-CoV-2 RNA is generally detectable in upper and lower respiratory specimens during the acute phase of infection. The lowest concentration of SARS-CoV-2 viral copies this assay can detect is 250 copies / mL. A negative result  does not preclude SARS-CoV-2 infection and should not be used as the sole basis for treatment or other patient management decisions.  A negative result may occur with improper specimen collection / handling, submission of specimen other than nasopharyngeal swab, presence of viral mutation(s) within the areas targeted by this assay, and inadequate number of viral copies (<250 copies / mL). A negative result must be combined with clinical observations, patient history, and epidemiological information. Fact Sheet for Patients:   StrictlyIdeas.no Fact Sheet for Healthcare Providers: BankingDealers.co.za This test is not yet approved or cleared  by the [place] FDA and has been authorized for detection and/or diagnosis of SARS-CoV-2 by FDA under an Emergency Use Authorization (EUA).  This EUA will remain in effect (meaning this test can be used) for the duration of the COVID-19 declaration under Section 564(b)(1) of the Act, 21 U.S.C. section 360bbb-3(b)(1), unless the authorization is terminated or revoked sooner. Performed at [facility], [address], [place], [place] [address]      Labs: BNP (last 3 results) No results for input(s): BNP in the last 8760 hours. Basic Metabolic Panel: Recent Labs  Lab 08/10/19 0109 08/10/19 0324 08/10/19 0950 08/11/19 0631  NA 137 139 138 139  K 4.1 3.6 3.7 3.9  CL 101 103 104 110  CO2 21* 18* 20* 21*  GLUCOSE 138* 95 142* 114*  BUN 55* 55* 55* 37*  CREATININE 6.11* 5.40* 4.73* 1.87*  CALCIUM 8.8* 8.6* 8.3* 8.6*   Liver Function Tests: No results for input(s): AST, ALT, ALKPHOS, BILITOT, PROT, ALBUMIN in the last 168 hours. No results for input(s): LIPASE, AMYLASE in the last 168 hours. No results for input(s): AMMONIA in the last 168 hours. CBC: Recent Labs  Lab 08/10/19 0109  WBC 10.5  NEUTROABS 7.0  HGB 13.9  HCT 42.1  MCV 94.2  PLT 224   Cardiac Enzymes: No results for  input(s): CKTOTAL, CKMB, CKMBINDEX, TROPONINI in the last 168 hours. BNP: Invalid input(s): POCBNP CBG: Recent Labs  Lab 08/10/19 1147 08/10/19 1714 08/10/19 2144 08/11/19 0757 08/11/19 1145  GLUCAP 175* 172* 86 104* 148*   D-Dimer No results for input(s): DDIMER in the last 72 hours. Hgb A1c No results for input(s): HGBA1C in the last 72 hours. Lipid Profile No results for input(s): CHOL, HDL, LDLCALC, TRIG, CHOLHDL, LDLDIRECT in the last 72 hours. Thyroid function studies No results for input(s): TSH, T4TOTAL, T3FREE, THYROIDAB in the last 72 hours.  Invalid input(s): FREET3 Anemia work up No results for input(s): VITAMINB12, FOLATE, FERRITIN, TIBC, IRON, RETICCTPCT in the last 72 hours. Urinalysis    Component Value Date/Time   COLORURINE AMBER (A) 08/10/2019 0033   APPEARANCEUR CLOUDY (A) 08/10/2019 0033   LABSPEC 1.023 08/10/2019 0033   PHURINE 5.0 08/10/2019 0033  GLUCOSEU NEGATIVE 08/10/2019 0033   HGBUR NEGATIVE 08/10/2019 0033   BILIRUBINUR SMALL (A) 08/10/2019 0033   KETONESUR NEGATIVE 08/10/2019 0033   PROTEINUR 100 (A) 08/10/2019 0033   NITRITE NEGATIVE 08/10/2019 0033   LEUKOCYTESUR SMALL (A) 08/10/2019 0033   Sepsis Labs Invalid input(s): PROCALCITONIN,  WBC,  LACTICIDVEN Microbiology Recent Results (from the past 240 hour(s))  Urine culture     Status: None   Collection Time: 08/10/19  3:08 AM   Specimen: Urine, Clean Catch  Result Value Ref Range Status   Specimen Description   Final    URINE, CLEAN CATCH Performed at [facility], [address], [place], [place] [address]    Special Requests   Final    NONE Performed at [facility], [address], [place], [place] [address]    Culture   Final    NO GROWTH Performed at [facility], [place] [address] [place], [place] [address]    Report Status 08/11/2019 FINAL  Final  SARS Coronavirus 2 by RT PCR (hospital order, performed in [facility] hospital lab) Nasopharyngeal Nasopharyngeal  Swab     Status: None   Collection Time: 08/10/19  5:24 AM   Specimen: Nasopharyngeal Swab  Result Value Ref Range Status   SARS Coronavirus 2 NEGATIVE NEGATIVE Final    Comment: (NOTE) SARS-CoV-2 target nucleic acids are NOT DETECTED. The SARS-CoV-2 RNA is generally detectable in upper and lower respiratory specimens during the acute phase of infection. The lowest concentration of SARS-CoV-2 viral copies this assay can detect is 250 copies / mL. A negative result does not preclude SARS-CoV-2 infection and should not be used as the sole basis for treatment or other patient management decisions.  A negative result may occur with improper specimen collection / handling, submission of specimen other than nasopharyngeal swab, presence of viral mutation(s) within the areas targeted by this assay, and inadequate number of viral copies (<250 copies / mL). A negative result must be combined with clinical observations, patient history, and epidemiological information. Fact Sheet for Patients:   StrictlyIdeas.no Fact Sheet for Healthcare Providers: BankingDealers.co.za This test is not yet approved or cleared  by the [place] FDA and has been authorized for detection and/or diagnosis of SARS-CoV-2 by FDA under an Emergency Use Authorization (EUA).  This EUA will remain in effect (meaning this test can be used) for the duration of the COVID-19 declaration under Section 564(b)(1) of the Act, 21 U.S.C. section 360bbb-3(b)(1), unless the authorization is terminated or revoked sooner. Performed at [facility], [address] [place], [place] [address]      Time coordinating discharge: 36mns  SIGNED:   JKathie Dike MD  [facility] 08/11/2019, 7:04 PM   If 7PM-7AM, please contact night-coverage [email or web address]

## 2019-08-16 ENCOUNTER — Telehealth: Payer: Self-pay | Admitting: Family Medicine

## 2019-08-16 NOTE — Telephone Encounter (Signed)
Per hospital discharge summary patient is to follow up with PCP 1-2 weeks.  Appointment scheduled with Dr. Livia Snellen.

## 2019-08-23 ENCOUNTER — Encounter: Payer: Self-pay | Admitting: Cardiology

## 2019-08-23 ENCOUNTER — Telehealth (INDEPENDENT_AMBULATORY_CARE_PROVIDER_SITE_OTHER): Admitting: Cardiology

## 2019-08-23 VITALS — Ht 63.0 in | Wt 206.0 lb

## 2019-08-23 DIAGNOSIS — I25119 Atherosclerotic heart disease of native coronary artery with unspecified angina pectoris: Secondary | ICD-10-CM | POA: Diagnosis not present

## 2019-08-23 DIAGNOSIS — Z87448 Personal history of other diseases of urinary system: Secondary | ICD-10-CM | POA: Diagnosis not present

## 2019-08-23 DIAGNOSIS — E782 Mixed hyperlipidemia: Secondary | ICD-10-CM

## 2019-08-23 NOTE — Progress Notes (Signed)
Virtual Visit via Telephone Note   This visit type was conducted due to national recommendations for restrictions regarding the COVID-19 Pandemic (e.g. social distancing) in an effort to limit this patient's exposure and mitigate transmission in our community.  Due to her co-morbid illnesses, this patient is at least at moderate risk for complications without adequate follow up.  This format is felt to be most appropriate for this patient at this time.  The patient did not have access to video technology/had technical difficulties with video requiring transitioning to audio format only (telephone).  All issues noted in this document were discussed and addressed.  No physical exam could be performed with this format.  Please refer to the patient's chart for her  consent to telehealth for [facility].   The patient was identified using 2 identifiers.  Date:  08/23/2019   ID:  Natalie Tanner, DOB 29-Jun-1960, MRN [id number]  Patient Location: Home Provider Location: Office  PCP:  Claretta Fraise, MD  Cardiologist:  Rozann Lesches, MD Electrophysiologist:  None   Evaluation Performed:  Follow-Up Visit  Chief Complaint:  Cardiac follow-up  History of Present Illness:    Natalie Tanner is a 59 y.o. female that I have not seen since 2014.  Interval visit with Dr. Gardiner Rhyme noted in March of this year for preoperative cardiac consultation prior to hip surgery.  I reviewed recent records, she was hospitalized in early June with acute kidney failure, creatinine up to 6.11 in the setting of ACE inhibitor, Celebrex, and relative hypotension.  It was felt that she could have potentially been dehydrated as well.  With discontinuation of these medications and associated hydration her creatinine improved to 1.87.  We spoke by phone today.  She has a pending visit to see Dr. Livia Snellen on Wednesday.  She states that she resumed all the medications that were discontinued and has remained on  these since discharge including lisinopril.  Cardiac history includes medically managed NSTEMI in May 2012.  Cardiac catheterization at that time revealed branch vessel and small vessel disease that was not felt to be amenable to revascularization.  She does not report any accelerating angina at this time and remains on aspirin, beta-blocker, and Crestor.  She is no longer on Imdur.  I reviewed her recent lab work, LDL was 95, down from 139 within the last 6 months.   Past Medical History:  Diagnosis Date  . Acute renal failure (ARF) [facility])    June 2021 - taken off ACE inhibitor and Celebrex  . Anxiety   . Coronary atherosclerosis of native coronary artery    Managed medically following cardiac catheterization 5/12  . Depression   . Essential hypertension   . GERD (gastroesophageal reflux disease)   . History of kidney stones   . Iron deficiency anemia    Secondary to menometrorrhagia  . Mixed hyperlipidemia   . Myocardial infarction [facility])    NSTEMI 5/12  . Nephrolithiasis   . Stroke ([place]) 2017  . Type 2 diabetes mellitus ([place])    Past Surgical History:  Procedure Laterality Date  . BACK SURGERY    . KNEE SURGERY    . SINUS SURGERY WITH INSTATRAK    . TOTAL HIP ARTHROPLASTY Left 05/07/2019   Procedure: LEFT TOTAL HIP ARTHROPLASTY DIRECT ANTERIOR;  Surgeon: Marybelle Killings, MD;  Location: [place];  Service: Orthopedics;  Laterality: Left;     Current Meds  Medication Sig  . ALPRAZolam (XANAX) 1 MG tablet Take 1 tablet (  1 mg total) by mouth 2 (two) times daily as needed.  Marland Kitchen aspirin EC 81 MG tablet Take 81 mg by mouth daily. Swallow whole.  . blood glucose meter kit and supplies KIT Dispense based on patient and insurance preference. Use up to four times daily as directed. For diabetes E11.9, with hyperglycemia.  . celecoxib (CELEBREX) 50 MG capsule Take 50 mg by mouth daily.  Marland Kitchen docusate sodium (COLACE) 100 MG capsule Take 1 capsule (100 mg total) by mouth 2 (two) times daily.  .  ferrous sulfate 325 (65 FE) MG tablet Take 1 tablet (325 mg total) by mouth 3 (three) times daily after meals.  . gabapentin (NEURONTIN) 300 MG capsule Take 1 capsule (300 mg total) by mouth 3 (three) times daily. (Patient taking differently: Take 300 mg by mouth 3 (three) times daily. Pt states takes bid)  . glipiZIDE (GLUCOTROL) 10 MG tablet Take 1 tablet (10 mg total) by mouth daily.  . meclizine (ANTIVERT) 25 MG tablet Take 1 tablet (25 mg total) by mouth 3 (three) times daily as needed for dizziness.  . metoprolol tartrate (LOPRESSOR) 25 MG tablet Take 0.5 tablets (12.5 mg total) by mouth 2 (two) times daily.  . nitroGLYCERIN (NITROSTAT) 0.4 MG SL tablet Place 1 tablet (0.4 mg total) under the tongue every 5 (five) minutes as needed for chest pain.  Marland Kitchen omeprazole (PRILOSEC OTC) 20 MG tablet Take 1 tablet (20 mg total) by mouth daily.  Marland Kitchen PARoxetine (PAXIL) 20 MG tablet Take 1 tablet (20 mg total) by mouth every morning.  . pioglitazone (ACTOS) 30 MG tablet Take 1 tablet (30 mg total) by mouth daily.  . rosuvastatin (CRESTOR) 10 MG tablet Take 1 tablet (10 mg total) by mouth daily.  . sitaGLIPtin (JANUVIA) 25 MG tablet Take 1 tablet (25 mg total) by mouth daily.  . [DISCONTINUED] aspirin EC 325 MG EC tablet Take 1 tablet (325 mg total) by mouth daily with breakfast.  . [DISCONTINUED] methocarbamol (ROBAXIN) 500 MG tablet TAKE ONE TABLET BY MOUTH EVERY 8 HOURS AS NEEDED FOR MUSCLE SPASMS.     Allergies:   Patient has no known allergies.   ROS:   No palpitations or syncope.   Prior CV studies:   The following studies were reviewed today:  No interval cardiac testing for review.  Labs/Other Tests and Data Reviewed:    EKG:  An ECG dated 05/03/2019 was personally reviewed today and demonstrated:  Normal sinus rhythm.  Recent Labs: 03/19/2019: TSH 4.930 05/09/2019: Magnesium 1.4 07/14/2019: ALT 17 08/10/2019: Hemoglobin 13.9; Platelets 224 08/11/2019: BUN 37; Creatinine, Ser 1.87; Potassium 3.9;  Sodium 139   Recent Lipid Panel Lab Results  Component Value Date/Time   CHOL 191 07/14/2019 10:53 AM   TRIG 157 (H) 07/14/2019 10:53 AM   HDL 69 07/14/2019 10:53 AM   CHOLHDL 2.8 07/14/2019 10:53 AM   CHOLHDL 2.4 05/09/2019 04:42 AM   LDLCALC 95 07/14/2019 10:53 AM    Wt Readings from Last 3 Encounters:  08/23/19 206 lb (93.4 kg)  08/10/19 210 lb 1.6 oz (95.3 kg)  07/14/19 212 lb 6 oz (96.3 kg)     Objective:    Vital Signs:  Ht '5\' 3"'$  (1.6 m)   Wt 206 lb (93.4 kg)   LMP 07/05/2010   BMI 36.49 kg/m    Unable to obtain vital signs today. Patient spoke in full sentences, not short of breath.  ASSESSMENT & PLAN:    1.  Multivessel CAD, branch vessel and small vessel  disease as of 2012, managed medically.  She does not describe any accelerating angina.  I reviewed her recent ECG from March.  Decrease aspirin to 81 mg daily, continue Lopressor and Crestor which she is tolerating.  Arrange regular cardiology follow-up.  2.  Recent acute renal failure with creatinine up to 6.11 in the setting of suspected dehydration with relative hypotension, ACE inhibitor, and Celebrex.  She was apparently also on Jardiance although I do not see this on her list.  She has a pending office visit with Dr. Livia Snellen on Wednesday, would recommend follow-up BMET at that time to help guide further medication adjustments.  I did ask her to hold lisinopril in the interim (she resume this after hospital stay).  3.  Mixed hyperlipidemia, tolerating Crestor.  LDL has come down to 95 from 139.  May be able to continue up-titration versus addition of Zetia if numbers do not continue to improve.  Time:   Today, I have spent 8 minutes with the patient with telehealth technology discussing the above problems.     Medication Adjustments/Labs and Tests Ordered: Current medicines are reviewed at length with the patient today.  Concerns regarding medicines are outlined above.   Tests Ordered: No orders of the  defined types were placed in this encounter.   Medication Changes: No orders of the defined types were placed in this encounter.   Follow Up:  In Person 6 months in the [place] office.  Signed, Rozann Lesches, MD  08/23/2019 10:49 AM    [place]

## 2019-08-23 NOTE — Patient Instructions (Signed)
Your physician wants you to follow-up in: [place] will receive a reminder letter in the mail two months in advance. If you don't receive a letter, please call our office to schedule the follow-up appointment.  Your physician has recommended you make the following change in your medication:   DECREASE ASPRIN 81 MG DAILY   Thank you for choosing [facility]!!

## 2019-08-25 ENCOUNTER — Ambulatory Visit (INDEPENDENT_AMBULATORY_CARE_PROVIDER_SITE_OTHER): Admitting: Family Medicine

## 2019-08-25 ENCOUNTER — Other Ambulatory Visit: Payer: Self-pay

## 2019-08-25 ENCOUNTER — Encounter: Payer: Self-pay | Admitting: Family Medicine

## 2019-08-25 VITALS — BP 156/85 | HR 67 | Temp 97.0°F | Resp 20 | Ht 63.0 in | Wt 208.1 lb

## 2019-08-25 DIAGNOSIS — M159 Polyosteoarthritis, unspecified: Secondary | ICD-10-CM

## 2019-08-25 DIAGNOSIS — M8949 Other hypertrophic osteoarthropathy, multiple sites: Secondary | ICD-10-CM | POA: Diagnosis not present

## 2019-08-25 DIAGNOSIS — N179 Acute kidney failure, unspecified: Secondary | ICD-10-CM | POA: Diagnosis not present

## 2019-08-25 DIAGNOSIS — E1121 Type 2 diabetes mellitus with diabetic nephropathy: Secondary | ICD-10-CM | POA: Diagnosis not present

## 2019-08-25 MED ORDER — CELECOXIB 200 MG PO CAPS: 200.0000 mg | ORAL_CAPSULE | Freq: Every day | ORAL | 5 refills | Status: DC

## 2019-08-25 MED ORDER — AMLODIPINE BESYLATE 5 MG PO TABS: 5.0000 mg | ORAL_TABLET | Freq: Every day | ORAL | 5 refills | Status: DC

## 2019-08-25 NOTE — Progress Notes (Signed)
Subjective:  Patient ID: Natalie Tanner, female    DOB: April 23, 1960  Age: 59 y.o. MRN: [id number]  CC: Hospitalization Follow-up   HPI Natalie Tanner presents forFollow-up of diabetes.  Currently she is not check her blood sugars outside the office. Patient denies symptoms such as polyuria, polydipsia, excessive hunger, nausea No significant hypoglycemic spells noted. Medications reviewed. Pt reports taking them regularly without complication/adverse reaction being reported today.   Recently the patient was hospitalized from June 7 till June 9.  She had acute kidney injury and a creatinine that climbed to 6.  She was hypotensive on admission.  There was concern that the recently started Celebrex might have contributed to the kidney injury.  She was combining that with so than ACE inhibitor.  At this time she says she cannot do without the Celebrex because it gave her such good pain relief.  She suffers from a great deal of arthritic pain.  She would like to find a way to continue that medication if at all possible.  She understands that water intake has a significant impact on kidney function and is willing to increase the amount of water she drinks daily.  Additionally she has been taking Jardiance, it is in a group of medicines that is been of concern for causing renal issues.   History Enrika has a past medical history of Acute renal failure (ARF) ([place]), Anxiety, Coronary atherosclerosis of native coronary artery, Depression, Essential hypertension, GERD (gastroesophageal reflux disease), History of kidney stones, Iron deficiency anemia, Mixed hyperlipidemia, Myocardial infarction [facility]), Nephrolithiasis, Stroke ([place]) (2017), and Type 2 diabetes mellitus ([place]).   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   Her family history includes Other in her father; Stroke in her  father.She reports that she quit smoking about 18 years ago. Her smoking use included cigarettes. She has a 20.00 pack-year smoking history. She has never used smokeless tobacco. She reports that she does not drink alcohol and does not use drugs.  Current Outpatient Medications on File Prior to Visit  Medication Sig Dispense Refill  . ALPRAZolam (XANAX) 1 MG tablet Take 1 tablet (1 mg total) by mouth 2 (two) times daily as needed. 180 tablet 1  . aspirin EC 81 MG tablet Take 81 mg by mouth daily. Swallow whole.    . blood glucose meter kit and supplies KIT Dispense based on patient and insurance preference. Use up to four times daily as directed. For diabetes E11.9, with hyperglycemia. 90 each 11  . ferrous sulfate 325 (65 FE) MG tablet Take 1 tablet (325 mg total) by mouth 3 (three) times daily after meals. 90 tablet 1  . gabapentin (NEURONTIN) 300 MG capsule Take 1 capsule (300 mg total) by mouth 3 (three) times daily. (Patient taking differently: Take 300 mg by mouth 3 (three) times daily. Pt states takes bid) 90 capsule 3  . glipiZIDE (GLUCOTROL) 10 MG tablet Take 1 tablet (10 mg total) by mouth daily. 90 tablet 3  . meclizine (ANTIVERT) 25 MG tablet Take 1 tablet (25 mg total) by mouth 3 (three) times daily as needed for dizziness. 90 tablet 11  . metoprolol tartrate (LOPRESSOR) 25 MG tablet Take 0.5 tablets (12.5 mg total) by mouth 2 (two) times daily. 180 tablet 11  . nitroGLYCERIN (NITROSTAT) 0.4 MG SL tablet Place 1 tablet (0.4 mg total) under the tongue every 5 (five) minutes as needed for chest  pain. 25 tablet 2  . omeprazole (PRILOSEC OTC) 20 MG tablet Take 1 tablet (20 mg total) by mouth daily. 90 tablet 3  . PARoxetine (PAXIL) 20 MG tablet Take 1 tablet (20 mg total) by mouth every morning. 90 tablet 3  . pioglitazone (ACTOS) 30 MG tablet Take 1 tablet (30 mg total) by mouth daily. 90 tablet 1  . rosuvastatin (CRESTOR) 10 MG tablet Take 1 tablet (10 mg total) by mouth daily. 90 tablet 3   . sitaGLIPtin (JANUVIA) 25 MG tablet Take 1 tablet (25 mg total) by mouth daily. 30 tablet 0  . docusate sodium (COLACE) 100 MG capsule Take 1 capsule (100 mg total) by mouth 2 (two) times daily. (Patient not taking: Reported on 08/25/2019) 10 capsule 0   No current facility-administered medications on file prior to visit.    ROS Review of Systems  Constitutional: Negative.   HENT: Negative for congestion.   Eyes: Negative for visual disturbance.  Respiratory: Negative for shortness of breath.   Cardiovascular: Negative for chest pain.  Gastrointestinal: Negative for abdominal pain, constipation, diarrhea, nausea and vomiting.  Genitourinary: Negative for difficulty urinating.  Musculoskeletal: Positive for arthralgias and myalgias.  Neurological: Negative for headaches.  Psychiatric/Behavioral: Negative for sleep disturbance.    Objective:  BP (!) 156/85   Pulse 67   Temp (!) 97 F (36.1 C) (Temporal)   Resp 20   Ht 5' 3" (1.6 m)   Wt 208 lb 2 oz (94.4 kg)   LMP 07/05/2010   SpO2 97%   BMI 36.87 kg/m   BP Readings from Last 3 Encounters:  08/25/19 (!) 156/85  08/11/19 (!) 114/58  07/15/19 124/78    Wt Readings from Last 3 Encounters:  08/25/19 208 lb 2 oz (94.4 kg)  08/23/19 206 lb (93.4 kg)  08/10/19 210 lb 1.6 oz (95.3 kg)     Physical Exam Constitutional:      General: She is not in acute distress.    Appearance: She is well-developed.  HENT:     Head: Normocephalic and atraumatic.  Eyes:     Conjunctiva/sclera: Conjunctivae normal.     Pupils: Pupils are equal, round, and reactive to light.  Neck:     Thyroid: No thyromegaly.  Cardiovascular:     Rate and Rhythm: Normal rate and regular rhythm.     Heart sounds: Normal heart sounds. No murmur heard.   Pulmonary:     Effort: Pulmonary effort is normal. No respiratory distress.     Breath sounds: Normal breath sounds. No wheezing or rales.  Abdominal:     General: Bowel sounds are normal. There is  no distension.     Palpations: Abdomen is soft.     Tenderness: There is no abdominal tenderness.  Musculoskeletal:        General: Normal range of motion.     Cervical back: Normal range of motion and neck supple.  Lymphadenopathy:     Cervical: No cervical adenopathy.  Skin:    General: Skin is warm and dry.  Neurological:     Mental Status: She is alert and oriented to person, place, and time.  Psychiatric:        Behavior: Behavior normal.        Thought Content: Thought content normal.        Judgment: Judgment normal.    Results for orders placed or performed in visit on 08/25/19  [facility]  Result Value Ref Range   Glucose 163 (H) 65 -  99 mg/dL   BUN 13 6 - 24 mg/dL   Creatinine, Ser 0.81 0.57 - 1.00 mg/dL   GFR calc non Af Amer 80 >59 mL/min/1.73   GFR calc Af Amer 93 >59 mL/min/1.73   BUN/Creatinine Ratio 16 9 - 23   Sodium 143 134 - 144 mmol/L   Potassium 4.2 3.5 - 5.2 mmol/L   Chloride 103 96 - 106 mmol/L   CO2 29 20 - 29 mmol/L   Calcium 9.3 8.7 - 10.2 mg/dL      Assessment & Plan:     I have discontinued Jonessa R. Connett's lisinopril and empagliflozin. I am also having her start on amLODipine. Additionally, I am having her maintain her meclizine, blood glucose meter kit and supplies, nitroGLYCERIN, pioglitazone, gabapentin, omeprazole, PARoxetine, glipiZIDE, rosuvastatin, docusate sodium, ferrous sulfate, metoprolol tartrate, ALPRAZolam, sitaGLIPtin, aspirin EC, and celecoxib.  Meds ordered this encounter  Medications  . celecoxib (CELEBREX) 200 MG capsule    Sig: Take 1 capsule (200 mg total) by mouth daily. With food    Dispense:  30 capsule    Refill:  5  . amLODipine (NORVASC) 5 MG tablet    Sig: Take 1 tablet (5 mg total) by mouth daily. For blood pressure    Dispense:  30 tablet    Refill:  5   We discussed in detail the need to stay hydrated.  I suggested for half liter bottles of water daily would be a minimum.  Since she was so insistent on  trying the Celebrex again, I discontinued the lisinopril and Jardiance.  We will monitor her blood pressure and renal function closely.  Specifically if she starts swelling we will need to work on that.  Of note of course is that we substituted amlodipine for the lisinopril and some swelling may just be a result of that.  However she if she starts swelling we will need to do an immediate basic metabolic to check her kidney function.  Otherwise she is going to come in weekly for the next month to check her basic metabolic profile.  Follow-up: Return in about 1 month (around 09/24/2019).  Claretta Fraise, M.D.

## 2019-08-25 NOTE — Patient Instructions (Addendum)
Every time you take your Celebrex you must drink at least 4 bottles of water that day.  That is a minimum of 80 ounces of water.  It is in addition to all other fluids you might drink.  You must come in weekly for a kidney function blood test for at least the next month.

## 2019-08-26 LAB — BMP8+EGFR
BUN/Creatinine Ratio: 16 (ref 9–23)
BUN: 13 mg/dL (ref 6–24)
CO2: 29 mmol/L (ref 20–29)
Calcium: 9.3 mg/dL (ref 8.7–10.2)
Chloride: 103 mmol/L (ref 96–106)
Creatinine, Ser: 0.81 mg/dL (ref 0.57–1.00)
GFR calc Af Amer: 93 mL/min/{1.73_m2} (ref >59)
GFR calc non Af Amer: 80 mL/min/{1.73_m2} (ref >59)
Glucose: 163 mg/dL — ABNORMAL HIGH (ref 65–99)
Potassium: 4.2 mmol/L (ref 3.5–5.2)
Sodium: 143 mmol/L (ref 134–144)

## 2019-09-01 ENCOUNTER — Other Ambulatory Visit

## 2019-09-01 ENCOUNTER — Other Ambulatory Visit: Payer: Self-pay

## 2019-09-01 DIAGNOSIS — E1121 Type 2 diabetes mellitus with diabetic nephropathy: Secondary | ICD-10-CM

## 2019-09-01 DIAGNOSIS — M159 Polyosteoarthritis, unspecified: Secondary | ICD-10-CM

## 2019-09-02 LAB — BMP8+EGFR
BUN/Creatinine Ratio: 19 (ref 9–23)
BUN: 13 mg/dL (ref 6–24)
CO2: 25 mmol/L (ref 20–29)
Calcium: 9.3 mg/dL (ref 8.7–10.2)
Chloride: 101 mmol/L (ref 96–106)
Creatinine, Ser: 0.7 mg/dL (ref 0.57–1.00)
GFR calc Af Amer: 110 mL/min/{1.73_m2} (ref >59)
GFR calc non Af Amer: 96 mL/min/{1.73_m2} (ref >59)
Glucose: 214 mg/dL — ABNORMAL HIGH (ref 65–99)
Potassium: 3.8 mmol/L (ref 3.5–5.2)
Sodium: 141 mmol/L (ref 134–144)

## 2019-09-08 ENCOUNTER — Other Ambulatory Visit

## 2019-09-08 ENCOUNTER — Other Ambulatory Visit: Payer: Self-pay

## 2019-09-08 DIAGNOSIS — M150 Primary generalized (osteo)arthritis: Secondary | ICD-10-CM

## 2019-09-08 DIAGNOSIS — E1121 Type 2 diabetes mellitus with diabetic nephropathy: Secondary | ICD-10-CM

## 2019-09-08 DIAGNOSIS — M159 Polyosteoarthritis, unspecified: Secondary | ICD-10-CM

## 2019-09-09 LAB — BMP8+EGFR
BUN/Creatinine Ratio: 14 (ref 9–23)
BUN: 13 mg/dL (ref 6–24)
CO2: 26 mmol/L (ref 20–29)
Calcium: 9.4 mg/dL (ref 8.7–10.2)
Chloride: 100 mmol/L (ref 96–106)
Creatinine, Ser: 0.9 mg/dL (ref 0.57–1.00)
GFR calc Af Amer: 82 mL/min/{1.73_m2} (ref >59)
GFR calc non Af Amer: 71 mL/min/{1.73_m2} (ref >59)
Glucose: 231 mg/dL — ABNORMAL HIGH (ref 65–99)
Potassium: 4.3 mmol/L (ref 3.5–5.2)
Sodium: 138 mmol/L (ref 134–144)

## 2019-09-16 ENCOUNTER — Other Ambulatory Visit: Payer: Self-pay

## 2019-09-16 ENCOUNTER — Other Ambulatory Visit

## 2019-09-16 DIAGNOSIS — E1121 Type 2 diabetes mellitus with diabetic nephropathy: Secondary | ICD-10-CM

## 2019-09-16 DIAGNOSIS — M159 Polyosteoarthritis, unspecified: Secondary | ICD-10-CM

## 2019-09-17 LAB — BMP8+EGFR
BUN/Creatinine Ratio: 11 (ref 9–23)
BUN: 10 mg/dL (ref 6–24)
CO2: 24 mmol/L (ref 20–29)
Calcium: 9.5 mg/dL (ref 8.7–10.2)
Chloride: 98 mmol/L (ref 96–106)
Creatinine, Ser: 0.9 mg/dL (ref 0.57–1.00)
GFR calc Af Amer: 82 mL/min/{1.73_m2} (ref >59)
GFR calc non Af Amer: 71 mL/min/{1.73_m2} (ref >59)
Glucose: 224 mg/dL — ABNORMAL HIGH (ref 65–99)
Potassium: 4 mmol/L (ref 3.5–5.2)
Sodium: 138 mmol/L (ref 134–144)

## 2019-09-28 ENCOUNTER — Other Ambulatory Visit: Payer: Self-pay

## 2019-09-28 ENCOUNTER — Encounter: Payer: Self-pay | Admitting: Family Medicine

## 2019-09-28 ENCOUNTER — Ambulatory Visit (INDEPENDENT_AMBULATORY_CARE_PROVIDER_SITE_OTHER): Admitting: Family Medicine

## 2019-09-28 VITALS — BP 128/79 | HR 80 | Temp 98.0°F | Ht 63.0 in | Wt 206.2 lb

## 2019-09-28 DIAGNOSIS — M8949 Other hypertrophic osteoarthropathy, multiple sites: Secondary | ICD-10-CM

## 2019-09-28 DIAGNOSIS — E1165 Type 2 diabetes mellitus with hyperglycemia: Secondary | ICD-10-CM | POA: Diagnosis not present

## 2019-09-28 DIAGNOSIS — I10 Essential (primary) hypertension: Secondary | ICD-10-CM | POA: Diagnosis not present

## 2019-09-28 DIAGNOSIS — M159 Polyosteoarthritis, unspecified: Secondary | ICD-10-CM

## 2019-09-28 DIAGNOSIS — E1159 Type 2 diabetes mellitus with other circulatory complications: Secondary | ICD-10-CM

## 2019-09-28 MED ORDER — AMLODIPINE BESYLATE 5 MG PO TABS: 5.0000 mg | ORAL_TABLET | Freq: Every day | ORAL | 1 refills | Status: DC

## 2019-09-28 MED ORDER — PIOGLITAZONE HCL 30 MG PO TABS: 30.0000 mg | ORAL_TABLET | Freq: Every day | ORAL | 1 refills | Status: DC

## 2019-09-28 MED ORDER — CELECOXIB 200 MG PO CAPS: 200.0000 mg | ORAL_CAPSULE | Freq: Every day | ORAL | 1 refills | Status: DC

## 2019-09-28 MED ORDER — METOPROLOL TARTRATE 25 MG PO TABS: 12.5000 mg | ORAL_TABLET | Freq: Two times a day (BID) | ORAL | 1 refills | Status: DC

## 2019-09-28 MED ORDER — GLIPIZIDE 10 MG PO TABS: 10.0000 mg | ORAL_TABLET | Freq: Every day | ORAL | 3 refills | Status: DC

## 2019-09-28 NOTE — Progress Notes (Signed)
Subjective:  Patient ID: Natalie Tanner, female    DOB: [birth year]  Age: 59 y.o. MRN: [id number]  CC: Follow-up (1 month)   HPI Natalie Tanner presents forFollow-up of diabetes. Patient checks blood sugar at home.   200-250 fasting and not checking postprandial Patient denies symptoms such as polyuria, polydipsia, excessive hunger, nausea No significant hypoglycemic spells noted. Medications reviewed. Pt reports taking them regularly without complication/adverse reaction being reported today.  GAD 7 : Generalized Anxiety Score 09/28/2019 07/14/2019 03/19/2019  Nervous, Anxious, on Edge 1 1 0  Control/stop worrying 0 0 0  Worry too much - different things 0 0 0  Trouble relaxing _0 Restless 1 1 0  Easily annoyed or irritable _1 Afraid - awful might happen 1 1 0  Total GAD 7 Score _2 Anxiety Difficulty Somewhat difficult Very difficult Somewhat difficult    Depression screen [facility] 2/9 09/28/2019 08/25/2019 07/14/2019 04/16/2019 03/19/2019  Decreased Interest 0 0 0 0 0  Down, Depressed, Hopeless 0 1 0 0 0  PHQ - 2 Score 0 1 0 0 0  Altered sleeping - - - - -  Tired, decreased energy - - - - -  Change in appetite - - - - -  Feeling bad or failure about yourself  - - - - -  Trouble concentrating - - - - -  Moving slowly or fidgety/restless - - - - -  Suicidal thoughts - - - - -  PHQ-9 Score - - - - -  Difficult doing work/chores - - - - -  Some recent data might be hidden     History Natalie Tanner has a past medical history of Acute renal failure (ARF) ([place]), Anxiety, Coronary atherosclerosis of native coronary artery, Depression, Essential hypertension, GERD (gastroesophageal reflux disease), History of kidney stones, Iron deficiency anemia, Mixed hyperlipidemia, Myocardial infarction ([place]), Nephrolithiasis, Stroke ([place]) (2017), and Type 2 diabetes mellitus ([place]).   She has a past surgical history that includes Knee surgery; Sinus surgery with [employer]; Back surgery;  and Total hip arthroplasty (Left, 05/07/2019).   Her family history includes Other in her father; Stroke in her father.She reports that she quit smoking about 18 years ago. Her smoking use included cigarettes. She has a 20.00 pack-year smoking history. She has never used smokeless tobacco. She reports that she does not drink alcohol and does not use drugs.  Current Outpatient Medications on File Prior to Visit  Medication Sig Dispense Refill  . ALPRAZolam (XANAX) 1 MG tablet Take 1 tablet (1 mg total) by mouth 2 (two) times daily as needed. 180 tablet 1  . aspirin EC 81 MG tablet Take 81 mg by mouth daily. Swallow whole.    . blood glucose meter kit and supplies KIT Dispense based on patient and insurance preference. Use up to four times daily as directed. For diabetes E11.9, with hyperglycemia. 90 each 11  . docusate sodium (COLACE) 100 MG capsule Take 1 capsule (100 mg total) by mouth 2 (two) times daily. 10 capsule 0  . ferrous sulfate 325 (65 FE) MG tablet Take 1 tablet (325 mg total) by mouth 3 (three) times daily after meals. 90 tablet 1  . gabapentin (NEURONTIN) 300 MG capsule Take 1 capsule (300 mg total) by mouth 3 (three) times daily. (Patient taking differently: Take 300 mg by mouth 3 (three) times daily. Pt states takes bid) 90 capsule 3  . meclizine (ANTIVERT) 25 MG tablet Take 1 tablet (  25 mg total) by mouth 3 (three) times daily as needed for dizziness. 90 tablet 11  . nitroGLYCERIN (NITROSTAT) 0.4 MG SL tablet Place 1 tablet (0.4 mg total) under the tongue every 5 (five) minutes as needed for chest pain. 25 tablet 2  . omeprazole (PRILOSEC OTC) 20 MG tablet Take 1 tablet (20 mg total) by mouth daily. 90 tablet 3  . PARoxetine (PAXIL) 20 MG tablet Take 1 tablet (20 mg total) by mouth every morning. 90 tablet 3  . sitaGLIPtin (JANUVIA) 25 MG tablet Take 1 tablet (25 mg total) by mouth daily. 30 tablet 0  . rosuvastatin (CRESTOR) 10 MG tablet Take 1 tablet (10 mg total) by mouth daily. 90  tablet 3   No current facility-administered medications on file prior to visit.    ROS Review of Systems  Constitutional: Negative.   HENT: Negative.   Eyes: Negative for visual disturbance.  Respiratory: Negative for shortness of breath.   Cardiovascular: Negative for chest pain.  Gastrointestinal: Negative for abdominal pain.  Musculoskeletal: Negative for arthralgias.    Objective:  BP 128/79   Pulse 80   Temp 98 F (36.7 C) (Temporal)   Ht _0  (1.6 m)   Wt (!) 206 lb 3.2 oz (93.5 kg)   LMP 07/05/2010   BMI 36.53 kg/m   BP Readings from Last 3 Encounters:  09/28/19 128/79  08/25/19 (!) 156/85  08/11/19 (!) 114/58    Wt Readings from Last 3 Encounters:  09/28/19 (!) 206 lb 3.2 oz (93.5 kg)  08/25/19 208 lb 2 oz (94.4 kg)  08/23/19 206 lb (93.4 kg)     Physical Exam Constitutional:      General: She is not in acute distress.    Appearance: She is well-developed.  Cardiovascular:     Rate and Rhythm: Normal rate and regular rhythm.  Pulmonary:     Breath sounds: Normal breath sounds.  Skin:    General: Skin is warm and dry.  Neurological:     Mental Status: She is alert and oriented to person, place, and time.       Assessment & Plan:   Andrew was seen today for follow-up.  Diagnoses and all orders for this visit:  Type 2 diabetes mellitus with vascular disease ([place])  Primary osteoarthritis involving multiple joints -     celecoxib (CELEBREX) 200 MG capsule; Take 1 capsule (200 mg total) by mouth daily. With food  Type 2 diabetes mellitus with hyperglycemia, without long-term current use of insulin ([facility]) -     glipiZIDE (GLUCOTROL) 10 MG tablet; Take 1 tablet (10 mg total) by mouth daily. -     pioglitazone (ACTOS) 30 MG tablet; Take 1 tablet (30 mg total) by mouth daily.  Essential hypertension, benign -     metoprolol tartrate (LOPRESSOR) 25 MG tablet; Take 0.5 tablets (12.5 mg total) by mouth 2 (two) times daily.  Other orders -      amLODipine (NORVASC) 5 MG tablet; Take 1 tablet (5 mg total) by mouth daily. For blood pressure      I am having Michaelyn R. Hotard maintain her meclizine, blood glucose meter kit and supplies, nitroGLYCERIN, gabapentin, omeprazole, PARoxetine, rosuvastatin, docusate sodium, ferrous sulfate, ALPRAZolam, sitaGLIPtin, aspirin EC, amLODipine, celecoxib, glipiZIDE, metoprolol tartrate, and pioglitazone.  Meds ordered this encounter  Medications  . amLODipine (NORVASC) 5 MG tablet    Sig: Take 1 tablet (5 mg total) by mouth daily. For blood pressure    Dispense:  90 tablet  Refill:  1  . celecoxib (CELEBREX) 200 MG capsule    Sig: Take 1 capsule (200 mg total) by mouth daily. With food    Dispense:  90 capsule    Refill:  1  . glipiZIDE (GLUCOTROL) 10 MG tablet    Sig: Take 1 tablet (10 mg total) by mouth daily.    Dispense:  90 tablet    Refill:  3  . metoprolol tartrate (LOPRESSOR) 25 MG tablet    Sig: Take 0.5 tablets (12.5 mg total) by mouth 2 (two) times daily.    Dispense:  90 tablet    Refill:  1  . pioglitazone (ACTOS) 30 MG tablet    Sig: Take 1 tablet (30 mg total) by mouth daily.    Dispense:  90 tablet    Refill:  1     Follow-up: Return in about 1 month (around 10/29/2019).  Claretta Fraise, M.D.

## 2019-10-25 ENCOUNTER — Ambulatory Visit (INDEPENDENT_AMBULATORY_CARE_PROVIDER_SITE_OTHER): Admitting: Family Medicine

## 2019-10-25 ENCOUNTER — Other Ambulatory Visit: Payer: Self-pay

## 2019-10-25 ENCOUNTER — Encounter: Payer: Self-pay | Admitting: Family Medicine

## 2019-10-25 VITALS — BP 130/87 | HR 71 | Temp 97.2°F | Resp 20 | Ht 63.0 in | Wt 207.1 lb

## 2019-10-25 DIAGNOSIS — E1159 Type 2 diabetes mellitus with other circulatory complications: Secondary | ICD-10-CM | POA: Diagnosis not present

## 2019-10-25 DIAGNOSIS — I10 Essential (primary) hypertension: Secondary | ICD-10-CM

## 2019-10-25 DIAGNOSIS — F419 Anxiety disorder, unspecified: Secondary | ICD-10-CM

## 2019-10-25 DIAGNOSIS — E782 Mixed hyperlipidemia: Secondary | ICD-10-CM | POA: Diagnosis not present

## 2019-10-25 DIAGNOSIS — E1121 Type 2 diabetes mellitus with diabetic nephropathy: Secondary | ICD-10-CM

## 2019-10-25 LAB — BAYER DCA HB A1C WAIVED: HB A1C (BAYER DCA - WAIVED): 8.9 % — ABNORMAL HIGH (ref <7.0)

## 2019-10-25 MED ORDER — ALPRAZOLAM 1 MG PO TABS: 1.0000 mg | ORAL_TABLET | Freq: Two times a day (BID) | ORAL | 0 refills | Status: DC | PRN

## 2019-10-25 NOTE — Progress Notes (Signed)
Subjective:  Patient ID: Natalie Tanner,  female    DOB: 1960/08/07  Age: 59 y.o.    CC: Medical Management of Chronic Issues   HPI Teckla Christiansen presents for  follow-up of hypertension. Patient has no history of headache chest pain or shortness of breath or recent cough. Patient also denies symptoms of TIA such as numbness weakness lateralizing. Patient denies side effects from medication. States taking it regularly.  Patient also  in for follow-up of elevated cholesterol. Doing well without complaints on current medication. Denies side effects  including myalgia and arthralgia and nausea. Also in today for liver function testing. Currently no chest pain, shortness of breath or other cardiovascular related symptoms noted.  Follow-up of diabetes. Patient does check blood sugar at home. Readings run around 200. Patient denies symptoms such as excessive hunger or urinary frequency, excessive hunger, nausea No significant hypoglycemic spells noted. Medications reviewed. Pt reports taking them regularly except that she just got the actos last week and just started it today.. Pt. denies complication/adverse reaction today.  Out of xanax. See score below.   GAD 7 : Generalized Anxiety Score 09/28/2019 07/14/2019 03/19/2019  Nervous, Anxious, on Edge 1 1 0  Control/stop worrying 0 0 0  Worry too much - different things 0 0 0  Trouble relaxing 1 1 3   Restless 1 1 0  Easily annoyed or irritable 3 3 1   Afraid - awful might happen 1 1 0  Total GAD 7 Score 7 7 4   Anxiety Difficulty Somewhat difficult Very difficult Somewhat difficult      History Carmel has a past medical history of Acute renal failure (ARF) ([place]), Anxiety, Coronary atherosclerosis of native coronary artery, Depression, Essential hypertension, GERD (gastroesophageal reflux disease), History of kidney stones, Iron deficiency anemia, Mixed hyperlipidemia, Myocardial infarction ([place]), Nephrolithiasis, Stroke ([place])  (2017), and Type 2 diabetes mellitus ([place]).   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   Her family history includes Other in her father; Stroke in her father.She reports that she quit smoking about 18 years ago. Her smoking use included cigarettes. She has a 20.00 pack-year smoking history. She has never used smokeless tobacco. She reports that she does not drink alcohol and does not use drugs.  Current Outpatient Medications on File Prior to Visit  Medication Sig Dispense Refill  . amLODipine (NORVASC) 5 MG tablet Take 1 tablet (5 mg total) by mouth daily. For blood pressure 90 tablet 1  . aspirin EC 81 MG tablet Take 81 mg by mouth daily. Swallow whole.    . blood glucose meter kit and supplies KIT Dispense based on patient and insurance preference. Use up to four times daily as directed. For diabetes E11.9, with hyperglycemia. 90 each 11  . celecoxib (CELEBREX) 200 MG capsule Take 1 capsule (200 mg total) by mouth daily. With food 90 capsule 1  . ferrous sulfate 325 (65 FE) MG tablet Take 1 tablet (325 mg total) by mouth 3 (three) times daily after meals. 90 tablet 1  . gabapentin (NEURONTIN) 300 MG capsule Take 1 capsule (300 mg total) by mouth 3 (three) times daily. (Patient taking differently: Take 300 mg by mouth 3 (three) times daily. Pt states takes bid) 90 capsule 3  . glipiZIDE (GLUCOTROL) 10 MG tablet Take 1 tablet (10 mg total) by mouth daily. 90 tablet 3  . meclizine (ANTIVERT) 25 MG tablet Take 1  tablet (25 mg total) by mouth 3 (three) times daily as needed for dizziness. 90 tablet 11  . metoprolol tartrate (LOPRESSOR) 25 MG tablet Take 0.5 tablets (12.5 mg total) by mouth 2 (two) times daily. 90 tablet 1  . nitroGLYCERIN (NITROSTAT) 0.4 MG SL tablet Place 1 tablet (0.4 mg total) under the tongue every 5 (five) minutes as needed for chest pain. 25 tablet 2  . omeprazole  (PRILOSEC OTC) 20 MG tablet Take 1 tablet (20 mg total) by mouth daily. 90 tablet 3  . PARoxetine (PAXIL) 20 MG tablet Take 1 tablet (20 mg total) by mouth every morning. 90 tablet 3  . pioglitazone (ACTOS) 30 MG tablet Take 1 tablet (30 mg total) by mouth daily. 90 tablet 1  . rosuvastatin (CRESTOR) 10 MG tablet Take 1 tablet (10 mg total) by mouth daily. 90 tablet 3  . sitaGLIPtin (JANUVIA) 25 MG tablet Take 1 tablet (25 mg total) by mouth daily. 30 tablet 0   No current facility-administered medications on file prior to visit.    ROS Review of Systems  Constitutional: Negative.   HENT: Negative for congestion.   Eyes: Negative for visual disturbance.  Respiratory: Negative for shortness of breath.   Cardiovascular: Negative for chest pain.  Gastrointestinal: Negative for abdominal pain, constipation, diarrhea, nausea and vomiting.  Genitourinary: Negative for difficulty urinating.  Musculoskeletal: Negative for arthralgias and myalgias.  Neurological: Negative for headaches.  Psychiatric/Behavioral: Negative for sleep disturbance. The patient is nervous/anxious.     Objective:  BP 130/87   Pulse 71   Temp (!) 97.2 F (36.2 C) (Temporal)   Resp 20   Ht 5' 3"  (1.6 m)   Wt 207 lb 2 oz (94 kg)   LMP 07/05/2010   SpO2 97%   BMI 36.69 kg/m   BP Readings from Last 3 Encounters:  10/25/19 130/87  09/28/19 128/79  08/25/19 (!) 156/85    Wt Readings from Last 3 Encounters:  10/25/19 207 lb 2 oz (94 kg)  09/28/19 (!) 206 lb 3.2 oz (93.5 kg)  08/25/19 208 lb 2 oz (94.4 kg)     Physical Exam Constitutional:      General: She is not in acute distress.    Appearance: She is well-developed.  Cardiovascular:     Rate and Rhythm: Normal rate and regular rhythm.  Pulmonary:     Breath sounds: Normal breath sounds.  Skin:    General: Skin is warm and dry.  Neurological:     Mental Status: She is alert and oriented to person, place, and time.     Diabetic Foot Exam -  Simple   No data filed        Assessment & Plan:   Kamrin was seen today for medical management of chronic issues.  Diagnoses and all orders for this visit:  Type 2 diabetes mellitus with vascular disease ([place]) -     Bayer DCA Hb A1c Waived -     CBC with Differential/Platelet -     CMP14+EGFR -     Lipid panel  Essential hypertension, benign -     CBC with Differential/Platelet -     CMP14+EGFR -     Lipid panel  Mixed hyperlipidemia -     CBC with Differential/Platelet -     CMP14+EGFR -     Lipid panel  Anxiety -     ALPRAZolam (XANAX) 1 MG tablet; Take 1 tablet (1 mg total) by mouth 2 (two) times daily as needed.  Type 2 diabetes mellitus with diabetic nephropathy, without long-term current use of insulin ([place])   I have discontinued Harshika R. Glab's docusate sodium. I am also having her maintain her meclizine, blood glucose meter kit and supplies, nitroGLYCERIN, gabapentin, omeprazole, PARoxetine, rosuvastatin, ferrous sulfate, sitaGLIPtin, aspirin EC, amLODipine, celecoxib, glipiZIDE, metoprolol tartrate, pioglitazone, and ALPRAZolam.  Meds ordered this encounter  Medications  . ALPRAZolam (XANAX) 1 MG tablet    Sig: Take 1 tablet (1 mg total) by mouth 2 (two) times daily as needed.    Dispense:  180 tablet    Refill:  0    This prescription was filled on 12/24/2017. Any refills authorized will be placed on file.     Follow-up: Return in about 3 months (around 01/25/2020).  Claretta Fraise, M.D.

## 2019-10-26 LAB — CMP14+EGFR
ALT: 18 IU/L (ref 0–32)
AST: 21 IU/L (ref 0–40)
Albumin/Globulin Ratio: 2.1 (ref 1.2–2.2)
Albumin: 4.4 g/dL (ref 3.8–4.9)
Alkaline Phosphatase: 77 IU/L (ref 48–121)
BUN/Creatinine Ratio: 11 (ref 9–23)
BUN: 10 mg/dL (ref 6–24)
Bilirubin Total: 0.9 mg/dL (ref 0.0–1.2)
CO2: 28 mmol/L (ref 20–29)
Calcium: 10.1 mg/dL (ref 8.7–10.2)
Chloride: 99 mmol/L (ref 96–106)
Creatinine, Ser: 0.91 mg/dL (ref 0.57–1.00)
GFR calc Af Amer: 80 mL/min/{1.73_m2} (ref >59)
GFR calc non Af Amer: 70 mL/min/{1.73_m2} (ref >59)
Globulin, Total: 2.1 g/dL (ref 1.5–4.5)
Glucose: 287 mg/dL — ABNORMAL HIGH (ref 65–99)
Potassium: 4 mmol/L (ref 3.5–5.2)
Sodium: 140 mmol/L (ref 134–144)
Total Protein: 6.5 g/dL (ref 6.0–8.5)

## 2019-10-26 LAB — CBC WITH DIFFERENTIAL/PLATELET
Basophils Absolute: 0 10*3/uL (ref 0.0–0.2)
Basos: 1 %
EOS (ABSOLUTE): 0.2 10*3/uL (ref 0.0–0.4)
Eos: 3 %
Hematocrit: 43.6 % (ref 34.0–46.6)
Hemoglobin: 14.7 g/dL (ref 11.1–15.9)
Immature Grans (Abs): 0 10*3/uL (ref 0.0–0.1)
Immature Granulocytes: 0 %
Lymphocytes Absolute: 1.9 10*3/uL (ref 0.7–3.1)
Lymphs: 36 %
MCH: 31.9 pg (ref 26.6–33.0)
MCHC: 33.7 g/dL (ref 31.5–35.7)
MCV: 95 fL (ref 79–97)
Monocytes Absolute: 0.4 10*3/uL (ref 0.1–0.9)
Monocytes: 7 %
Neutrophils Absolute: 2.9 10*3/uL (ref 1.4–7.0)
Neutrophils: 53 %
Platelets: 205 10*3/uL (ref 150–450)
RBC: 4.61 x10E6/uL (ref 3.77–5.28)
RDW: 12.7 % (ref 11.7–15.4)
WBC: 5.4 10*3/uL (ref 3.4–10.8)

## 2019-10-26 LAB — LIPID PANEL
Chol/HDL Ratio: 3.6 ratio (ref 0.0–4.4)
Cholesterol, Total: 126 mg/dL (ref 100–199)
HDL: 35 mg/dL — ABNORMAL LOW (ref >39)
LDL Chol Calc (NIH): 47 mg/dL (ref 0–99)
Triglycerides: 282 mg/dL — ABNORMAL HIGH (ref 0–149)
VLDL Cholesterol Cal: 44 mg/dL — ABNORMAL HIGH (ref 5–40)

## 2020-01-25 ENCOUNTER — Other Ambulatory Visit: Payer: Self-pay

## 2020-01-25 ENCOUNTER — Ambulatory Visit (INDEPENDENT_AMBULATORY_CARE_PROVIDER_SITE_OTHER): Admitting: Family Medicine

## 2020-01-25 ENCOUNTER — Encounter: Payer: Self-pay | Admitting: Family Medicine

## 2020-01-25 VITALS — BP 133/88 | HR 84 | Temp 97.8°F | Resp 20 | Ht 63.0 in | Wt 203.0 lb

## 2020-01-25 DIAGNOSIS — I10 Essential (primary) hypertension: Secondary | ICD-10-CM | POA: Diagnosis not present

## 2020-01-25 DIAGNOSIS — F419 Anxiety disorder, unspecified: Secondary | ICD-10-CM | POA: Diagnosis not present

## 2020-01-25 DIAGNOSIS — E1159 Type 2 diabetes mellitus with other circulatory complications: Secondary | ICD-10-CM

## 2020-01-25 DIAGNOSIS — E782 Mixed hyperlipidemia: Secondary | ICD-10-CM | POA: Diagnosis not present

## 2020-01-25 LAB — BAYER DCA HB A1C WAIVED: HB A1C (BAYER DCA - WAIVED): 7.1 % — ABNORMAL HIGH (ref <7.0)

## 2020-01-25 MED ORDER — EMPAGLIFLOZIN 25 MG PO TABS: 25.0000 mg | ORAL_TABLET | Freq: Every day | ORAL | 1 refills | Status: DC

## 2020-01-25 MED ORDER — ALPRAZOLAM 1 MG PO TABS: 1.0000 mg | ORAL_TABLET | Freq: Two times a day (BID) | ORAL | 1 refills | Status: DC | PRN

## 2020-01-25 MED ORDER — AMLODIPINE BESYLATE 5 MG PO TABS: 5.0000 mg | ORAL_TABLET | Freq: Every day | ORAL | 1 refills | Status: DC

## 2020-01-25 NOTE — Addendum Note (Signed)
Addended by: Claretta Fraise on: 01/25/2020 04:23 PM   Modules accepted: Orders

## 2020-01-25 NOTE — Progress Notes (Signed)
Subjective:  Patient ID: Natalie Tanner, female    DOB: [birth year]  Age: 59 y.o. MRN: [id number]  CC: Medical Management of Chronic Issues   HPI Natalie Tanner presents for Follow-up of diabetes. Patient checks blood sugar at home.   120 fasting and 169 postprandial Patient denies symptoms such as polyuria, polydipsia, excessive hunger, nausea No significant hypoglycemic spells noted. Medications reviewed. Pt reports taking them regularly without complication/adverse reaction being reported today.  Xanax helping with GAD sx including annoyance, irritability and worry  History Natalie Tanner has a past medical history of Acute renal failure (ARF) ([place]), Anxiety, Coronary atherosclerosis of native coronary artery, Depression, Essential hypertension, GERD (gastroesophageal reflux disease), History of kidney stones, Iron deficiency anemia, Mixed hyperlipidemia, Myocardial infarction [facility]), Nephrolithiasis, Stroke ([place]) (2017), and Type 2 diabetes mellitus ([place]).   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   Her family history includes Other in her father; Stroke in her father.She reports that she quit smoking about 18 years ago. Her smoking use included cigarettes. She has a 20.00 pack-year smoking history. She has never used smokeless tobacco. She reports that she does not drink alcohol and does not use drugs.  Current Outpatient Medications on File Prior to Visit  Medication Sig Dispense Refill  . aspirin EC 81 MG tablet Take 81 mg by mouth daily. Swallow whole.    . blood glucose meter kit and supplies KIT Dispense based on patient and insurance preference. Use up to four times daily as directed. For diabetes E11.9, with hyperglycemia. 90 each 11  . celecoxib (CELEBREX) 200 MG capsule Take 1 capsule (200 mg total) by mouth daily. With food 90 capsule 1  . ferrous sulfate 325 (65  FE) MG tablet Take 1 tablet (325 mg total) by mouth 3 (three) times daily after meals. 90 tablet 1  . gabapentin (NEURONTIN) 300 MG capsule Take 1 capsule (300 mg total) by mouth 3 (three) times daily. (Patient taking differently: Take 300 mg by mouth 3 (three) times daily. Pt states takes bid) 90 capsule 3  . meclizine (ANTIVERT) 25 MG tablet Take 1 tablet (25 mg total) by mouth 3 (three) times daily as needed for dizziness. 90 tablet 11  . metoprolol tartrate (LOPRESSOR) 25 MG tablet Take 0.5 tablets (12.5 mg total) by mouth 2 (two) times daily. 90 tablet 1  . nitroGLYCERIN (NITROSTAT) 0.4 MG SL tablet Place 1 tablet (0.4 mg total) under the tongue every 5 (five) minutes as needed for chest pain. 25 tablet 2  . omeprazole (PRILOSEC OTC) 20 MG tablet Take 1 tablet (20 mg total) by mouth daily. 90 tablet 3  . PARoxetine (PAXIL) 20 MG tablet Take 1 tablet (20 mg total) by mouth every morning. 90 tablet 3  . pioglitazone (ACTOS) 30 MG tablet Take 1 tablet (30 mg total) by mouth daily. 90 tablet 1  . rosuvastatin (CRESTOR) 10 MG tablet Take 1 tablet (10 mg total) by mouth daily. 90 tablet 3   No current facility-administered medications on file prior to visit.    ROS Review of Systems  Constitutional: Negative.   HENT: Negative.   Eyes: Negative for visual disturbance.  Respiratory: Negative for shortness of breath.   Cardiovascular: Negative for chest pain.  Gastrointestinal: Negative for abdominal pain.  Musculoskeletal: Negative for arthralgias.    Objective:  BP 133/88   Pulse 84   Temp 97.8 F (36.6 C) (Temporal)  Resp 20   Ht _0  (1.6 m)   Wt 203 lb (92.1 kg)   LMP 07/05/2010   SpO2 98%   BMI 35.96 kg/m   BP Readings from Last 3 Encounters:  01/25/20 133/88  10/25/19 130/87  09/28/19 128/79    Wt Readings from Last 3 Encounters:  01/25/20 203 lb (92.1 kg)  10/25/19 207 lb 2 oz (94 kg)  09/28/19 (!) 206 lb 3.2 oz (93.5 kg)     Physical Exam Constitutional:       General: She is not in acute distress.    Appearance: She is well-developed.  HENT:     Head: Normocephalic and atraumatic.  Eyes:     Conjunctiva/sclera: Conjunctivae normal.     Pupils: Pupils are equal, round, and reactive to light.  Neck:     Thyroid: No thyromegaly.  Cardiovascular:     Rate and Rhythm: Normal rate and regular rhythm.     Heart sounds: Normal heart sounds. No murmur heard.   Pulmonary:     Effort: Pulmonary effort is normal. No respiratory distress.     Breath sounds: Normal breath sounds. No wheezing or rales.  Abdominal:     General: Bowel sounds are normal. There is no distension.     Palpations: Abdomen is soft.     Tenderness: There is no abdominal tenderness.  Musculoskeletal:        General: Normal range of motion.     Cervical back: Normal range of motion and neck supple.  Lymphadenopathy:     Cervical: No cervical adenopathy.  Skin:    General: Skin is warm and dry.  Neurological:     Mental Status: She is alert and oriented to person, place, and time.  Psychiatric:        Behavior: Behavior normal.        Thought Content: Thought content normal.        Judgment: Judgment normal.       Assessment & Plan:   Natalie Tanner was seen today for medical management of chronic issues.  Diagnoses and all orders for this visit:  Type 2 diabetes mellitus with vascular disease ([place]) -     Bayer DCA Hb A1c Waived -     CBC with Differential/Platelet -     CMP14+EGFR -     Lipid panel  Essential hypertension, benign -     CBC with Differential/Platelet -     CMP14+EGFR -     Lipid panel  Mixed hyperlipidemia -     CBC with Differential/Platelet -     CMP14+EGFR -     Lipid panel  Anxiety -     ALPRAZolam (XANAX) 1 MG tablet; Take 1 tablet (1 mg total) by mouth 2 (two) times daily as needed.  Other orders -     amLODipine (NORVASC) 5 MG tablet; Take 1 tablet (5 mg total) by mouth daily. For blood pressure -     empagliflozin (JARDIANCE) 25 MG  TABS tablet; Take 1 tablet (25 mg total) by mouth daily.      I have discontinued Natalie Tanner's sitaGLIPtin and glipiZIDE. I am also having her start on empagliflozin. Additionally, I am having her maintain her meclizine, blood glucose meter kit and supplies, nitroGLYCERIN, gabapentin, omeprazole, PARoxetine, rosuvastatin, ferrous sulfate, aspirin EC, celecoxib, metoprolol tartrate, pioglitazone, ALPRAZolam, and amLODipine.  Meds ordered this encounter  Medications  . ALPRAZolam (XANAX) 1 MG tablet    Sig: Take 1 tablet (1 mg total) by  mouth 2 (two) times daily as needed.    Dispense:  180 tablet    Refill:  1  . amLODipine (NORVASC) 5 MG tablet    Sig: Take 1 tablet (5 mg total) by mouth daily. For blood pressure    Dispense:  90 tablet    Refill:  1  . empagliflozin (JARDIANCE) 25 MG TABS tablet    Sig: Take 1 tablet (25 mg total) by mouth daily.    Dispense:  90 tablet    Refill:  1   We discussed eating 3 meals a day, weight loss, proper diet foods Follow-up: Return in about 3 months (around 04/26/2020).  Claretta Fraise, M.D.

## 2020-01-26 LAB — CBC WITH DIFFERENTIAL/PLATELET
Basophils Absolute: 0 10*3/uL (ref 0.0–0.2)
Basos: 1 %
EOS (ABSOLUTE): 0.3 10*3/uL (ref 0.0–0.4)
Eos: 6 %
Hematocrit: 43.4 % (ref 34.0–46.6)
Hemoglobin: 14.6 g/dL (ref 11.1–15.9)
Immature Grans (Abs): 0 10*3/uL (ref 0.0–0.1)
Immature Granulocytes: 0 %
Lymphocytes Absolute: 1.5 10*3/uL (ref 0.7–3.1)
Lymphs: 35 %
MCH: 31.3 pg (ref 26.6–33.0)
MCHC: 33.6 g/dL (ref 31.5–35.7)
MCV: 93 fL (ref 79–97)
Monocytes Absolute: 0.3 10*3/uL (ref 0.1–0.9)
Monocytes: 6 %
Neutrophils Absolute: 2.3 10*3/uL (ref 1.4–7.0)
Neutrophils: 52 %
Platelets: 212 10*3/uL (ref 150–450)
RBC: 4.66 x10E6/uL (ref 3.77–5.28)
RDW: 12.9 % (ref 11.7–15.4)
WBC: 4.3 10*3/uL (ref 3.4–10.8)

## 2020-01-26 LAB — CMP14+EGFR
ALT: 17 IU/L (ref 0–32)
AST: 19 IU/L (ref 0–40)
Albumin/Globulin Ratio: 1.7 (ref 1.2–2.2)
Albumin: 4 g/dL (ref 3.8–4.9)
Alkaline Phosphatase: 77 IU/L (ref 44–121)
BUN/Creatinine Ratio: 13 (ref 9–23)
BUN: 10 mg/dL (ref 6–24)
Bilirubin Total: 0.8 mg/dL (ref 0.0–1.2)
CO2: 25 mmol/L (ref 20–29)
Calcium: 8.8 mg/dL (ref 8.7–10.2)
Chloride: 107 mmol/L — ABNORMAL HIGH (ref 96–106)
Creatinine, Ser: 0.76 mg/dL (ref 0.57–1.00)
GFR calc Af Amer: 100 mL/min/{1.73_m2} (ref >59)
GFR calc non Af Amer: 87 mL/min/{1.73_m2} (ref >59)
Globulin, Total: 2.3 g/dL (ref 1.5–4.5)
Glucose: 167 mg/dL — ABNORMAL HIGH (ref 65–99)
Potassium: 3.6 mmol/L (ref 3.5–5.2)
Sodium: 144 mmol/L (ref 134–144)
Total Protein: 6.3 g/dL (ref 6.0–8.5)

## 2020-01-26 LAB — LIPID PANEL
Chol/HDL Ratio: 2.9 ratio (ref 0.0–4.4)
Cholesterol, Total: 132 mg/dL (ref 100–199)
HDL: 45 mg/dL (ref >39)
LDL Chol Calc (NIH): 60 mg/dL (ref 0–99)
Triglycerides: 160 mg/dL — ABNORMAL HIGH (ref 0–149)
VLDL Cholesterol Cal: 27 mg/dL (ref 5–40)

## 2020-01-26 NOTE — Progress Notes (Signed)
Hello Nathalia,  Your lab result is normal and/or stable.Some minor variations that are not significant are commonly marked abnormal, but do not represent any medical problem for you.  Best regards, Svetlana Bagby, M.D.

## 2020-03-22 IMAGING — CR DG CHEST 2V
2 series · 2 of 2 positions shown · non-contrast
Comparison: None.

CLINICAL DATA: Preoperative study for hip replacement.

EXAM:
CHEST - 2 VIEW

[w chest pa]
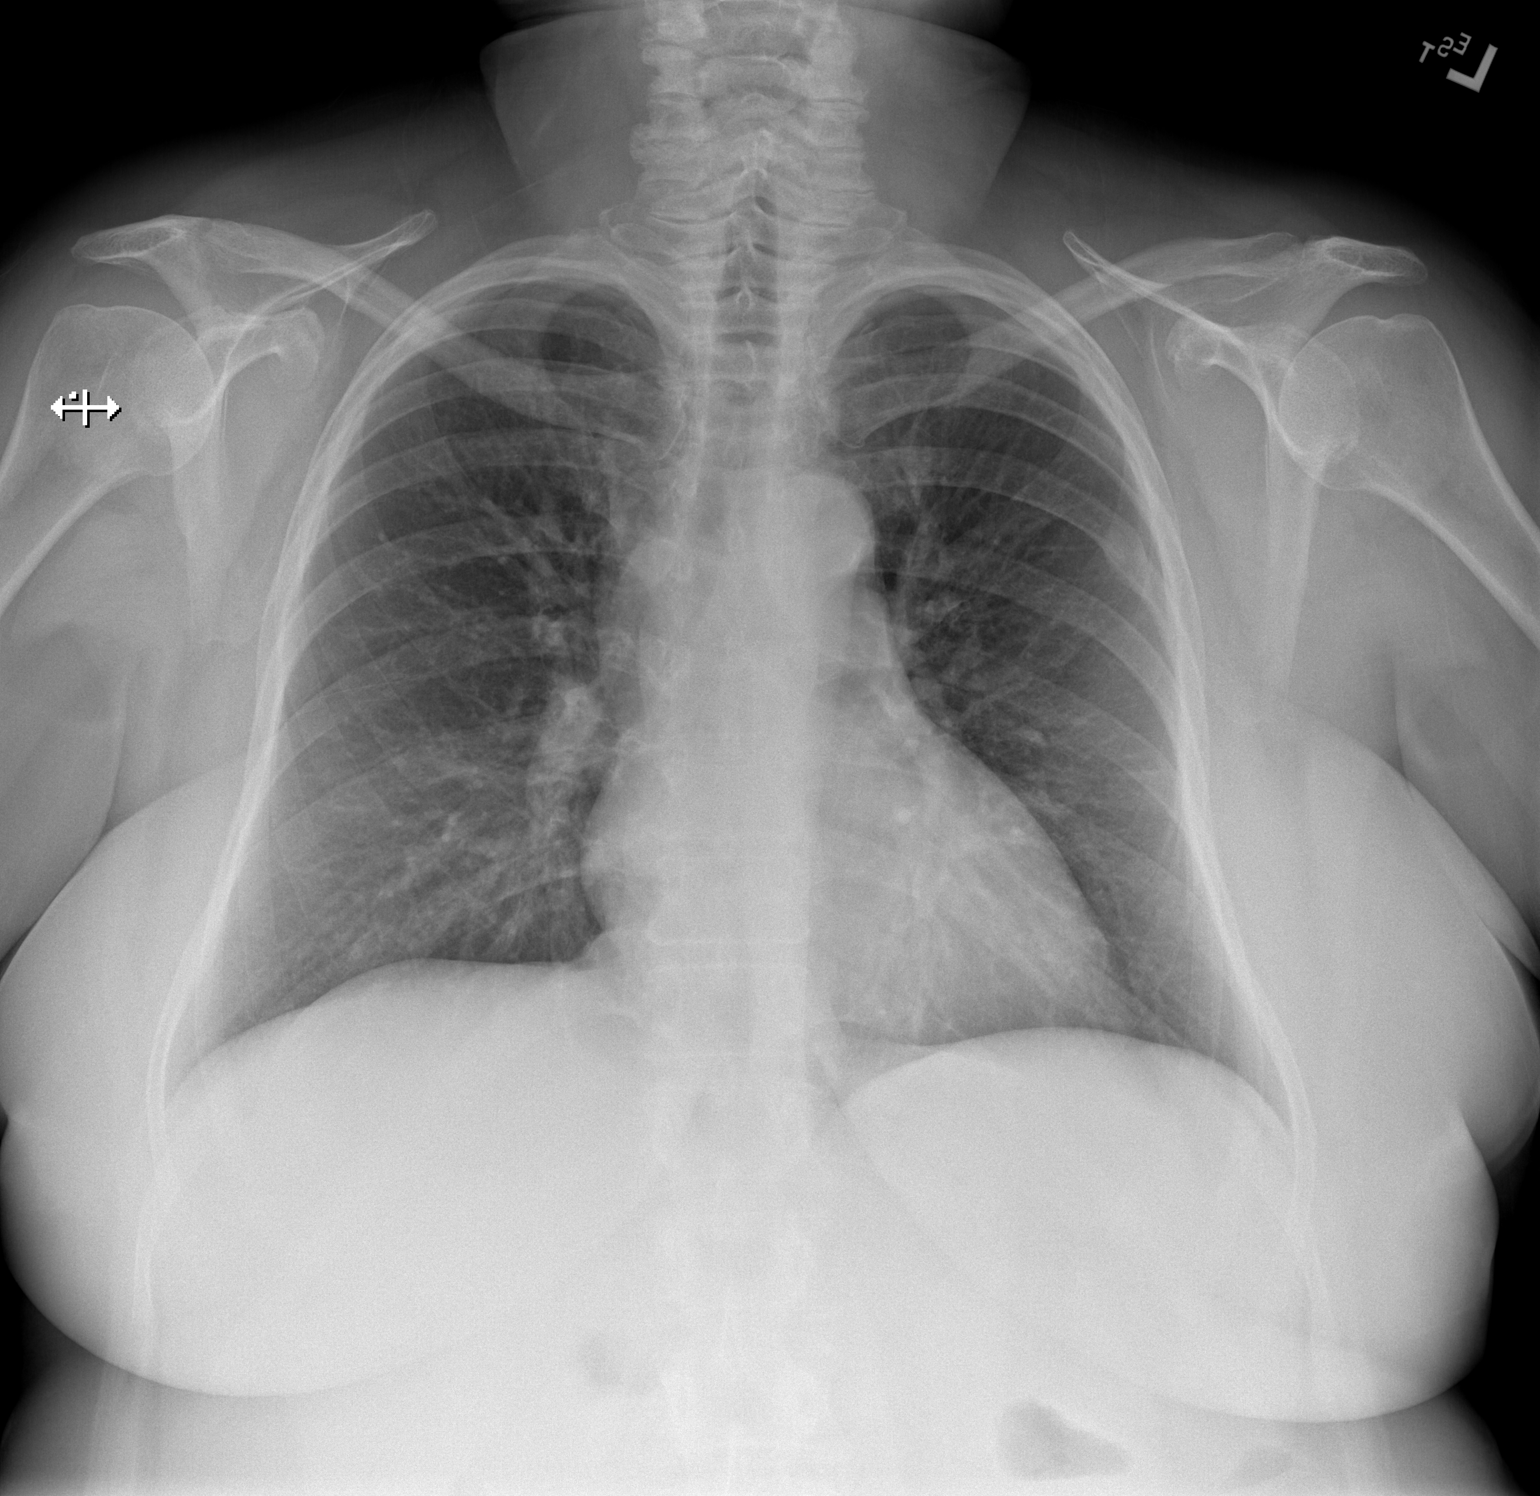

[w chest lat]
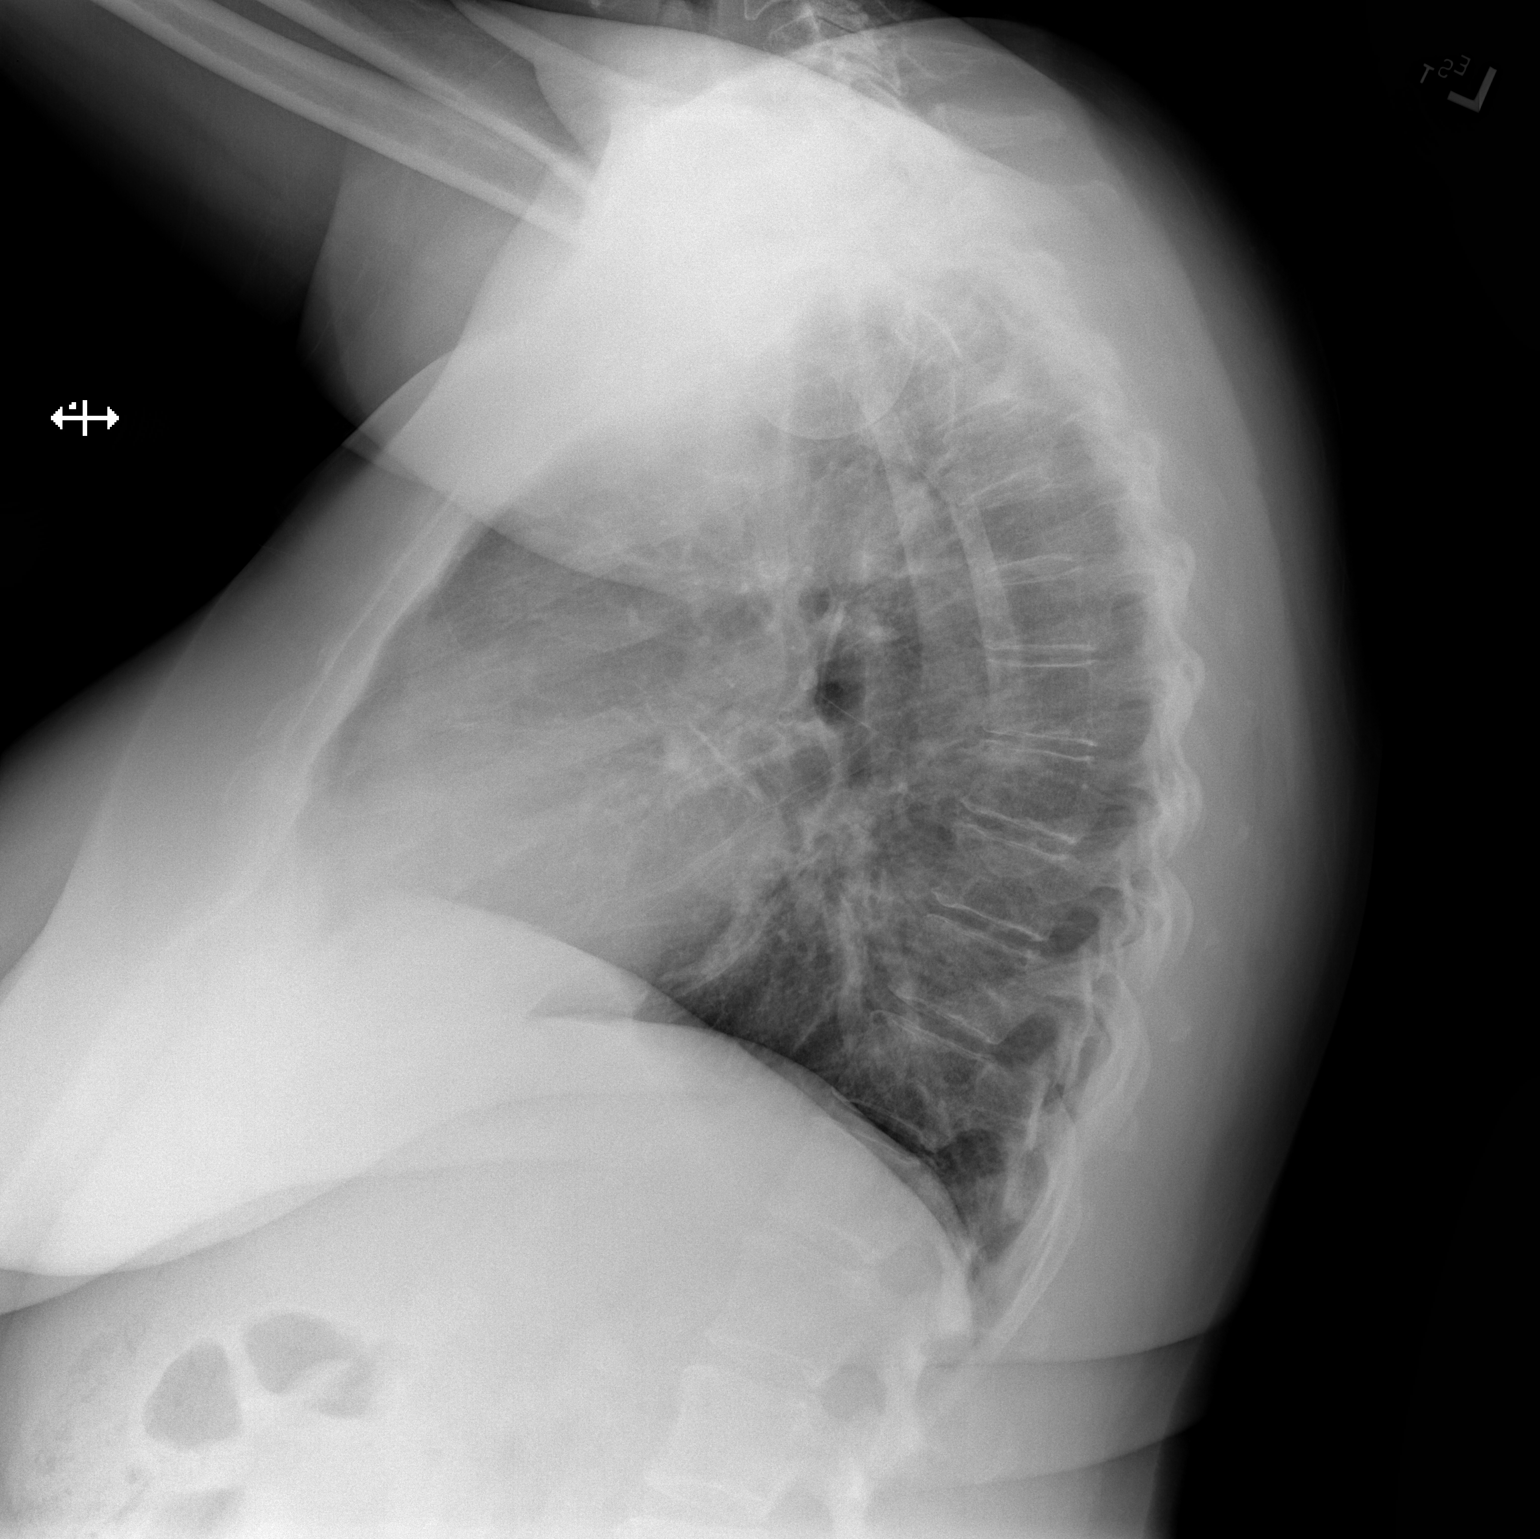

[2 of 2 positions shown; findings below may reference images not displayed]

FINDINGS: The heart size and mediastinal contours are within normal limits.
Atherosclerotic calcification of the aortic arch. Normal pulmonary
vascularity. No focal consolidation, pleural effusion, or
pneumothorax. No acute osseous abnormality.
IMPRESSION: No active cardiopulmonary disease.

## 2020-04-25 ENCOUNTER — Other Ambulatory Visit: Payer: Self-pay | Admitting: Family Medicine

## 2020-04-25 DIAGNOSIS — I10 Essential (primary) hypertension: Secondary | ICD-10-CM

## 2020-04-25 DIAGNOSIS — M159 Polyosteoarthritis, unspecified: Secondary | ICD-10-CM

## 2020-04-25 DIAGNOSIS — E1165 Type 2 diabetes mellitus with hyperglycemia: Secondary | ICD-10-CM

## 2020-04-25 DIAGNOSIS — M8949 Other hypertrophic osteoarthropathy, multiple sites: Secondary | ICD-10-CM

## 2020-04-26 ENCOUNTER — Other Ambulatory Visit: Payer: Self-pay | Admitting: *Deleted

## 2020-04-26 MED ORDER — PAROXETINE HCL 20 MG PO TABS: 20.0000 mg | ORAL_TABLET | ORAL | 0 refills | Status: DC

## 2020-04-26 MED ORDER — OMEPRAZOLE MAGNESIUM 20 MG PO TBEC: 20.0000 mg | DELAYED_RELEASE_TABLET | Freq: Every day | ORAL | 0 refills | Status: DC

## 2020-05-30 ENCOUNTER — Ambulatory Visit: Admitting: Family Medicine

## 2020-07-03 IMAGING — US US RENAL
1 series · 14 of 25 positions shown · non-contrast
Comparison: CT of same date

CLINICAL DATA: Acute renal insufficiency.

EXAM:
RENAL / URINARY TRACT ULTRASOUND COMPLETE

[Series 1: us renal · 14 of 54 slices shown]
[im 1/54]
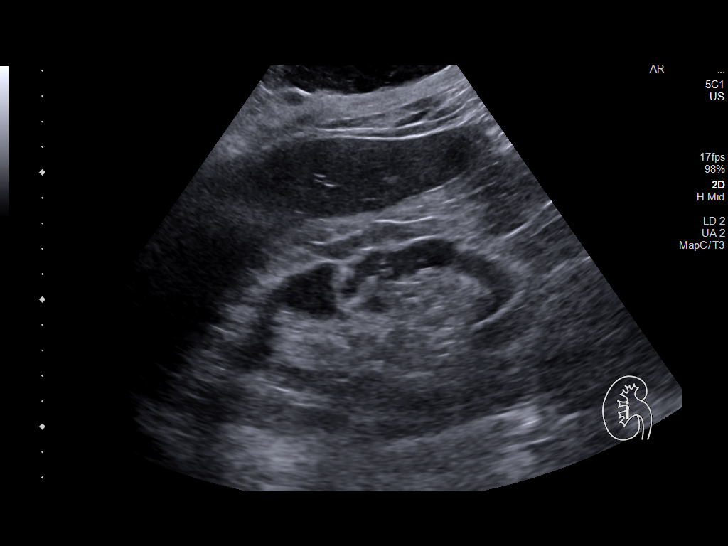
[im 5/54]
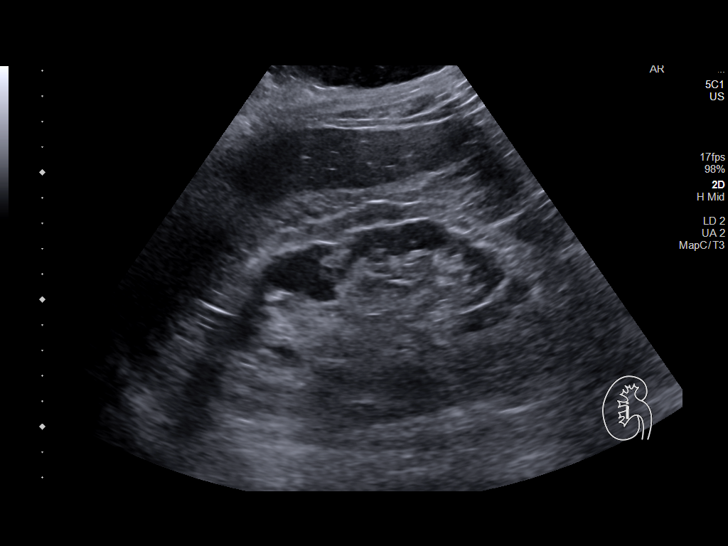
[im 9/54]
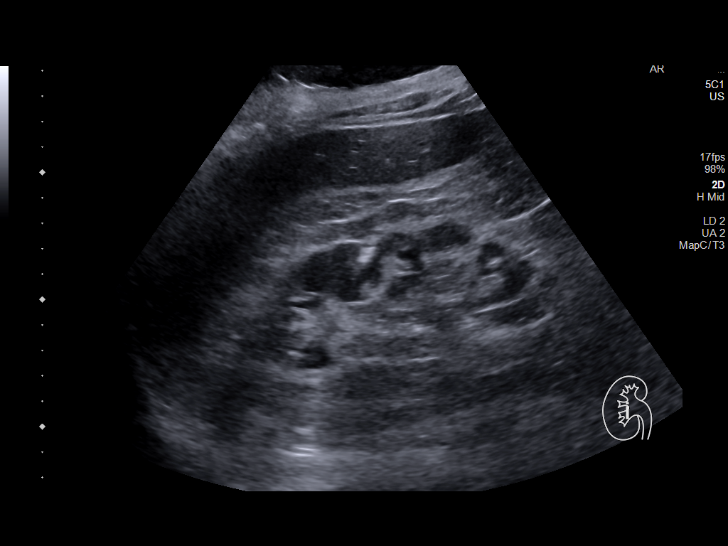
[im 14/54]
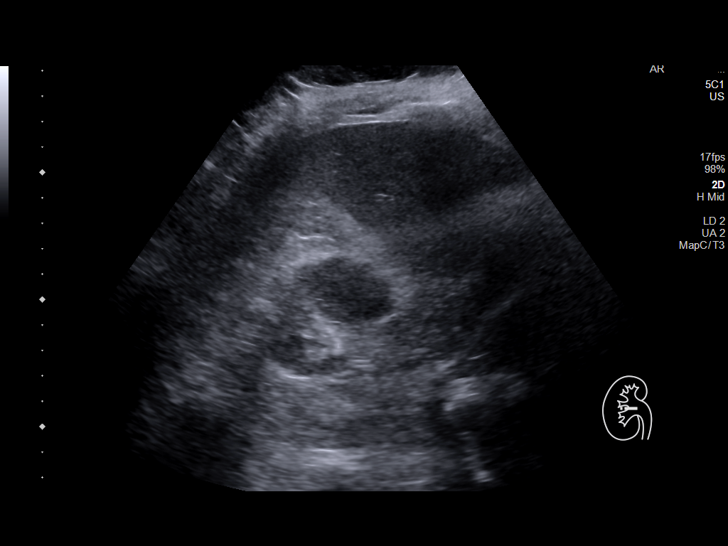
[im 18/54]
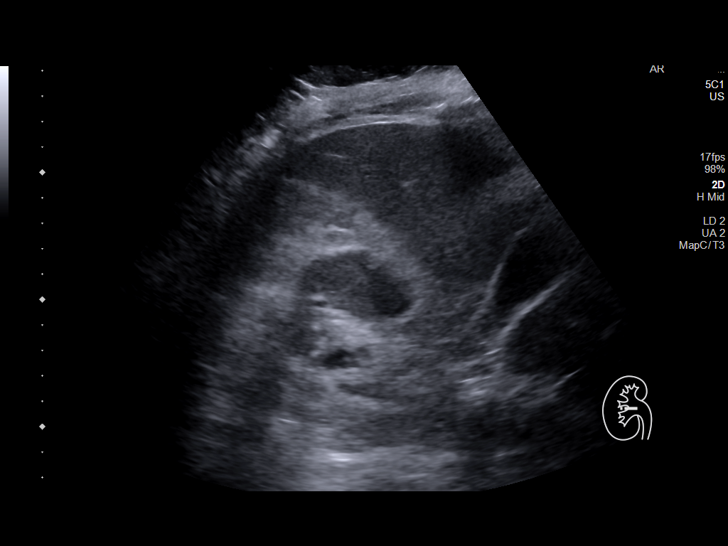
[im 20/54]
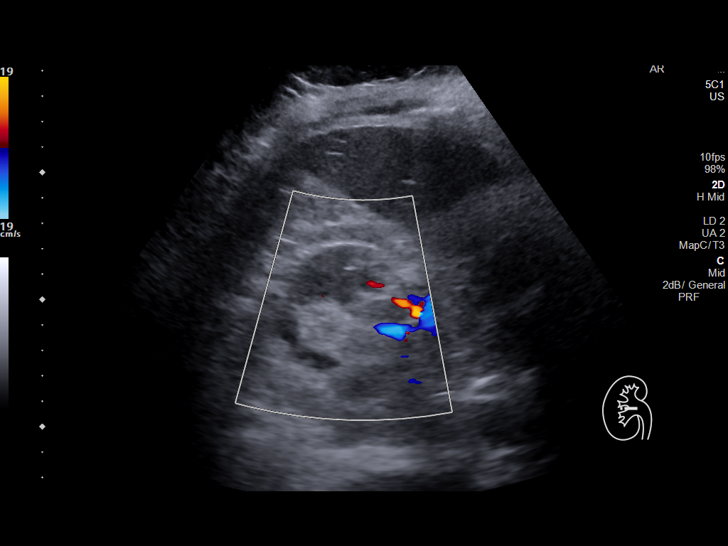
[im 25/54]
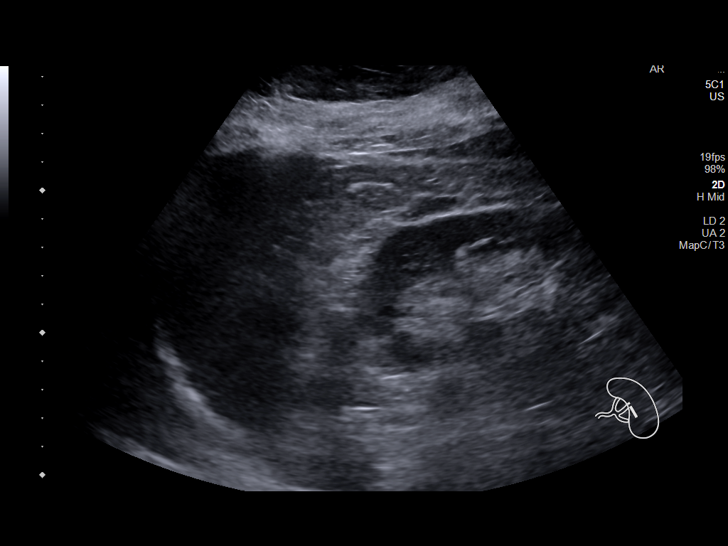
[im 29/54]
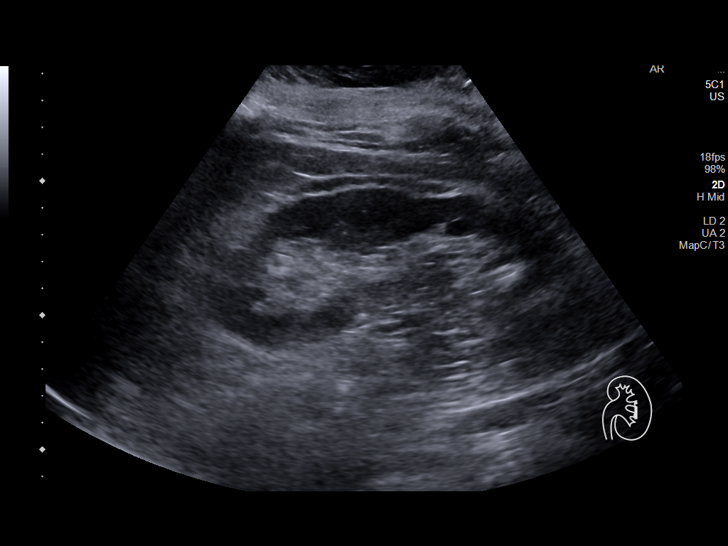
[im 34/54]
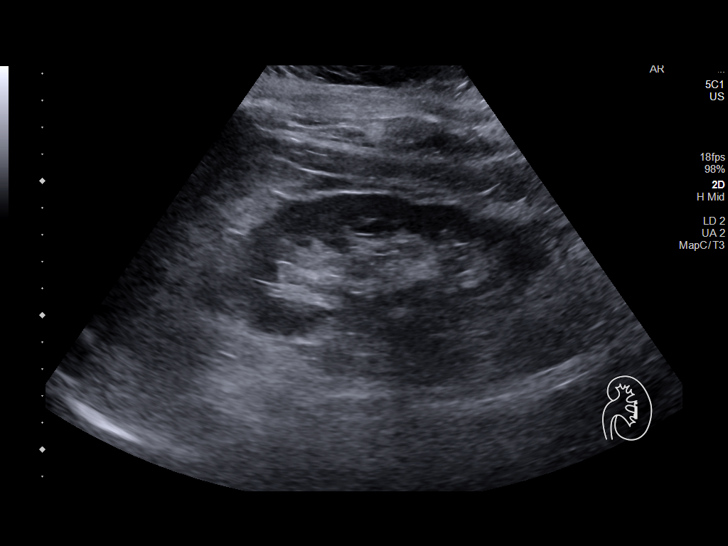
[im 36/54]
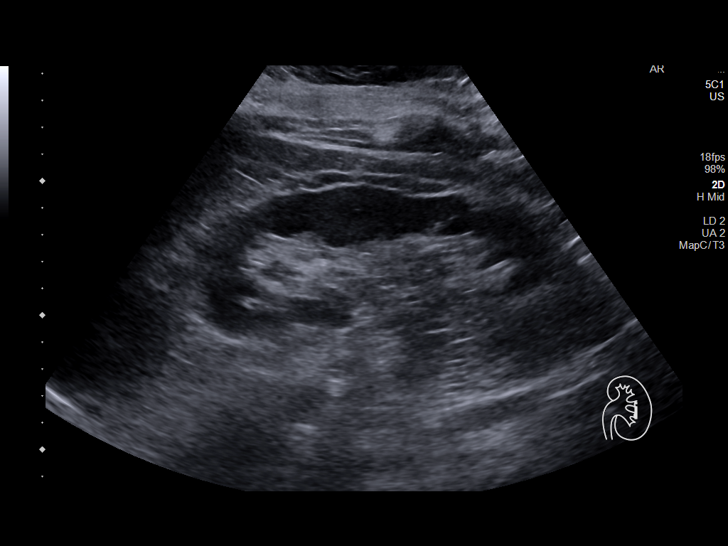
[im 40/54]
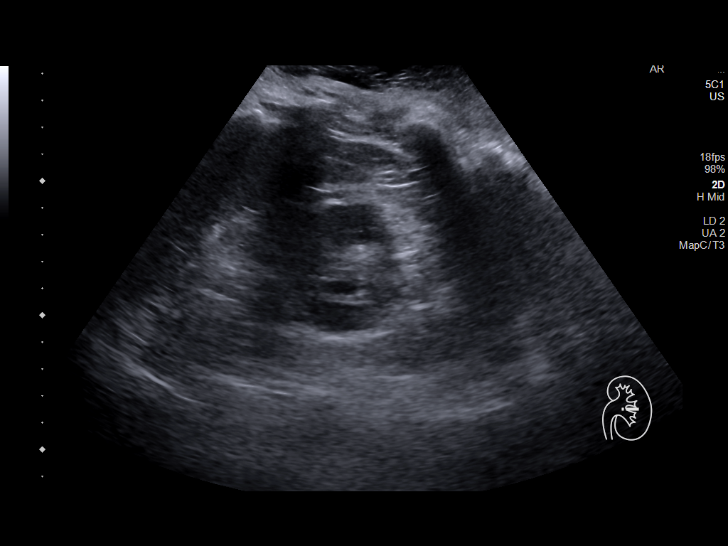
[im 45/54]
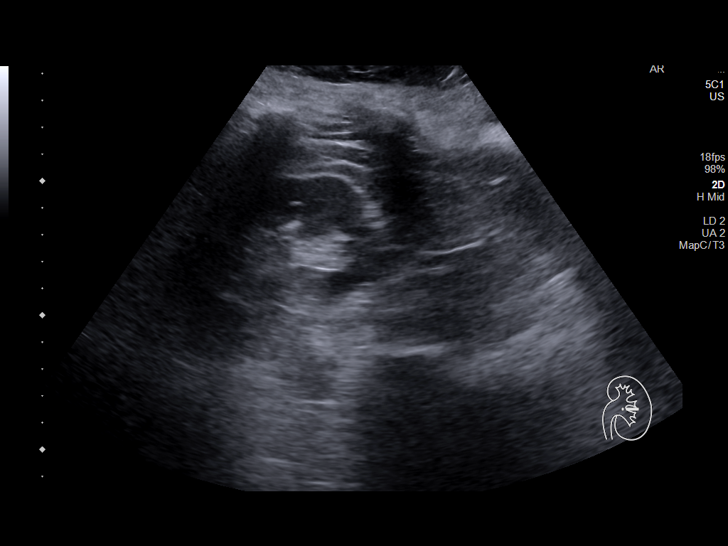
[im 49/54]
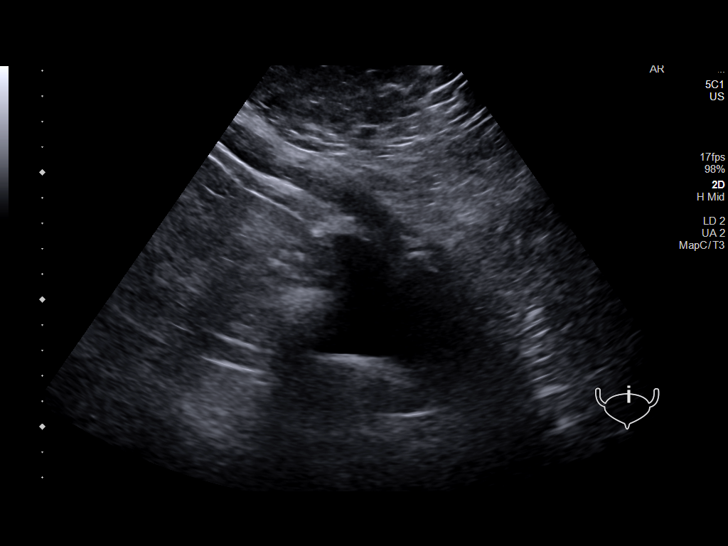
[im 54/54]
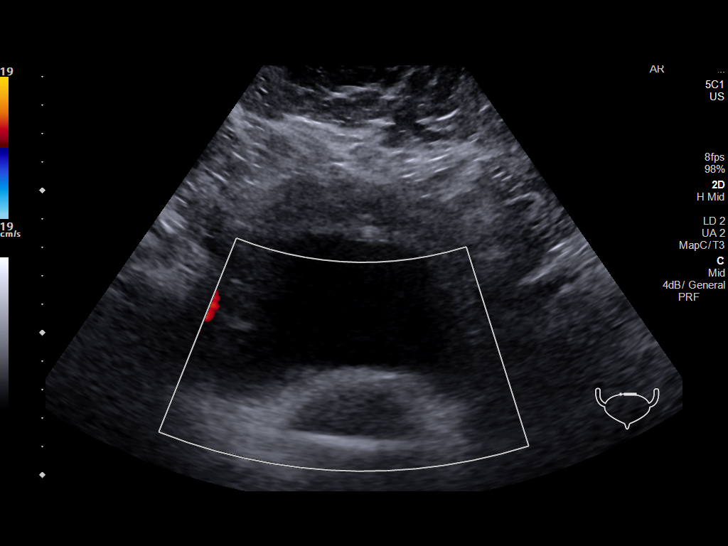

[14 of 25 positions shown; findings below may reference images not displayed]

FINDINGS: Right Kidney:

Renal measurements: 12.4 x 6.1 x 6.1 cm = volume: 240 mL. Normal
echogenicity. Renal cortical thinning. No hydronephrosis.

Left Kidney:

Renal measurements: 13.8 x 6.3 x 6.4 cm = volume: 290 mL. Normal
echogenicity. No hydronephrosis.

Bladder:

Appears normal for degree of bladder distention.

Other:

None.
IMPRESSION: No hydronephrosis.  Right renal cortical thinning.

## 2020-07-03 IMAGING — CT CT ABD-PELV W/O CM
2 of 4 series · 15 of 46 positions shown, 17 images · non-contrast
Comparison: Hip radiograph 05/07/2019, 05/20/2019, CT chest,
abdomen and pelvis 09/23/2017 (report only)

CLINICAL DATA: Mid back pain, onset today

EXAM:
CT ABDOMEN AND PELVIS WITHOUT CONTRAST
TECHNIQUE: Multidetector CT imaging of the abdomen and pelvis was performed
following the standard protocol without IV contrast.

[Series 2: axial st · axial · 0.91mm/px · z∈[-633,-238]mm · 12 of 93 slices shown, 14 images]
[im 7/93  soft-tissue]
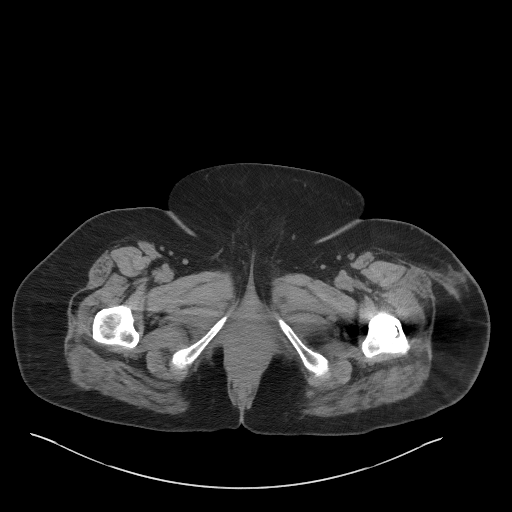
[im 7/93  bone]
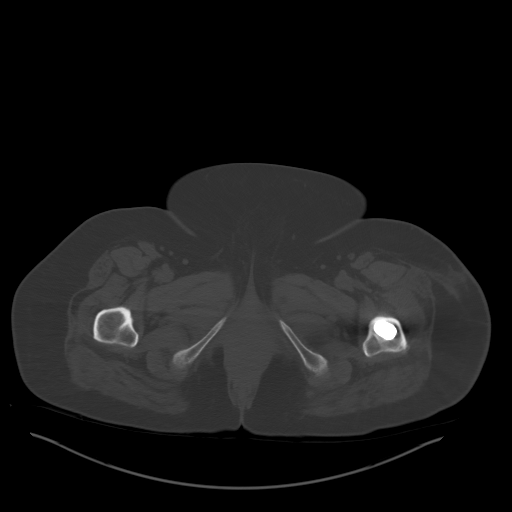
[im 13/93  soft-tissue]
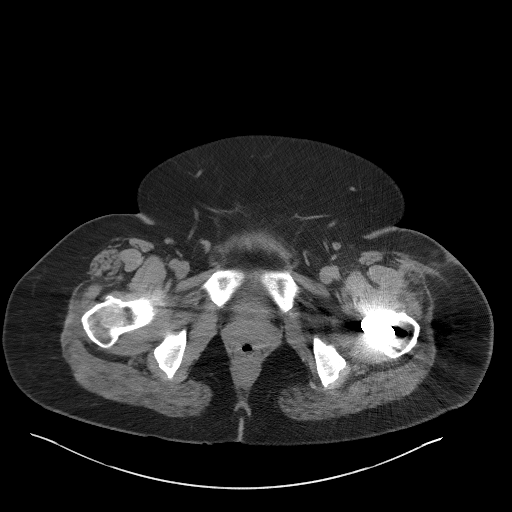
[im 19/93  soft-tissue]
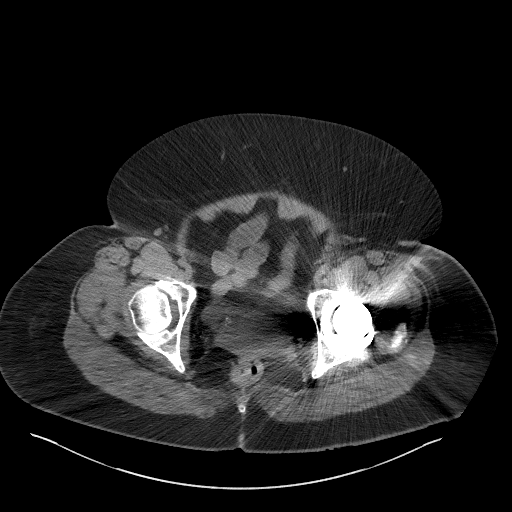
[im 31/93  soft-tissue]
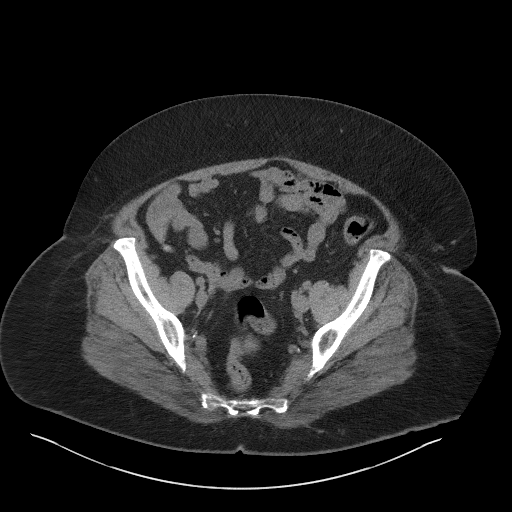
[im 37/93  soft-tissue]
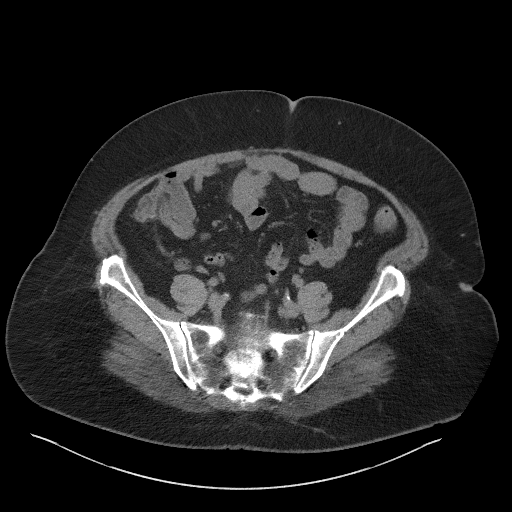
[im 43/93  soft-tissue]
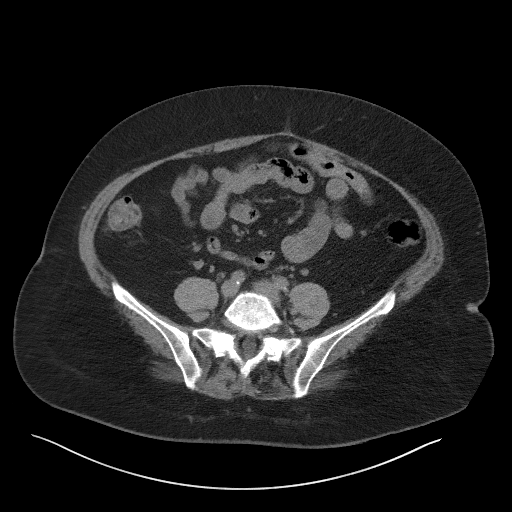
[im 50/93  soft-tissue]
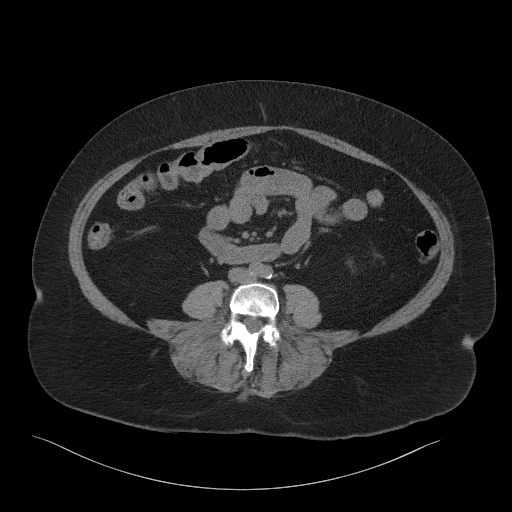
[im 56/93  soft-tissue]
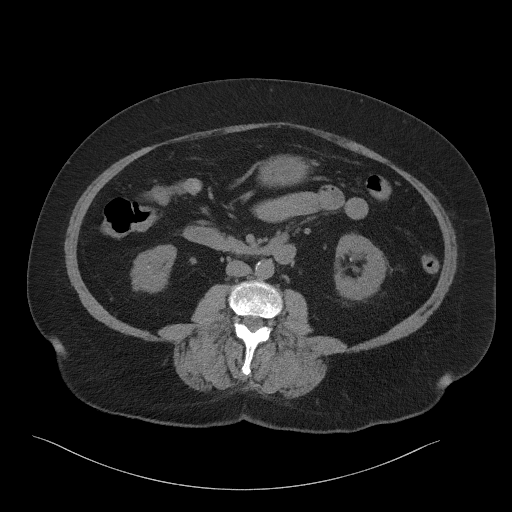
[im 62/93  soft-tissue]
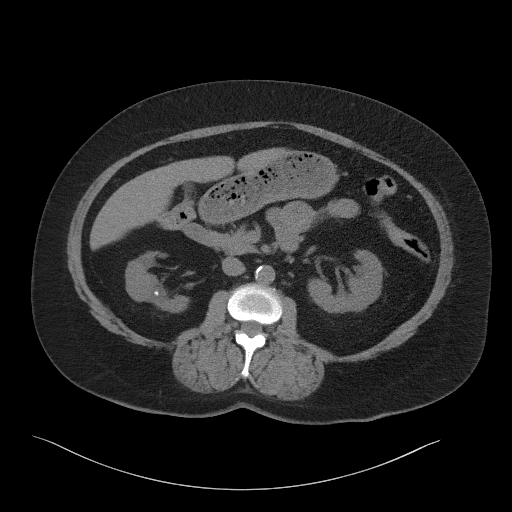
[im 62/93  bone]
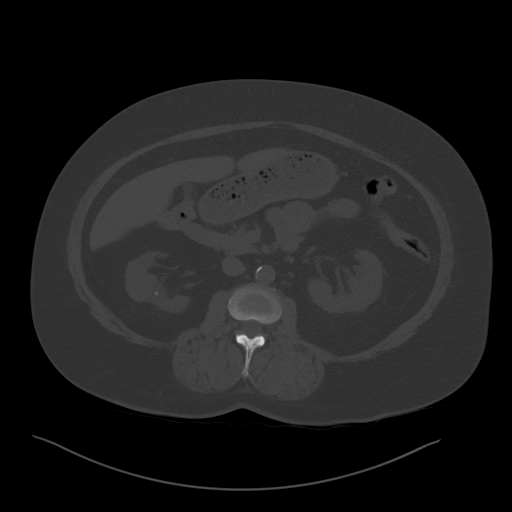
[im 74/93  soft-tissue]
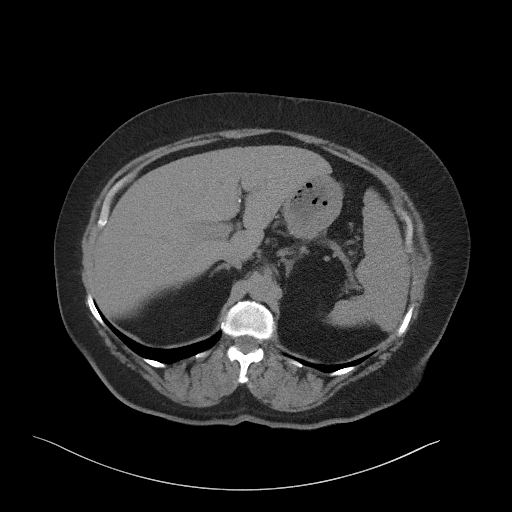
[im 80/93  soft-tissue]
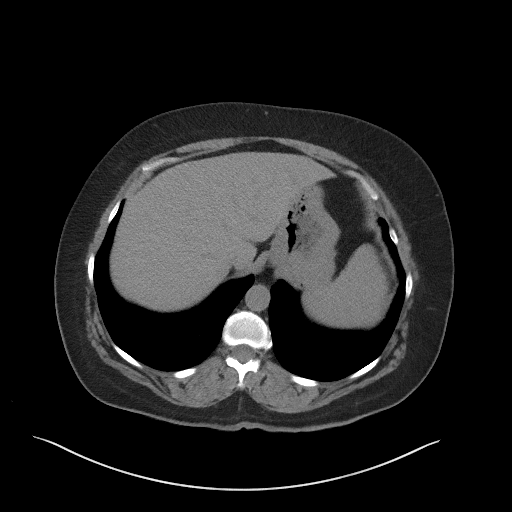
[im 86/93  soft-tissue]
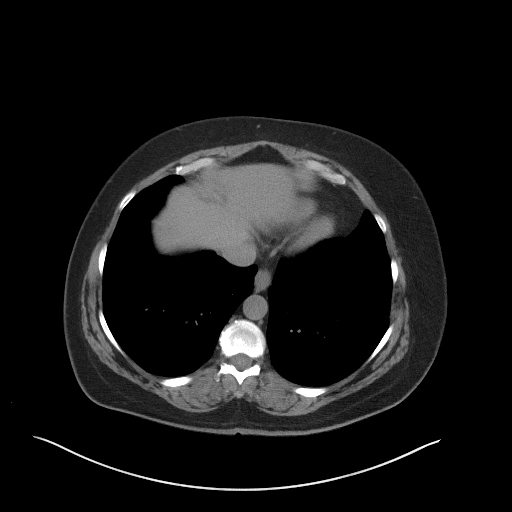

[Series 5: coronal st · coronal · 0.90mm/px · 3 of 123 slices shown]
[im 41/123  soft-tissue]
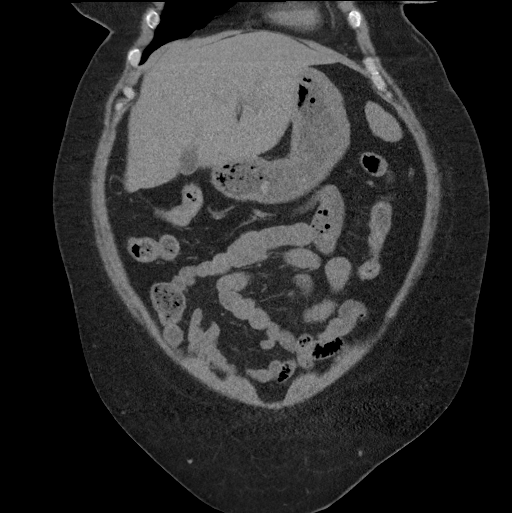
[im 55/123  soft-tissue]
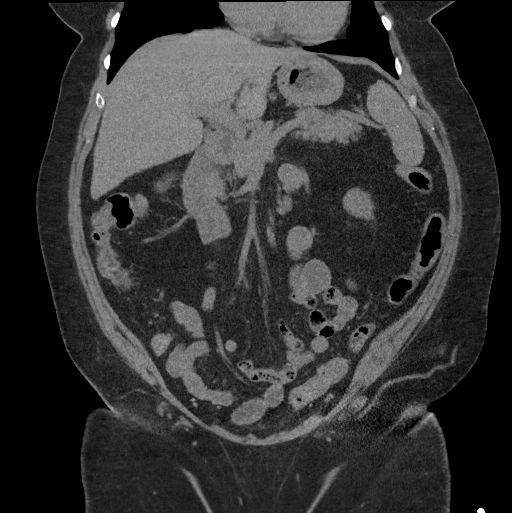
[im 68/123  soft-tissue]
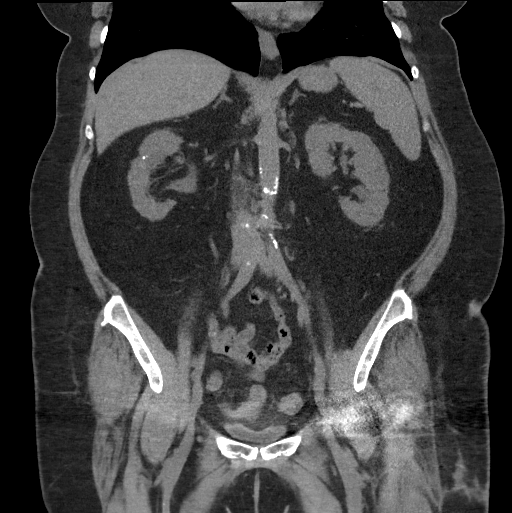

[15 of 46 positions shown; findings below may reference images not displayed]

FINDINGS: Lower chest: Lung bases are clear. Normal heart size. No pericardial
effusion.

Hepatobiliary: No focal liver abnormality is seen on this
noncontrast CT exam. Smooth liver surface contour. Hepatic
attenuation within normal limits. Few punctate calcifications
anterior to the liver ([DATE]) and along the falciform ligament
([DATE]), nonspecific but almost certainly benign. No gallstones,
gallbladder wall thickening, or biliary dilatation.

Pancreas: Calcification in body of the pancreas ([DATE]) could reflect
sequela of prior pancreatitis. Pancreas otherwise unremarkable
without peripancreatic inflammation or ductal dilatation.

Spleen: Normal in size without focal abnormality.

Adrenals/Urinary Tract: Normal adrenal glands lobular contours of
both kidneys likely reflect areas of chronic scarring, right greater
than left. No visible or discernible contour deforming renal lesion.
Mild bilateral symmetric perinephric stranding, a nonspecific
finding though may correlate with either age or decreased renal
function. Focal dilatation of the right upper pole calyx without
other calyceal dilatation or frank hydronephrosis. Slight asymmetric
right ureterectasis as well. No obstructing calculus along the right
urinary tract. Left urinary tract caliber is more normal in
appearance. Bilateral nonobstructing calculi present in both
kidneys. Urinary bladder is largely decompressed at the time of exam
and therefore poorly evaluated by CT imaging. Bladder also partially
obscured by streak artifact from left hip prosthesis. No visible
bladder calculi or debris.

Stomach/Bowel: Distal esophagus, stomach and duodenal sweep are
unremarkable. No small bowel wall thickening or dilatation. No
evidence of obstruction. A normal appendix is visualized. No colonic
dilatation or wall thickening. Scattered colonic diverticula without
focal pericolonic inflammation to suggest diverticulitis.

Vascular/Lymphatic: Atherosclerotic calcifications throughout the
abdominal aorta and branch vessels. No aneurysm or ectasia. No
enlarged abdominopelvic lymph nodes.

Reproductive: Retroverted, slightly retroflexed uterus. No
concerning adnexal lesions.

Other: No abdominopelvic free fluid or free gas. No bowel containing
hernias. Postsurgical changes along the left anterolateral thigh.

Musculoskeletal: Postsurgical changes from prior left hip
arthroplasty no acute hardware complication. No acute osseous
abnormality or suspicious osseous lesion. Multilevel degenerative
changes are present in the imaged portions of the spine. Features
most pronounced L4-5. At least mild to moderate bilateral foraminal
narrowing L4-5 with left L5 pars defect. Additional degenerative
changes in the SI joints and right hip.
IMPRESSION: 1. Focal dilatation of the right upper pole calyx without other
calyceal dilatation or frank hydronephrosis. Slight asymmetric right
ureterectasis as well. No obstructing calculus along the right
urinary tract. Findings may represent sequela of a recently passed
stone or ascending infection. Correlate with urinalysis.
2. Additional bilateral nonobstructing nephrolithiasis.
3. Colonic diverticulosis without evidence of diverticulitis.
4. Degenerative changes in the spine maximal L4-5 with left L5 pars
defect and mild to moderate bilateral foraminal narrowing.
5. Aortic Atherosclerosis ([id number]).

## 2020-07-22 ENCOUNTER — Other Ambulatory Visit: Payer: Self-pay

## 2020-07-22 ENCOUNTER — Encounter (HOSPITAL_COMMUNITY): Payer: Self-pay | Admitting: *Deleted

## 2020-07-22 ENCOUNTER — Emergency Department (HOSPITAL_COMMUNITY)
Admission: EM | Admit: 2020-07-22 | Discharge: 2020-07-22 | Disposition: A | Discharge status: Home / Self Care | Attending: Emergency Medicine | Admitting: Emergency Medicine

## 2020-07-22 DIAGNOSIS — R220 Localized swelling, mass and lump, head: Secondary | ICD-10-CM | POA: Diagnosis present

## 2020-07-22 DIAGNOSIS — Z7982 Long term (current) use of aspirin: Secondary | ICD-10-CM | POA: Diagnosis not present

## 2020-07-22 DIAGNOSIS — I2510 Atherosclerotic heart disease of native coronary artery without angina pectoris: Secondary | ICD-10-CM | POA: Diagnosis not present

## 2020-07-22 DIAGNOSIS — Z87891 Personal history of nicotine dependence: Secondary | ICD-10-CM | POA: Insufficient documentation

## 2020-07-22 DIAGNOSIS — I252 Old myocardial infarction: Secondary | ICD-10-CM | POA: Insufficient documentation

## 2020-07-22 DIAGNOSIS — Z79899 Other long term (current) drug therapy: Secondary | ICD-10-CM | POA: Insufficient documentation

## 2020-07-22 DIAGNOSIS — K047 Periapical abscess without sinus: Secondary | ICD-10-CM | POA: Insufficient documentation

## 2020-07-22 DIAGNOSIS — Z96642 Presence of left artificial hip joint: Secondary | ICD-10-CM | POA: Insufficient documentation

## 2020-07-22 DIAGNOSIS — I10 Essential (primary) hypertension: Secondary | ICD-10-CM | POA: Insufficient documentation

## 2020-07-22 DIAGNOSIS — D649 Anemia, unspecified: Secondary | ICD-10-CM | POA: Diagnosis not present

## 2020-07-22 DIAGNOSIS — E119 Type 2 diabetes mellitus without complications: Secondary | ICD-10-CM | POA: Diagnosis not present

## 2020-07-22 MED ORDER — CLINDAMYCIN HCL 300 MG PO CAPS
300.0000 mg | ORAL_CAPSULE | Freq: Four times a day (QID) | ORAL | 0 refills | Status: DC
Start: 2020-07-22 — End: 2021-03-20

## 2020-07-22 MED ORDER — CLINDAMYCIN PHOSPHATE 900 MG/50ML IV SOLN: 900.0000 mg | Freq: Once | INTRAVENOUS | Status: DC

## 2020-07-22 MED ORDER — CLINDAMYCIN HCL 150 MG PO CAPS
300.0000 mg | ORAL_CAPSULE | Freq: Once | ORAL | Status: AC
  Administered 2020-07-22: 300 mg via ORAL
  Filled 2020-07-22: qty 2

## 2020-07-22 NOTE — ED Triage Notes (Signed)
Pt states she went to bed last night normal and when she woke up this am she had significant swelling to right side of her face including her eye; pt denies any tooth pain or broken teeth

## 2020-07-22 NOTE — Discharge Instructions (Addendum)
Your right-sided facial swelling is likely due to a dental infection.  I recommend that you follow-up with your dentist next week.  Take the antibiotic as directed until it is finished.  You may take Tylenol if needed for pain.  Return to the emergency department for any new or worsening symptoms.

## 2020-07-22 NOTE — ED Provider Notes (Signed)
[facility] Provider Note   CSN: [id number] Arrival date & time: 07/22/20  1442     History Chief Complaint  Patient presents with  . Facial Swelling    Natalie Tanner is a 60 y.o. female.  HPI     Natalie T[facility] is a 60 y.o. female with past medical history of hypertension, anemia, and type 2 diabetes who presents to the Emergency Department complaining of sudden onset of right-sided facial swelling.  States that she went to bed last evening and her face appeared "normal."  This morning, she woke with swelling that extends from her mandible up to just below her right eye.  She notes tenderness to palpation of her cheek area.  Denies any known dental pain or injury.  No neck pain, fever, chills, visual changes, headache or vomiting.  No difficulty swallowing or shortness of breath.  Past Medical History:  Diagnosis Date  . Acute renal failure (ARF) [facility])    June 2021 - taken off ACE inhibitor and Celebrex  . Anxiety   . Coronary atherosclerosis of native coronary artery    Managed medically following cardiac catheterization 5/12  . Depression   . Essential hypertension   . GERD (gastroesophageal reflux disease)   . History of kidney stones   . Iron deficiency anemia    Secondary to menometrorrhagia  . Mixed hyperlipidemia   . Myocardial infarction [facility])    NSTEMI 5/12  . Nephrolithiasis   . Stroke ([place]) 2017  . Type 2 diabetes mellitus [facility])     Patient Active Problem List   Diagnosis Date Noted  . AKI (acute kidney injury) ([place]) 08/10/2019  . Acute midline low back pain with left-sided sciatica   . Hypotension 05/08/2019  . Acute blood loss anemia 05/08/2019  . Type 2 diabetes mellitus with vascular disease ([place]) 05/08/2019  . Hx of total hip arthroplasty, left 05/07/2019  . Plantar fasciitis, right 09/16/2018  . Achilles tendon disorder, right 09/16/2018  . Concussion with loss of consciousness 11/17/2017  . Other complicated  headache syndrome 11/17/2017  . Osteoarthritis of finger 03/14/2017  . Trigger finger of left thumb 11/27/2016  . Calcification of tendon 11/27/2016  . Arthralgia of both hands 08/26/2016  . Bilateral hearing loss 06/26/2015  . 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 Coronary atherosclerosis of native coronary artery 08/13/2010  . Diabetes ([place]) 08/13/2010  . Mixed hyperlipidemia 08/13/2010  . Essential hypertension, benign 08/13/2010  . Iron deficiency anemia 08/13/2010    Past Surgical History:  Procedure Laterality Date  . BACK SURGERY    . KNEE SURGERY    . SINUS SURGERY WITH INSTATRAK    . TOTAL HIP ARTHROPLASTY Left 05/07/2019   Procedure: LEFT TOTAL HIP ARTHROPLASTY DIRECT ANTERIOR;  Surgeon: Marybelle Killings, MD;  Location: [facility];  Service: Orthopedics;  Laterality: Left;     OB History   No obstetric history on file.     Family History  Problem Relation Age of Onset  . Other Father        Clostridium difficile  . Stroke Father     Social History   Tobacco Use  . Smoking status: Former Smoker    Packs/day: 1.00    Years: 20.00    Pack years: 20.00    Types: Cigarettes    Quit date: 03/04/2001    Years since quitting: 19.3  . Smokeless tobacco: Never Used  Vaping Use  . Vaping Use: Never used  Substance Use Topics  .  Alcohol use: No  . Drug use: No    Home Medications Prior to Admission medications   Medication Sig Start Date End Date Taking? Authorizing Provider  clindamycin (CLEOCIN) 300 MG capsule Take 1 capsule (300 mg total) by mouth 4 (four) times daily. 07/22/20  Yes Hassaan Crite, PA-C  ALPRAZolam (XANAX) 1 MG tablet Take 1 tablet (1 mg total) by mouth 2 (two) times daily as needed. 01/25/20   Claretta Fraise, MD  amLODipine (NORVASC) 5 MG tablet Take 1 tablet (5 mg total) by mouth daily. For blood pressure 01/25/20   Claretta Fraise, MD  aspirin EC 81 MG tablet Take 81 mg by mouth daily. Swallow whole.    [provider]  blood glucose meter kit and  supplies KIT Dispense based on patient and insurance preference. Use up to four times daily as directed. For diabetes E11.9, with hyperglycemia. 11/26/16   Terald Sleeper, PA-C  CELEBREX 200 MG capsule TAKE ONE CAPSULE BY MOUTH EVERY DAY WITH FOOD 04/26/20   Claretta Fraise, MD  empagliflozin (JARDIANCE) 25 MG TABS tablet Take 1 tablet (25 mg total) by mouth daily. 01/25/20   Claretta Fraise, MD  ferrous sulfate 325 (65 FE) MG tablet Take 1 tablet (325 mg total) by mouth 3 (three) times daily after meals. 05/09/19   Jessy Oto, MD  gabapentin (NEURONTIN) 300 MG capsule Take 1 capsule (300 mg total) by mouth 3 (three) times daily. Patient taking differently: Take 300 mg by mouth 3 (three) times daily. Pt states takes bid 02/22/19   Evelina Dun A, FNP  meclizine (ANTIVERT) 25 MG tablet Take 1 tablet (25 mg total) by mouth 3 (three) times daily as needed for dizziness. 05/23/16   Terald Sleeper, PA-C  metoprolol tartrate (LOPRESSOR) 25 MG tablet TAKE ONE-HALF TABLET BY MOUTH TWICE A DAY 04/26/20   Claretta Fraise, MD  nitroGLYCERIN (NITROSTAT) 0.4 MG SL tablet Place 1 tablet (0.4 mg total) under the tongue every 5 (five) minutes as needed for chest pain. 09/16/18   Terald Sleeper, PA-C  omeprazole (PRILOSEC OTC) 20 MG tablet Take 1 tablet (20 mg total) by mouth daily. 04/26/20   Claretta Fraise, MD  PARoxetine (PAXIL) 20 MG tablet Take 1 tablet (20 mg total) by mouth every morning. 04/26/20   Claretta Fraise, MD  pioglitazone (ACTOS) 30 MG tablet TAKE ONE TABLET BY MOUTH EVERY DAY 04/26/20   Claretta Fraise, MD  rosuvastatin (CRESTOR) 10 MG tablet Take 1 tablet (10 mg total) by mouth daily. 05/03/19 01/25/20  Donato Heinz, MD    Allergies    Patient has no known allergies.  Review of Systems   Review of Systems  Constitutional: Negative for chills, fatigue and fever.  HENT: Positive for facial swelling. Negative for congestion, sinus pressure, sore throat and trouble swallowing.   Eyes: Negative  for photophobia, pain, discharge and redness.  Respiratory: Negative for cough and shortness of breath.   Cardiovascular: Negative for chest pain.  Gastrointestinal: Negative for abdominal pain, nausea and vomiting.  Genitourinary: Negative for dysuria, flank pain and hematuria.  Musculoskeletal: Negative for arthralgias, back pain, myalgias, neck pain and neck stiffness.  Skin: Negative for rash.  Neurological: Negative for dizziness, syncope, weakness and numbness.  Hematological: Does not bruise/bleed easily.    Physical Exam Updated Vital Signs BP (!) 139/96   Pulse 94   Temp 98.7 F (37.1 C) (Oral)   Resp 18   Ht 5' 3"  (1.6 m)   Wt 93.4 kg  LMP 07/05/2010   SpO2 96%   BMI 36.49 kg/m   Physical Exam Vitals and nursing note reviewed.  Constitutional:      Appearance: Normal appearance. She is not ill-appearing or toxic-appearing.  HENT:     Head: Normocephalic.     Right Ear: Tympanic membrane and ear canal normal.     Left Ear: Tympanic membrane and ear canal normal.     Mouth/Throat:     Mouth: Mucous membranes are moist.     Dentition: Dental tenderness and gingival swelling present.     Pharynx: Oropharynx is clear. Uvula midline. No uvula swelling.     Comments: Tenderness palpation of the right maxillary teeth, primarily over the lateral incisor and first premolar with tenderness of the gums.  No fluctuance or tenting.  Uvula midline, no edema of lips or tongue.  No trismus. Eyes:     Pupils: Pupils are equal, round, and reactive to light.  Neck:     Thyroid: No thyromegaly.     Meningeal: Kernig's sign absent.  Cardiovascular:     Rate and Rhythm: Normal rate and regular rhythm.     Heart sounds: Normal heart sounds.  Pulmonary:     Effort: Pulmonary effort is normal.     Breath sounds: Normal breath sounds. No wheezing.  Abdominal:     Palpations: Abdomen is soft.     Tenderness: There is no abdominal tenderness. There is no guarding or rebound.   Musculoskeletal:        General: Normal range of motion.     Cervical back: Normal range of motion and neck supple.  Skin:    General: Skin is warm.     Findings: No rash.  Neurological:     Mental Status: She is alert.     Sensory: No sensory deficit.     Motor: No weakness.     ED Results / Procedures / Treatments   Labs (all labs ordered are listed, but only abnormal results are displayed) Labs Reviewed - No data to display  EKG None  Radiology No results found.  Procedures Procedures   Medications Ordered in ED Medications  clindamycin (CLEOCIN) capsule 300 mg (300 mg Oral Given 07/22/20 1558)    ED Course  I have reviewed the triage vital signs and the nursing notes.  Pertinent labs & imaging results that were available during my care of the patient were reviewed by me and considered in my medical decision making (see chart for details).    MDM Rules/Calculators/A&P                          Patient here with sudden onset of right-sided facial swelling.  No neck pain, fever, chills, shortness of breath or difficulty swallowing.  No edema of the lips or tongue.  No concerning symptoms for angioedema.  No airway compromise.  She does have tenderness palpation of the gums surrounding the right maxillary teeth.  Symptoms likely secondary to developing dental abscess.  No drainable abscess noted at this time.  Patient well-appearing, nontoxic.  Patient seen by Dr. Roderic Palau and care plan discussed.  Patient will be treated with antibiotics and she agrees to close follow-up with her dentist on Monday.  Strict return precautions were discussed.   Final Clinical Impression(s) / ED Diagnoses Final diagnoses:  Dental abscess  Facial swelling    Rx / DC Orders ED Discharge Orders         Ordered  clindamycin (CLEOCIN) 300 MG capsule  4 times daily        07/22/20 1600           Kem Parkinson, PA-C 07/22/20 1736    Milton Ferguson, MD 07/22/20 2316

## 2020-08-11 ENCOUNTER — Other Ambulatory Visit: Payer: Self-pay | Admitting: Family Medicine

## 2020-08-11 DIAGNOSIS — M159 Polyosteoarthritis, unspecified: Secondary | ICD-10-CM

## 2020-08-11 DIAGNOSIS — E1165 Type 2 diabetes mellitus with hyperglycemia: Secondary | ICD-10-CM

## 2020-08-29 ENCOUNTER — Ambulatory Visit (INDEPENDENT_AMBULATORY_CARE_PROVIDER_SITE_OTHER): Admitting: Family Medicine

## 2020-08-29 ENCOUNTER — Encounter: Payer: Self-pay | Admitting: Family Medicine

## 2020-08-29 ENCOUNTER — Other Ambulatory Visit: Payer: Self-pay

## 2020-08-29 VITALS — BP 128/72 | HR 73 | Temp 97.7°F | Ht 63.0 in | Wt 208.6 lb

## 2020-08-29 DIAGNOSIS — F419 Anxiety disorder, unspecified: Secondary | ICD-10-CM | POA: Diagnosis not present

## 2020-08-29 DIAGNOSIS — E782 Mixed hyperlipidemia: Secondary | ICD-10-CM | POA: Diagnosis not present

## 2020-08-29 DIAGNOSIS — I10 Essential (primary) hypertension: Secondary | ICD-10-CM

## 2020-08-29 DIAGNOSIS — E1159 Type 2 diabetes mellitus with other circulatory complications: Secondary | ICD-10-CM | POA: Diagnosis not present

## 2020-08-29 DIAGNOSIS — M5432 Sciatica, left side: Secondary | ICD-10-CM

## 2020-08-29 LAB — BAYER DCA HB A1C WAIVED: HB A1C (BAYER DCA - WAIVED): 6.9 % (ref <7.0)

## 2020-08-29 MED ORDER — AMLODIPINE BESYLATE 5 MG PO TABS: 5.0000 mg | ORAL_TABLET | Freq: Every day | ORAL | 3 refills | Status: DC

## 2020-08-29 MED ORDER — ALPRAZOLAM 1 MG PO TABS: 1.0000 mg | ORAL_TABLET | Freq: Two times a day (BID) | ORAL | 1 refills | Status: DC | PRN

## 2020-08-29 MED ORDER — GABAPENTIN 300 MG PO CAPS: 300.0000 mg | ORAL_CAPSULE | Freq: Three times a day (TID) | ORAL | 3 refills | Status: DC

## 2020-08-29 MED ORDER — OMEPRAZOLE MAGNESIUM 20 MG PO TBEC: 20.0000 mg | DELAYED_RELEASE_TABLET | Freq: Every day | ORAL | 3 refills | Status: DC

## 2020-08-29 MED ORDER — EMPAGLIFLOZIN 25 MG PO TABS: 25.0000 mg | ORAL_TABLET | Freq: Every day | ORAL | 3 refills | Status: DC

## 2020-08-29 NOTE — Progress Notes (Signed)
Subjective:  Patient ID: [facility],  female    DOB: 1960/12/27  Age: 60 y.o.    CC: Medical Management of Chronic Issues   HPI Gearldene Fiorenza presents for  follow-up of hypertension. Patient has no history of headache chest pain or shortness of breath or recent cough. Patient also denies symptoms of TIA such as numbness weakness lateralizing. Patient denies side effects from medication. States taking it regularly.  Patient also  in for follow-up of elevated cholesterol. Doing well without complaints on current medication. Denies side effects  including myalgia and arthralgia and nausea. Also in today for liver function testing. Currently no chest pain, shortness of breath or other cardiovascular related symptoms noted.  Follow-up of diabetes. Patient does check blood sugar at home. Readings run between 100 and 150 Patient denies symptoms such as excessive hunger or urinary frequency, excessive hunger, nausea No significant hypoglycemic spells noted. Medications reviewed. Pt reports taking them regularly. Pt. denies complication/adverse reaction today.    History Kyrstin has a past medical history of Acute renal failure (ARF) ([place]), Anxiety, Coronary atherosclerosis of native coronary artery, Depression, Essential hypertension, GERD (gastroesophageal reflux disease), History of kidney stones, Iron deficiency anemia, Mixed hyperlipidemia, Myocardial infarction [facility]), Nephrolithiasis, Stroke ([place]) (2017), and Type 2 diabetes mellitus ([place]).   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   Her family history includes Other in her father; Stroke in her father.She reports that she quit smoking about 19 years ago. Her smoking use included cigarettes. She has a 20.00 pack-year smoking history. She has never used smokeless tobacco. She reports that she does not drink alcohol and  does not use drugs.  Current Outpatient Medications on File Prior to Visit  Medication Sig Dispense Refill   aspirin EC 81 MG tablet Take 81 mg by mouth daily. Swallow whole.     blood glucose meter kit and supplies KIT Dispense based on patient and insurance preference. Use up to four times daily as directed. For diabetes E11.9, with hyperglycemia. 90 each 11   CELEBREX 200 MG capsule TAKE ONE CAPSULE BY MOUTH EVERY DAY WITH FOOD 90 capsule 0   clindamycin (CLEOCIN) 300 MG capsule Take 1 capsule (300 mg total) by mouth 4 (four) times daily. 28 capsule 0   ferrous sulfate 325 (65 FE) MG tablet Take 1 tablet (325 mg total) by mouth 3 (three) times daily after meals. 90 tablet 1   meclizine (ANTIVERT) 25 MG tablet Take 1 tablet (25 mg total) by mouth 3 (three) times daily as needed for dizziness. 90 tablet 11   metoprolol tartrate (LOPRESSOR) 25 MG tablet TAKE ONE-HALF TABLET BY MOUTH TWICE A DAY 90 tablet 0   nitroGLYCERIN (NITROSTAT) 0.4 MG SL tablet Place 1 tablet (0.4 mg total) under the tongue every 5 (five) minutes as needed for chest pain. 25 tablet 2   PARoxetine (PAXIL) 20 MG tablet TAKE ONE TABLET BY MOUTH EVERY DAY IN THE MORNING 90 tablet 0   pioglitazone (ACTOS) 30 MG tablet TAKE ONE TABLET BY MOUTH EVERY DAY 90 tablet 0   rosuvastatin (CRESTOR) 10 MG tablet Take 1 tablet (10 mg total) by mouth daily. 90 tablet 3   No current facility-administered medications on file prior to visit.    ROS Review of Systems  Constitutional: Negative.   HENT: Negative.    Eyes:  Negative for visual disturbance.  Respiratory:  Negative for shortness  of breath.   Cardiovascular:  Negative for chest pain.  Gastrointestinal:  Negative for abdominal pain.  Musculoskeletal:  Negative for arthralgias.   Objective:  BP 128/72   Pulse 73   Temp 97.7 F (36.5 C)   Ht 5' 3"  (1.6 m)   Wt 208 lb 9.6 oz (94.6 kg)   LMP 07/05/2010   SpO2 99%   BMI 36.95 kg/m   BP Readings from Last 3 Encounters:   08/29/20 128/72  07/22/20 (!) 139/96  01/25/20 133/88    Wt Readings from Last 3 Encounters:  08/29/20 208 lb 9.6 oz (94.6 kg)  07/22/20 206 lb (93.4 kg)  01/25/20 203 lb (92.1 kg)     Physical Exam Constitutional:      General: She is not in acute distress.    Appearance: She is well-developed.  Cardiovascular:     Rate and Rhythm: Normal rate and regular rhythm.  Pulmonary:     Breath sounds: Normal breath sounds.  Musculoskeletal:        General: Normal range of motion.  Skin:    General: Skin is warm and dry.  Neurological:     Mental Status: She is alert and oriented to person, place, and time.    Diabetic Foot Exam - Simple   No data filed       Assessment & Plan:   Blanch was seen today for medical management of chronic issues.  Diagnoses and all orders for this visit:  Type 2 diabetes mellitus with vascular disease ([place]) -     Bayer DCA Hb A1c Waived -     CMP14+EGFR -     CBC with Differential/Platelet  Essential hypertension, benign -     CMP14+EGFR -     CBC with Differential/Platelet  Mixed hyperlipidemia -     Lipid panel  Anxiety -     ALPRAZolam (XANAX) 1 MG tablet; Take 1 tablet (1 mg total) by mouth 2 (two) times daily as needed. -     ToxASSURE Select 13 (MW), Urine  Left sciatic nerve pain -     gabapentin (NEURONTIN) 300 MG capsule; Take 1 capsule (300 mg total) by mouth 3 (three) times daily.  Other orders -     amLODipine (NORVASC) 5 MG tablet; Take 1 tablet (5 mg total) by mouth daily. For blood pressure -     empagliflozin (JARDIANCE) 25 MG TABS tablet; Take 1 tablet (25 mg total) by mouth daily. -     omeprazole (PRILOSEC OTC) 20 MG tablet; Take 1 tablet (20 mg total) by mouth daily.  I am having Apoorva R. Leandro maintain her meclizine, blood glucose meter kit and supplies, nitroGLYCERIN, rosuvastatin, ferrous sulfate, aspirin EC, metoprolol tartrate, clindamycin, CeleBREX, PARoxetine, pioglitazone, ALPRAZolam, amLODipine,  empagliflozin, gabapentin, and omeprazole.  Meds ordered this encounter  Medications   ALPRAZolam (XANAX) 1 MG tablet    Sig: Take 1 tablet (1 mg total) by mouth 2 (two) times daily as needed.    Dispense:  180 tablet    Refill:  1   amLODipine (NORVASC) 5 MG tablet    Sig: Take 1 tablet (5 mg total) by mouth daily. For blood pressure    Dispense:  90 tablet    Refill:  3   empagliflozin (JARDIANCE) 25 MG TABS tablet    Sig: Take 1 tablet (25 mg total) by mouth daily.    Dispense:  90 tablet    Refill:  3   gabapentin (NEURONTIN) 300 MG capsule  Sig: Take 1 capsule (300 mg total) by mouth 3 (three) times daily.    Dispense:  90 capsule    Refill:  3   omeprazole (PRILOSEC OTC) 20 MG tablet    Sig: Take 1 tablet (20 mg total) by mouth daily.    Dispense:  90 tablet    Refill:  3     Follow-up: Return in about 3 months (around 11/29/2020).  Claretta Fraise, M.D.

## 2020-08-30 LAB — LIPID PANEL
Chol/HDL Ratio: 3.2 ratio (ref 0.0–4.4)
Cholesterol, Total: 157 mg/dL (ref 100–199)
HDL: 49 mg/dL (ref >39)
LDL Chol Calc (NIH): 83 mg/dL (ref 0–99)
Triglycerides: 146 mg/dL (ref 0–149)
VLDL Cholesterol Cal: 25 mg/dL (ref 5–40)

## 2020-08-30 LAB — CMP14+EGFR
ALT: 16 IU/L (ref 0–32)
AST: 17 IU/L (ref 0–40)
Albumin/Globulin Ratio: 1.9 (ref 1.2–2.2)
Albumin: 4.6 g/dL (ref 3.8–4.9)
Alkaline Phosphatase: 65 IU/L (ref 44–121)
BUN/Creatinine Ratio: 13 (ref 9–23)
BUN: 13 mg/dL (ref 6–24)
Bilirubin Total: 0.6 mg/dL (ref 0.0–1.2)
CO2: 23 mmol/L (ref 20–29)
Calcium: 9.6 mg/dL (ref 8.7–10.2)
Chloride: 101 mmol/L (ref 96–106)
Creatinine, Ser: 1.02 mg/dL — ABNORMAL HIGH (ref 0.57–1.00)
Globulin, Total: 2.4 g/dL (ref 1.5–4.5)
Glucose: 145 mg/dL — ABNORMAL HIGH (ref 65–99)
Potassium: 5 mmol/L (ref 3.5–5.2)
Sodium: 138 mmol/L (ref 134–144)
Total Protein: 7 g/dL (ref 6.0–8.5)
eGFR: 63 mL/min/{1.73_m2} (ref >59)

## 2020-08-30 LAB — CBC WITH DIFFERENTIAL/PLATELET
Basophils Absolute: 0.1 10*3/uL (ref 0.0–0.2)
Basos: 1 %
EOS (ABSOLUTE): 0.2 10*3/uL (ref 0.0–0.4)
Eos: 4 %
Hematocrit: 42.1 % (ref 34.0–46.6)
Hemoglobin: 14.3 g/dL (ref 11.1–15.9)
Immature Grans (Abs): 0 10*3/uL (ref 0.0–0.1)
Immature Granulocytes: 0 %
Lymphocytes Absolute: 1.7 10*3/uL (ref 0.7–3.1)
Lymphs: 29 %
MCH: 31.5 pg (ref 26.6–33.0)
MCHC: 34 g/dL (ref 31.5–35.7)
MCV: 93 fL (ref 79–97)
Monocytes Absolute: 0.3 10*3/uL (ref 0.1–0.9)
Monocytes: 5 %
Neutrophils Absolute: 3.7 10*3/uL (ref 1.4–7.0)
Neutrophils: 61 %
Platelets: 250 10*3/uL (ref 150–450)
RBC: 4.54 x10E6/uL (ref 3.77–5.28)
RDW: 12.7 % (ref 11.7–15.4)
WBC: 6 10*3/uL (ref 3.4–10.8)

## 2020-08-31 ENCOUNTER — Other Ambulatory Visit: Payer: Self-pay | Admitting: *Deleted

## 2020-08-31 ENCOUNTER — Telehealth: Payer: Self-pay | Admitting: Family Medicine

## 2020-08-31 DIAGNOSIS — F419 Anxiety disorder, unspecified: Secondary | ICD-10-CM

## 2020-08-31 DIAGNOSIS — M5432 Sciatica, left side: Secondary | ICD-10-CM

## 2020-08-31 MED ORDER — GABAPENTIN 300 MG PO CAPS: 300.0000 mg | ORAL_CAPSULE | Freq: Three times a day (TID) | ORAL | 3 refills | Status: DC

## 2020-08-31 MED ORDER — AMLODIPINE BESYLATE 5 MG PO TABS: 5.0000 mg | ORAL_TABLET | Freq: Every day | ORAL | 3 refills | Status: DC

## 2020-08-31 MED ORDER — ALPRAZOLAM 1 MG PO TABS: 1.0000 mg | ORAL_TABLET | Freq: Two times a day (BID) | ORAL | 1 refills | Status: DC | PRN

## 2020-08-31 MED ORDER — OMEPRAZOLE MAGNESIUM 20 MG PO TBEC: 20.0000 mg | DELAYED_RELEASE_TABLET | Freq: Every day | ORAL | 3 refills | Status: DC

## 2020-08-31 MED ORDER — EMPAGLIFLOZIN 25 MG PO TABS: 25.0000 mg | ORAL_TABLET | Freq: Every day | ORAL | 3 refills | Status: DC

## 2020-08-31 NOTE — Telephone Encounter (Signed)
Patient called stating that she recently had a visit with Dr Livia Snellen and her med refills were supposed to be sent to [facility] but instead they were sent to [place].  Please send med refills to [facility]

## 2020-08-31 NOTE — Telephone Encounter (Signed)
Done

## 2020-09-01 NOTE — Progress Notes (Signed)
Hello Madalina,  Your lab result is normal and/or stable.Some minor variations that are not significant are commonly marked abnormal, but do not represent any medical problem for you.  Best regards, Savreen Gebhardt, M.D.

## 2020-09-06 LAB — TOXASSURE SELECT 13 (MW), URINE

## 2020-11-21 ENCOUNTER — Encounter: Payer: Self-pay | Admitting: Family Medicine

## 2020-11-21 ENCOUNTER — Ambulatory Visit (INDEPENDENT_AMBULATORY_CARE_PROVIDER_SITE_OTHER): Admitting: Family Medicine

## 2020-11-21 ENCOUNTER — Other Ambulatory Visit: Payer: Self-pay

## 2020-11-21 VITALS — BP 134/79 | HR 87 | Temp 98.1°F | Ht 63.0 in | Wt 214.0 lb

## 2020-11-21 DIAGNOSIS — E1121 Type 2 diabetes mellitus with diabetic nephropathy: Secondary | ICD-10-CM

## 2020-11-21 DIAGNOSIS — M8949 Other hypertrophic osteoarthropathy, multiple sites: Secondary | ICD-10-CM | POA: Diagnosis not present

## 2020-11-21 DIAGNOSIS — I10 Essential (primary) hypertension: Secondary | ICD-10-CM | POA: Diagnosis not present

## 2020-11-21 DIAGNOSIS — E782 Mixed hyperlipidemia: Secondary | ICD-10-CM | POA: Diagnosis not present

## 2020-11-21 DIAGNOSIS — E1165 Type 2 diabetes mellitus with hyperglycemia: Secondary | ICD-10-CM

## 2020-11-21 DIAGNOSIS — M159 Polyosteoarthritis, unspecified: Secondary | ICD-10-CM

## 2020-11-21 LAB — BAYER DCA HB A1C WAIVED: HB A1C (BAYER DCA - WAIVED): 6.8 % — ABNORMAL HIGH (ref 4.8–5.6)

## 2020-11-21 MED ORDER — CELECOXIB 200 MG PO CAPS: 200.0000 mg | ORAL_CAPSULE | Freq: Every day | ORAL | 3 refills | Status: DC

## 2020-11-21 MED ORDER — PAROXETINE HCL 20 MG PO TABS: 20.0000 mg | ORAL_TABLET | Freq: Every morning | ORAL | 3 refills | Status: DC

## 2020-11-21 MED ORDER — PIOGLITAZONE HCL 30 MG PO TABS: 30.0000 mg | ORAL_TABLET | Freq: Every day | ORAL | 3 refills | Status: DC

## 2020-11-21 MED ORDER — METOPROLOL TARTRATE 25 MG PO TABS: 12.5000 mg | ORAL_TABLET | Freq: Two times a day (BID) | ORAL | 3 refills | Status: DC

## 2020-11-21 NOTE — Progress Notes (Signed)
Subjective:  Patient ID: Natalie Tanner, female    DOB: 07-14-1960  Age: 60 y.o. MRN: [id number]  CC: Medical Management of Chronic Issues   HPI Natalie Tanner presents forFollow-up of diabetes. Patient checks blood sugar at home.  Patient denies symptoms such as polyuria, polydipsia, excessive hunger, nausea No significant hypoglycemic spells noted. Medications reviewed. Pt reports taking them regularly without complication/adverse reaction being reported today.  Checking feet daily. Chest pain lasting 5-10 min. Relieved within 5 min. Under stress at work. [employer]. Pt. Sees Dr. Domenic Polite of cardiology.  History Natalie Tanner has a past medical history of Acute renal failure (ARF) ([place]), Anxiety, Coronary atherosclerosis of native coronary artery, Depression, Essential hypertension, GERD (gastroesophageal reflux disease), History of kidney stones, Iron deficiency anemia, Mixed hyperlipidemia, Myocardial infarction [facility]), Nephrolithiasis, Stroke ([place]) (2017), and Type 2 diabetes mellitus ([place]).   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   Her family history includes Other in her father; Stroke in her father.She reports that she quit smoking about 19 years ago. Her smoking use included cigarettes. She has a 20.00 pack-year smoking history. She has never used smokeless tobacco. She reports that she does not drink alcohol and does not use drugs.  Current Outpatient Medications on File Prior to Visit  Medication Sig Dispense Refill   ALPRAZolam (XANAX) 1 MG tablet Take 1 tablet (1 mg total) by mouth 2 (two) times daily as needed. 180 tablet 1   amLODipine (NORVASC) 5 MG tablet Take 1 tablet (5 mg total) by mouth daily. For blood pressure 90 tablet 3   aspirin EC 81 MG tablet Take 81 mg by mouth daily. Swallow whole.     blood glucose meter kit and supplies KIT Dispense based on  patient and insurance preference. Use up to four times daily as directed. For diabetes E11.9, with hyperglycemia. 90 each 11   clindamycin (CLEOCIN) 300 MG capsule Take 1 capsule (300 mg total) by mouth 4 (four) times daily. 28 capsule 0   empagliflozin (JARDIANCE) 25 MG TABS tablet Take 1 tablet (25 mg total) by mouth daily. 90 tablet 3   ferrous sulfate 325 (65 FE) MG tablet Take 1 tablet (325 mg total) by mouth 3 (three) times daily after meals. 90 tablet 1   gabapentin (NEURONTIN) 300 MG capsule Take 1 capsule (300 mg total) by mouth 3 (three) times daily. 90 capsule 3   meclizine (ANTIVERT) 25 MG tablet Take 1 tablet (25 mg total) by mouth 3 (three) times daily as needed for dizziness. 90 tablet 11   nitroGLYCERIN (NITROSTAT) 0.4 MG SL tablet Place 1 tablet (0.4 mg total) under the tongue every 5 (five) minutes as needed for chest pain. 25 tablet 2   omeprazole (PRILOSEC OTC) 20 MG tablet Take 1 tablet (20 mg total) by mouth daily. 90 tablet 3   rosuvastatin (CRESTOR) 10 MG tablet Take 1 tablet (10 mg total) by mouth daily. 90 tablet 3   No current facility-administered medications on file prior to visit.    ROS Review of Systems  Constitutional: Negative.   HENT: Negative.    Eyes:  Negative for visual disturbance.  Respiratory:  Negative for shortness of breath.   Cardiovascular:  Positive for chest pain.  Gastrointestinal:  Negative for abdominal pain.  Musculoskeletal:  Negative for arthralgias.   Objective:  BP 134/79   Pulse 87   Temp 98.1 F (36.7 C)  Ht 5' 3" (1.6 m)   Wt 214 lb (97.1 kg)   LMP 07/05/2010   SpO2 97%   BMI 37.91 kg/m   BP Readings from Last 3 Encounters:  11/21/20 134/79  08/29/20 128/72  07/22/20 (!) 139/96    Wt Readings from Last 3 Encounters:  11/21/20 214 lb (97.1 kg)  08/29/20 208 lb 9.6 oz (94.6 kg)  07/22/20 206 lb (93.4 kg)     Physical Exam Constitutional:      General: She is not in acute distress.    Appearance: She is  well-developed.  HENT:     Head: Normocephalic and atraumatic.  Eyes:     Conjunctiva/sclera: Conjunctivae normal.     Pupils: Pupils are equal, round, and reactive to light.  Neck:     Thyroid: No thyromegaly.  Cardiovascular:     Rate and Rhythm: Normal rate and regular rhythm.     Heart sounds: Normal heart sounds. No murmur heard. Pulmonary:     Effort: Pulmonary effort is normal. No respiratory distress.     Breath sounds: Normal breath sounds. No wheezing or rales.  Abdominal:     General: Bowel sounds are normal. There is no distension.     Palpations: Abdomen is soft.     Tenderness: There is no abdominal tenderness.  Musculoskeletal:        General: Normal range of motion.     Cervical back: Normal range of motion and neck supple.  Lymphadenopathy:     Cervical: No cervical adenopathy.  Skin:    General: Skin is warm and dry.  Neurological:     Mental Status: She is alert and oriented to person, place, and time.  Psychiatric:        Behavior: Behavior normal.        Thought Content: Thought content normal.        Judgment: Judgment normal.      Assessment & Plan:   Natalie Tanner was seen today for medical management of chronic issues.  Diagnoses and all orders for this visit:  Mixed hyperlipidemia -     Lipid panel  Essential hypertension, benign -     CMP14+EGFR -     metoprolol tartrate (LOPRESSOR) 25 MG tablet; Take 0.5 tablets (12.5 mg total) by mouth 2 (two) times daily.  Type 2 diabetes mellitus with diabetic nephropathy, without long-term current use of insulin (HCC) -     Bayer DCA Hb A1c Waived -     CBC with Differential/Platelet  Primary osteoarthritis involving multiple joints -     celecoxib (CELEBREX) 200 MG capsule; Take 1 capsule (200 mg total) by mouth daily. with food  Type 2 diabetes mellitus with hyperglycemia, without long-term current use of insulin (HCC) -     pioglitazone (ACTOS) 30 MG tablet; Take 1 tablet (30 mg total) by mouth  daily.  Other orders -     PARoxetine (PAXIL) 20 MG tablet; Take 1 tablet (20 mg total) by mouth every morning.     I have changed Natalie Tanner's CeleBREX to celecoxib. I have also changed her metoprolol tartrate, PARoxetine, and pioglitazone. I am also having her maintain her meclizine, blood glucose meter kit and supplies, nitroGLYCERIN, rosuvastatin, ferrous sulfate, aspirin EC, clindamycin, amLODipine, empagliflozin, gabapentin, omeprazole, and ALPRAZolam.  Meds ordered this encounter  Medications   celecoxib (CELEBREX) 200 MG capsule    Sig: Take 1 capsule (200 mg total) by mouth daily. with food    Dispense:  90 capsule  Refill:  3   metoprolol tartrate (LOPRESSOR) 25 MG tablet    Sig: Take 0.5 tablets (12.5 mg total) by mouth 2 (two) times daily.    Dispense:  90 tablet    Refill:  3   PARoxetine (PAXIL) 20 MG tablet    Sig: Take 1 tablet (20 mg total) by mouth every morning.    Dispense:  90 tablet    Refill:  3   pioglitazone (ACTOS) 30 MG tablet    Sig: Take 1 tablet (30 mg total) by mouth daily.    Dispense:  90 tablet    Refill:  3     Follow-up: Return in about 3 months (around 02/20/2021).  Claretta Fraise, M.D.

## 2020-11-22 LAB — CBC WITH DIFFERENTIAL/PLATELET
Basophils Absolute: 0 10*3/uL (ref 0.0–0.2)
Basos: 1 %
EOS (ABSOLUTE): 0.2 10*3/uL (ref 0.0–0.4)
Eos: 4 %
Hematocrit: 43.3 % (ref 34.0–46.6)
Hemoglobin: 14.9 g/dL (ref 11.1–15.9)
Immature Grans (Abs): 0 10*3/uL (ref 0.0–0.1)
Immature Granulocytes: 0 %
Lymphocytes Absolute: 1.1 10*3/uL (ref 0.7–3.1)
Lymphs: 28 %
MCH: 32.2 pg (ref 26.6–33.0)
MCHC: 34.4 g/dL (ref 31.5–35.7)
MCV: 94 fL (ref 79–97)
Monocytes Absolute: 0.3 10*3/uL (ref 0.1–0.9)
Monocytes: 8 %
Neutrophils Absolute: 2.2 10*3/uL (ref 1.4–7.0)
Neutrophils: 59 %
Platelets: 213 10*3/uL (ref 150–450)
RBC: 4.63 x10E6/uL (ref 3.77–5.28)
RDW: 12.4 % (ref 11.7–15.4)
WBC: 3.8 10*3/uL (ref 3.4–10.8)

## 2020-11-22 LAB — CMP14+EGFR
ALT: 14 IU/L (ref 0–32)
AST: 12 IU/L (ref 0–40)
Albumin/Globulin Ratio: 1.9 (ref 1.2–2.2)
Albumin: 4.5 g/dL (ref 3.8–4.9)
Alkaline Phosphatase: 88 IU/L (ref 44–121)
BUN/Creatinine Ratio: 11 (ref 9–23)
BUN: 9 mg/dL (ref 6–24)
Bilirubin Total: 0.6 mg/dL (ref 0.0–1.2)
CO2: 25 mmol/L (ref 20–29)
Calcium: 9.9 mg/dL (ref 8.7–10.2)
Chloride: 102 mmol/L (ref 96–106)
Creatinine, Ser: 0.83 mg/dL (ref 0.57–1.00)
Globulin, Total: 2.4 g/dL (ref 1.5–4.5)
Glucose: 184 mg/dL — ABNORMAL HIGH (ref 65–99)
Potassium: 4.4 mmol/L (ref 3.5–5.2)
Sodium: 142 mmol/L (ref 134–144)
Total Protein: 6.9 g/dL (ref 6.0–8.5)
eGFR: 81 mL/min/{1.73_m2} (ref >59)

## 2020-11-22 LAB — LIPID PANEL
Chol/HDL Ratio: 4.3 ratio (ref 0.0–4.4)
Cholesterol, Total: 229 mg/dL — ABNORMAL HIGH (ref 100–199)
HDL: 53 mg/dL (ref >39)
LDL Chol Calc (NIH): 152 mg/dL — ABNORMAL HIGH (ref 0–99)
Triglycerides: 133 mg/dL (ref 0–149)
VLDL Cholesterol Cal: 24 mg/dL (ref 5–40)

## 2020-11-29 ENCOUNTER — Telehealth: Payer: Self-pay | Admitting: Family Medicine

## 2020-11-29 NOTE — Telephone Encounter (Signed)
Natalie Tanner, [place]  11/29/2020  9:44 AM EDT Back to Top    Contacted patient. Provided lab result and providers treatment plan.

## 2020-11-29 NOTE — Telephone Encounter (Signed)
Pt said she has not spoken with anyone about lab results and would like for someone to call her back. She stated that if she does not answer it is okay to leave the results on her voicemail. Please call back.

## 2021-01-05 ENCOUNTER — Encounter: Payer: Self-pay | Admitting: *Deleted

## 2021-02-20 ENCOUNTER — Ambulatory Visit: Admitting: Family Medicine

## 2021-02-21 ENCOUNTER — Ambulatory Visit: Admitting: Family Medicine

## 2021-03-06 ENCOUNTER — Ambulatory Visit: Admitting: Family Medicine

## 2021-03-20 ENCOUNTER — Ambulatory Visit (INDEPENDENT_AMBULATORY_CARE_PROVIDER_SITE_OTHER): Admitting: Family Medicine

## 2021-03-20 ENCOUNTER — Encounter: Payer: Self-pay | Admitting: Family Medicine

## 2021-03-20 VITALS — BP 120/70 | HR 64 | Temp 97.3°F | Ht 63.0 in | Wt 210.4 lb

## 2021-03-20 DIAGNOSIS — I10 Essential (primary) hypertension: Secondary | ICD-10-CM | POA: Diagnosis not present

## 2021-03-20 DIAGNOSIS — E538 Deficiency of other specified B group vitamins: Secondary | ICD-10-CM | POA: Diagnosis not present

## 2021-03-20 DIAGNOSIS — R5383 Other fatigue: Secondary | ICD-10-CM

## 2021-03-20 DIAGNOSIS — E1121 Type 2 diabetes mellitus with diabetic nephropathy: Secondary | ICD-10-CM | POA: Diagnosis not present

## 2021-03-20 DIAGNOSIS — F419 Anxiety disorder, unspecified: Secondary | ICD-10-CM

## 2021-03-20 DIAGNOSIS — D509 Iron deficiency anemia, unspecified: Secondary | ICD-10-CM | POA: Diagnosis not present

## 2021-03-20 DIAGNOSIS — E1165 Type 2 diabetes mellitus with hyperglycemia: Secondary | ICD-10-CM

## 2021-03-20 DIAGNOSIS — E782 Mixed hyperlipidemia: Secondary | ICD-10-CM

## 2021-03-20 LAB — BAYER DCA HB A1C WAIVED: HB A1C (BAYER DCA - WAIVED): 8.6 % — ABNORMAL HIGH (ref 4.8–5.6)

## 2021-03-20 MED ORDER — CYANOCOBALAMIN 1000 MCG/ML IJ SOLN: 1000.0000 ug | INTRAMUSCULAR | 99 refills | Status: AC

## 2021-03-20 MED ORDER — CYANOCOBALAMIN 1000 MCG/ML IJ SOLN
1000.0000 ug | Freq: Once | INTRAMUSCULAR | Status: AC
  Administered 2021-03-20: 1000 ug via INTRAMUSCULAR

## 2021-03-20 MED ORDER — GLIPIZIDE 10 MG PO TABS: 10.0000 mg | ORAL_TABLET | Freq: Every day | ORAL | 3 refills | Status: DC

## 2021-03-20 MED ORDER — ALPRAZOLAM 1 MG PO TABS: 1.0000 mg | ORAL_TABLET | Freq: Two times a day (BID) | ORAL | 1 refills | Status: DC | PRN

## 2021-03-20 MED ORDER — ROSUVASTATIN CALCIUM 10 MG PO TABS: 10.0000 mg | ORAL_TABLET | Freq: Every day | ORAL | 3 refills | Status: DC

## 2021-03-20 NOTE — Progress Notes (Signed)
° °Subjective:  °Patient ID: Natalie Tanner,  °female    DOB: 01/21/1961  Age: 60 y.o.  ° ° °CC: Medical Management of Chronic Issues ° ° °HPI °Natalie Tanner presents for  follow-up of hypertension. Patient has no history of headache chest pain or shortness of breath or recent cough. Patient also denies symptoms of TIA such as numbness weakness lateralizing. Patient denies side effects from medication. States taking it regularly. ° °Patient also  in for follow-up of elevated cholesterol. Doing well without complaints on current medication. Denies side effects  including myalgia and arthralgia and nausea. Also in today for liver function testing. Currently no chest pain, shortness of breath or other cardiovascular related symptoms noted. ° °Follow-up of diabetes. Patient does not check blood sugar at home. Refuses because it hurts too much. Patient denies symptoms such as excessive hunger or urinary frequency, excessive hunger, nausea °No significant hypoglycemic spells noted. °Medications reviewed. Pt reports taking them regularly. Pt. denies complication/adverse reaction today.  ° °GAD 7 : Generalized Anxiety Score 03/20/2021 11/21/2020 08/29/2020 09/28/2019  °Nervous, Anxious, on Edge 1 1 0 1  °Control/stop worrying 0 0 0 0  °Worry too much - different things 0 0 0 0  °Trouble relaxing 1 1 - 1  °Restless 0 0 0 1  °Easily annoyed or irritable 2 1 1 3  °Afraid - awful might happen 1 1 0 1  °Total GAD 7 Score 5 4 - 7  °Anxiety Difficulty Not difficult at all Somewhat difficult Not difficult at all Somewhat difficult  ° ° °Skips xanax if having a good day.  °History °Natalie Tanner has a past medical history of Acute renal failure (ARF) (H[facility]), Anxiety, Coronary atherosclerosis of native coronary artery, Depression, Essential hypertension, GERD (gastroesophageal reflux disease), History of kidney stones, Iron deficiency anemia, Mixed hyperlipidemia, Myocardial infarction ([facility]), Nephrolithiasis, Stroke ([facility]) (2017),  and Type 2 diabetes mellitus ([facility]).  ° °She has a past surgical history that includes Knee surgery; Sinus surgery with Instatrak; Back surgery; and Total hip arthroplasty (Left, 05/07/2019).  ° °Her family history includes Other in her father; Stroke in her father.She reports that she quit smoking about 20 years ago. Her smoking use included cigarettes. She has a 20.00 pack-year smoking history. She has never used smokeless tobacco. She reports that she does not drink alcohol and does not use drugs. ° °Current Outpatient Medications on File Prior to Visit  °Medication Sig Dispense Refill  ° amLODipine (NORVASC) 5 MG tablet Take 1 tablet (5 mg total) by mouth daily. For blood pressure 90 tablet 3  ° aspirin EC 81 MG tablet Take 81 mg by mouth daily. Swallow whole.    ° blood glucose meter kit and supplies KIT Dispense based on patient and insurance preference. Use up to four times daily as directed. For diabetes E11.9, with hyperglycemia. 90 each 11  ° celecoxib (CELEBREX) 200 MG capsule Take 1 capsule (200 mg total) by mouth daily. with food 90 capsule 3  ° empagliflozin (JARDIANCE) 25 MG TABS tablet Take 1 tablet (25 mg total) by mouth daily. 90 tablet 3  ° ferrous sulfate 325 (65 FE) MG tablet Take 1 tablet (325 mg total) by mouth 3 (three) times daily after meals. 90 tablet 1  ° gabapentin (NEURONTIN) 300 MG capsule Take 1 capsule (300 mg total) by mouth 3 (three) times daily. 90 capsule 3  ° meclizine (ANTIVERT) 25 MG tablet Take 1 tablet (25 mg total) by mouth 3 (three) times daily as needed   for dizziness. 90 tablet 11  ° metoprolol tartrate (LOPRESSOR) 25 MG tablet Take 0.5 tablets (12.5 mg total) by mouth 2 (two) times daily. 90 tablet 3  ° nitroGLYCERIN (NITROSTAT) 0.4 MG SL tablet Place 1 tablet (0.4 mg total) under the tongue every 5 (five) minutes as needed for chest pain. 25 tablet 2  ° omeprazole (PRILOSEC OTC) 20 MG tablet Take 1 tablet (20 mg total) by mouth daily. 90 tablet 3  ° PARoxetine (PAXIL) 20  MG tablet Take 1 tablet (20 mg total) by mouth every morning. 90 tablet 3  ° pioglitazone (ACTOS) 30 MG tablet Take 1 tablet (30 mg total) by mouth daily. 90 tablet 3  ° °No current facility-administered medications on file prior to visit.  ° ° °ROS °Review of Systems  °Constitutional: Negative.   °HENT:  Negative for congestion.   °Eyes:  Negative for visual disturbance.  °Respiratory:  Negative for shortness of breath.   °Cardiovascular:  Negative for chest pain.  °Gastrointestinal:  Negative for abdominal pain, constipation, diarrhea, nausea and vomiting.  °Genitourinary:  Negative for difficulty urinating.  °Musculoskeletal:  Negative for arthralgias and myalgias.  °Neurological:  Negative for headaches.  °Psychiatric/Behavioral:  Negative for sleep disturbance.   ° °Objective:  °BP 120/70    Pulse 64    Temp (!) 97.3 °F (36.3 °C)    Ht 5' 3" (1.6 m)    Wt 210 lb 6.4 oz (95.4 kg)    LMP 07/05/2010    SpO2 96%    BMI 37.27 kg/m²  ° °BP Readings from Last 3 Encounters:  °03/20/21 120/70  °11/21/20 134/79  °08/29/20 128/72  ° ° °Wt Readings from Last 3 Encounters:  °03/20/21 210 lb 6.4 oz (95.4 kg)  °11/21/20 214 lb (97.1 kg)  °08/29/20 208 lb 9.6 oz (94.6 kg)  ° ° ° °Physical Exam °Constitutional:   °   General: She is not in acute distress. °   Appearance: She is well-developed.  °Cardiovascular:  °   Rate and Rhythm: Normal rate and regular rhythm.  °Pulmonary:  °   Breath sounds: Normal breath sounds.  °Musculoskeletal:     °   General: Normal range of motion.  °Skin: °   General: Skin is warm and dry.  °Neurological:  °   Mental Status: She is alert and oriented to person, place, and time.  ° °.this ° ° ° °Assessment & Plan:  ° °Natalie Tanner was seen today for medical management of chronic issues. ° °Diagnoses and all orders for this visit: ° °Essential hypertension, benign °-     CBC with Differential/Platelet °-     CMP14+EGFR °-     Vitamin B12 ° °Type 2 diabetes mellitus with diabetic nephropathy, without  long-term current use of insulin (HCC) °-     Bayer DCA Hb A1c Waived °-     Microalbumin / creatinine urine ratio °-     Vitamin B12 ° °Mixed hyperlipidemia °-     Lipid panel °-     Vitamin B12 ° °Anxiety °-     ALPRAZolam (XANAX) 1 MG tablet; Take 1 tablet (1 mg total) by mouth 2 (two) times daily as needed. °-     Vitamin B12 ° °Type 2 diabetes mellitus with hyperglycemia, without long-term current use of insulin (HCC) °-     glipiZIDE (GLUCOTROL) 10 MG tablet; Take 1 tablet (10 mg total) by mouth daily. °-     Vitamin B12 ° °Other orders °-       rosuvastatin (CRESTOR) 10 MG tablet; Take 1 tablet (10 mg total) by mouth daily. -     cyanocobalamin (,VITAMIN B-12,) 1000 MCG/ML injection; Inject 1 mL (1,000 mcg total) into the skin every 30 (thirty) days.   I have discontinued Avigayil R. Gaster's clindamycin. I am also having her start on cyanocobalamin. Additionally, I am having her maintain her meclizine, blood glucose meter kit and supplies, nitroGLYCERIN, ferrous sulfate, aspirin EC, amLODipine, empagliflozin, gabapentin, omeprazole, celecoxib, metoprolol tartrate, PARoxetine, pioglitazone, ALPRAZolam, rosuvastatin, and glipiZIDE.  Meds ordered this encounter  Medications   ALPRAZolam (XANAX) 1 MG tablet    Sig: Take 1 tablet (1 mg total) by mouth 2 (two) times daily as needed.    Dispense:  180 tablet    Refill:  1   rosuvastatin (CRESTOR) 10 MG tablet    Sig: Take 1 tablet (10 mg total) by mouth daily.    Dispense:  90 tablet    Refill:  3   glipiZIDE (GLUCOTROL) 10 MG tablet    Sig: Take 1 tablet (10 mg total) by mouth daily.    Dispense:  90 tablet    Refill:  3   cyanocobalamin (,VITAMIN B-12,) 1000 MCG/ML injection    Sig: Inject 1 mL (1,000 mcg total) into the skin every 30 (thirty) days.    Dispense:  1 mL    Refill:  prn     Follow-up: Return in about 3 months (around 06/18/2021).  Claretta Fraise, M.D.

## 2021-03-20 NOTE — Addendum Note (Signed)
Addended by: Baldomero Lamy B on: 03/20/2021 04:23 PM   Modules accepted: Orders

## 2021-03-21 LAB — CBC WITH DIFFERENTIAL/PLATELET
Basophils Absolute: 0 10*3/uL (ref 0.0–0.2)
Basos: 1 %
EOS (ABSOLUTE): 0.2 10*3/uL (ref 0.0–0.4)
Eos: 4 %
Hematocrit: 44.7 % (ref 34.0–46.6)
Hemoglobin: 15.3 g/dL (ref 11.1–15.9)
Immature Grans (Abs): 0 10*3/uL (ref 0.0–0.1)
Immature Granulocytes: 0 %
Lymphocytes Absolute: 1.3 10*3/uL (ref 0.7–3.1)
Lymphs: 30 %
MCH: 31.6 pg (ref 26.6–33.0)
MCHC: 34.2 g/dL (ref 31.5–35.7)
MCV: 92 fL (ref 79–97)
Monocytes Absolute: 0.3 10*3/uL (ref 0.1–0.9)
Monocytes: 6 %
Neutrophils Absolute: 2.5 10*3/uL (ref 1.4–7.0)
Neutrophils: 59 %
Platelets: 199 10*3/uL (ref 150–450)
RBC: 4.84 x10E6/uL (ref 3.77–5.28)
RDW: 12.2 % (ref 11.7–15.4)
WBC: 4.2 10*3/uL (ref 3.4–10.8)

## 2021-03-21 LAB — CMP14+EGFR
ALT: 11 IU/L (ref 0–32)
AST: 16 IU/L (ref 0–40)
Albumin/Globulin Ratio: 1.7 (ref 1.2–2.2)
Albumin: 4.3 g/dL (ref 3.8–4.9)
Alkaline Phosphatase: 73 IU/L (ref 44–121)
BUN/Creatinine Ratio: 17 (ref 9–23)
BUN: 14 mg/dL (ref 6–24)
Bilirubin Total: 0.6 mg/dL (ref 0.0–1.2)
CO2: 20 mmol/L (ref 20–29)
Calcium: 9.3 mg/dL (ref 8.7–10.2)
Chloride: 106 mmol/L (ref 96–106)
Creatinine, Ser: 0.81 mg/dL (ref 0.57–1.00)
Globulin, Total: 2.5 g/dL (ref 1.5–4.5)
Glucose: 170 mg/dL — ABNORMAL HIGH (ref 70–99)
Potassium: 3.7 mmol/L (ref 3.5–5.2)
Sodium: 143 mmol/L (ref 134–144)
Total Protein: 6.8 g/dL (ref 6.0–8.5)
eGFR: 84 mL/min/{1.73_m2} (ref >59)

## 2021-03-21 LAB — LIPID PANEL
Chol/HDL Ratio: 5.5 ratio — ABNORMAL HIGH (ref 0.0–4.4)
Cholesterol, Total: 214 mg/dL — ABNORMAL HIGH (ref 100–199)
HDL: 39 mg/dL — ABNORMAL LOW (ref >39)
LDL Chol Calc (NIH): 144 mg/dL — ABNORMAL HIGH (ref 0–99)
Triglycerides: 169 mg/dL — ABNORMAL HIGH (ref 0–149)
VLDL Cholesterol Cal: 31 mg/dL (ref 5–40)

## 2021-03-21 LAB — MICROALBUMIN / CREATININE URINE RATIO
Creatinine, Urine: 78.8 mg/dL
Microalb/Creat Ratio: 16 mg/g creat (ref 0–29)
Microalbumin, Urine: 12.8 ug/mL

## 2021-03-22 LAB — SPECIMEN STATUS REPORT

## 2021-03-22 LAB — VITAMIN B12: Vitamin B-12: 159 pg/mL — ABNORMAL LOW (ref 232–1245)

## 2021-03-26 MED ORDER — ROSUVASTATIN CALCIUM 10 MG PO TABS
10.0000 mg | ORAL_TABLET | Freq: Every day | ORAL | 3 refills | Status: DC
Start: 2021-03-26 — End: 2022-04-01

## 2021-03-26 NOTE — Addendum Note (Signed)
Addended byCarrolyn Leigh on: 03/26/2021 02:21 PM   Modules accepted: Orders

## 2021-03-28 ENCOUNTER — Other Ambulatory Visit: Payer: Self-pay | Admitting: *Deleted

## 2021-03-28 DIAGNOSIS — E538 Deficiency of other specified B group vitamins: Secondary | ICD-10-CM

## 2021-03-28 MED ORDER — CYANOCOBALAMIN 1000 MCG/ML IJ SOLN
1000.0000 ug | INTRAMUSCULAR | Status: AC
  Administered 2021-04-03 – 2021-04-10 (×2): 1000 ug via INTRAMUSCULAR

## 2021-03-28 MED ORDER — CYANOCOBALAMIN 1000 MCG/ML IJ SOLN
1000.0000 ug | INTRAMUSCULAR | Status: AC
  Administered 2021-05-08 – 2021-07-17 (×3): 1000 ug via INTRAMUSCULAR

## 2021-04-03 ENCOUNTER — Ambulatory Visit (INDEPENDENT_AMBULATORY_CARE_PROVIDER_SITE_OTHER): Admitting: *Deleted

## 2021-04-03 DIAGNOSIS — E538 Deficiency of other specified B group vitamins: Secondary | ICD-10-CM

## 2021-04-03 NOTE — Progress Notes (Signed)
Pt given B12 injection IM left deltoid and tolerated well. °

## 2021-04-10 ENCOUNTER — Ambulatory Visit (INDEPENDENT_AMBULATORY_CARE_PROVIDER_SITE_OTHER)

## 2021-04-10 DIAGNOSIS — E538 Deficiency of other specified B group vitamins: Secondary | ICD-10-CM

## 2021-04-10 NOTE — Progress Notes (Signed)
Cyanocobalamin injection given to right deltoid.  Patient tolerated well. 

## 2021-05-08 ENCOUNTER — Ambulatory Visit (INDEPENDENT_AMBULATORY_CARE_PROVIDER_SITE_OTHER)

## 2021-05-08 DIAGNOSIS — E538 Deficiency of other specified B group vitamins: Secondary | ICD-10-CM

## 2021-05-08 NOTE — Progress Notes (Signed)
B12 injection given to patient and tolerated well.  

## 2021-06-12 ENCOUNTER — Ambulatory Visit (INDEPENDENT_AMBULATORY_CARE_PROVIDER_SITE_OTHER): Admitting: Emergency Medicine

## 2021-06-12 DIAGNOSIS — E538 Deficiency of other specified B group vitamins: Secondary | ICD-10-CM | POA: Diagnosis not present

## 2021-06-18 ENCOUNTER — Encounter: Payer: Self-pay | Admitting: Family Medicine

## 2021-06-18 ENCOUNTER — Ambulatory Visit (INDEPENDENT_AMBULATORY_CARE_PROVIDER_SITE_OTHER): Admitting: Family Medicine

## 2021-06-18 VITALS — BP 108/68 | HR 69 | Temp 97.0°F | Ht 63.0 in | Wt 211.4 lb

## 2021-06-18 DIAGNOSIS — M5432 Sciatica, left side: Secondary | ICD-10-CM

## 2021-06-18 DIAGNOSIS — I10 Essential (primary) hypertension: Secondary | ICD-10-CM

## 2021-06-18 DIAGNOSIS — E782 Mixed hyperlipidemia: Secondary | ICD-10-CM | POA: Diagnosis not present

## 2021-06-18 DIAGNOSIS — E1121 Type 2 diabetes mellitus with diabetic nephropathy: Secondary | ICD-10-CM | POA: Diagnosis not present

## 2021-06-18 LAB — BAYER DCA HB A1C WAIVED: HB A1C (BAYER DCA - WAIVED): 6.8 % — ABNORMAL HIGH (ref 4.8–5.6)

## 2021-06-18 MED ORDER — OZEMPIC (0.25 OR 0.5 MG/DOSE) 2 MG/3ML ~~LOC~~ SOPN
PEN_INJECTOR | SUBCUTANEOUS | 1 refills | Status: DC
Start: 2021-06-18 — End: 2021-10-09

## 2021-06-18 MED ORDER — AMLODIPINE BESYLATE 5 MG PO TABS: 5.0000 mg | ORAL_TABLET | Freq: Every day | ORAL | 3 refills | Status: DC

## 2021-06-18 MED ORDER — GABAPENTIN 300 MG PO CAPS: 600.0000 mg | ORAL_CAPSULE | Freq: Two times a day (BID) | ORAL | 3 refills | Status: DC

## 2021-06-18 NOTE — Progress Notes (Signed)
? ?Subjective:  ?Patient ID: Natalie Tanner,  ?female    DOB: [birth year]  Age: 61 y.o.  ? ? ?CC: Medical Management of Chronic Issues ? ? ?HPI ?Natalie Tanner presents for  follow-up of hypertension. Patient has no history of headache chest pain or shortness of breath or recent cough. Patient also denies symptoms of TIA such as numbness weakness lateralizing. Patient denies side effects from medication. States taking it regularly. ? ?Patient also  in for follow-up of elevated cholesterol. Doing well without complaints on current medication. Denies side effects  including myalgia and arthralgia and nausea. Also in today for liver function testing. Currently no chest pain, shortness of breath or other cardiovascular related symptoms noted. ? ?Follow-up of diabetes. Patient does check blood sugar at home. Readings run between 96 and 146 ?Patient denies symptoms such as excessive hunger or urinary frequency, excessive hunger, nausea ?No significant hypoglycemic spells noted. ?Medications reviewed. Pt reports taking them regularly. Pt. denies complication/adverse reaction today.  ? ? ?History ?Natalie Tanner has a past medical history of Acute renal failure (ARF) (Natalie Tanner), Anxiety, Coronary atherosclerosis of native coronary artery, Depression, Essential hypertension, GERD (gastroesophageal reflux disease), History of kidney stones, Iron deficiency anemia, Mixed hyperlipidemia, Myocardial infarction Natalie Tanner), Nephrolithiasis, Stroke (Natalie Tanner) (2017), and Type 2 diabetes mellitus (Natalie Tanner).  ? ?She has a past surgical history that includes Knee surgery; Sinus surgery with Instatrak; Back surgery; and Total hip arthroplasty (Left, 05/07/2019).  ? ?Her family history includes Other in her father; Stroke in her father.She reports that she quit smoking about 20 years ago. Her smoking use included cigarettes. She has a 20.00 pack-year smoking history. She has never used smokeless tobacco. She reports that she does not drink alcohol and  does not use drugs. ? ?Current Outpatient Medications on File Prior to Visit  ?Medication Sig Dispense Refill  ? ALPRAZolam (XANAX) 1 MG tablet Take 1 tablet (1 mg total) by mouth 2 (two) times daily as needed. 180 tablet 1  ? aspirin EC 81 MG tablet Take 81 mg by mouth daily. Swallow whole.    ? blood glucose meter kit and supplies KIT Dispense based on patient and insurance preference. Use up to four times daily as directed. For diabetes E11.9, with hyperglycemia. 90 each 11  ? celecoxib (CELEBREX) 200 MG capsule Take 1 capsule (200 mg total) by mouth daily. with food 90 capsule 3  ? cyanocobalamin (,VITAMIN B-12,) 1000 MCG/ML injection Inject 1 mL (1,000 mcg total) into the skin every 30 (thirty) days. 1 mL prn  ? ferrous sulfate 325 (65 FE) MG tablet Take 1 tablet (325 mg total) by mouth 3 (three) times daily after meals. 90 tablet 1  ? glipiZIDE (GLUCOTROL) 10 MG tablet Take 1 tablet (10 mg total) by mouth daily. 90 tablet 3  ? meclizine (ANTIVERT) 25 MG tablet Take 1 tablet (25 mg total) by mouth 3 (three) times daily as needed for dizziness. 90 tablet 11  ? metoprolol tartrate (LOPRESSOR) 25 MG tablet Take 0.5 tablets (12.5 mg total) by mouth 2 (two) times daily. 90 tablet 3  ? nitroGLYCERIN (NITROSTAT) 0.4 MG SL tablet Place 1 tablet (0.4 mg total) under the tongue every 5 (five) minutes as needed for chest pain. 25 tablet 2  ? omeprazole (PRILOSEC OTC) 20 MG tablet Take 1 tablet (20 mg total) by mouth daily. 90 tablet 3  ? PARoxetine (PAXIL) 20 MG tablet Take 1 tablet (20 mg total) by mouth every morning. 90 tablet 3  ? pioglitazone (ACTOS)  30 MG tablet Take 1 tablet (30 mg total) by mouth daily. 90 tablet 3  ? rosuvastatin (CRESTOR) 10 MG tablet Take 1 tablet (10 mg total) by mouth daily. 90 tablet 3  ? ?Current Facility-Administered Medications on File Prior to Visit  ?Medication Dose Route Frequency Provider Last Rate Last Admin  ? cyanocobalamin ((VITAMIN B-12)) injection 1,000 mcg  1,000 mcg  Intramuscular Q30 days Claretta Fraise, MD   1,000 mcg at 06/12/21 1424  ? ? ?ROS ?Review of Systems  ?Constitutional: Negative.   ?HENT: Negative.    ?Eyes:  Negative for visual disturbance.  ?Respiratory:  Negative for shortness of breath.   ?Cardiovascular:  Negative for chest pain.  ?Gastrointestinal:  Negative for abdominal pain.  ?Musculoskeletal:  Positive for back pain (left side sciartica worsening). Negative for arthralgias.  ? ?Objective:  ?BP 108/68   Pulse 69   Temp (!) 97 ?F (36.1 ?C)   Ht 5' 3"  (1.6 m)   Wt 211 lb 6.4 oz (95.9 kg)   LMP 07/05/2010   SpO2 96%   BMI 37.45 kg/m?  ? ?BP Readings from Last 3 Encounters:  ?06/18/21 108/68  ?03/20/21 120/70  ?11/21/20 134/79  ? ? ?Wt Readings from Last 3 Encounters:  ?06/18/21 211 lb 6.4 oz (95.9 kg)  ?03/20/21 210 lb 6.4 oz (95.4 kg)  ?11/21/20 214 lb (97.1 kg)  ? ? ? ?Physical Exam ?Constitutional:   ?   General: She is not in acute distress. ?   Appearance: She is well-developed.  ?Cardiovascular:  ?   Rate and Rhythm: Normal rate and regular rhythm.  ?Pulmonary:  ?   Breath sounds: Normal breath sounds.  ?Musculoskeletal:     ?   General: Normal range of motion.  ?Skin: ?   General: Skin is warm and dry.  ?Neurological:  ?   Mental Status: She is alert and oriented to person, place, and time.  ? ? ?Diabetic Foot Exam - Simple   ?Simple Foot Form ?Diabetic Foot exam was performed with the following findings: Yes 06/18/2021  4:14 PM  ?Visual Inspection ?No deformities, no ulcerations, no other skin breakdown bilaterally: Yes ?Sensation Testing ?Intact to touch and monofilament testing bilaterally: Yes ?Pulse Check ?Posterior Tibialis and Dorsalis pulse intact bilaterally: Yes ?Comments ?  ? ? ? ? ?Assessment & Plan:  ? ?Aitanna was seen today for medical management of chronic issues. ? ?Diagnoses and all orders for this visit: ? ?Type 2 diabetes mellitus with diabetic nephropathy, without long-term current use of insulin (Natalie Tanner) ?-     Bayer DCA Hb A1c  Waived ? ?Mixed hyperlipidemia ?-     Lipid panel ? ?Essential hypertension, benign ?-     CBC with Differential/Platelet ?-     CMP14+EGFR ? ?Left sciatic nerve pain ?-     gabapentin (NEURONTIN) 300 MG capsule; Take 2 capsules (600 mg total) by mouth 2 (two) times daily. ? ?Other orders ?-     amLODipine (NORVASC) 5 MG tablet; Take 1 tablet (5 mg total) by mouth daily. For blood pressure ?-     Semaglutide,0.25 or 0.5MG/DOS, (OZEMPIC, 0.25 OR 0.5 MG/DOSE,) 2 MG/3ML SOPN; Inject 0.25 mg into the skin once a week for 14 days, THEN 0.5 mg once a week. ? ? ?I have discontinued Mckensi R. Bugaj's empagliflozin. I have also changed her gabapentin. Additionally, I am having her start on Ozempic (0.25 or 0.5 MG/DOSE). Lastly, I am having her maintain her meclizine, blood glucose meter kit and supplies, nitroGLYCERIN,  ferrous sulfate, aspirin EC, omeprazole, celecoxib, metoprolol tartrate, PARoxetine, pioglitazone, ALPRAZolam, glipiZIDE, cyanocobalamin, rosuvastatin, and amLODipine. We will continue to administer cyanocobalamin. ? ?Meds ordered this encounter  ?Medications  ? amLODipine (NORVASC) 5 MG tablet  ?  Sig: Take 1 tablet (5 mg total) by mouth daily. For blood pressure  ?  Dispense:  90 tablet  ?  Refill:  3  ? gabapentin (NEURONTIN) 300 MG capsule  ?  Sig: Take 2 capsules (600 mg total) by mouth 2 (two) times daily.  ?  Dispense:  120 capsule  ?  Refill:  3  ? Semaglutide,0.25 or 0.5MG/DOS, (OZEMPIC, 0.25 OR 0.5 MG/DOSE,) 2 MG/3ML SOPN  ?  Sig: Inject 0.25 mg into the skin once a week for 14 days, THEN 0.5 mg once a week.  ?  Dispense:  3 mL  ?  Refill:  1  ? ? ? ?Follow-up: No follow-ups on file. ? ?Claretta Fraise, M.D. ?

## 2021-06-19 LAB — CBC WITH DIFFERENTIAL/PLATELET
Basophils Absolute: 0 10*3/uL (ref 0.0–0.2)
Basos: 1 %
EOS (ABSOLUTE): 0.2 10*3/uL (ref 0.0–0.4)
Eos: 5 %
Hematocrit: 45.2 % (ref 34.0–46.6)
Hemoglobin: 15.4 g/dL (ref 11.1–15.9)
Immature Grans (Abs): 0 10*3/uL (ref 0.0–0.1)
Immature Granulocytes: 0 %
Lymphocytes Absolute: 1.3 10*3/uL (ref 0.7–3.1)
Lymphs: 27 %
MCH: 31.2 pg (ref 26.6–33.0)
MCHC: 34.1 g/dL (ref 31.5–35.7)
MCV: 92 fL (ref 79–97)
Monocytes Absolute: 0.2 10*3/uL (ref 0.1–0.9)
Monocytes: 5 %
Neutrophils Absolute: 3 10*3/uL (ref 1.4–7.0)
Neutrophils: 62 %
Platelets: 194 10*3/uL (ref 150–450)
RBC: 4.94 x10E6/uL (ref 3.77–5.28)
RDW: 13.9 % (ref 11.7–15.4)
WBC: 4.8 10*3/uL (ref 3.4–10.8)

## 2021-06-19 LAB — CMP14+EGFR
ALT: 14 IU/L (ref 0–32)
AST: 17 IU/L (ref 0–40)
Albumin/Globulin Ratio: 1.8 (ref 1.2–2.2)
Albumin: 4.2 g/dL (ref 3.8–4.9)
Alkaline Phosphatase: 65 IU/L (ref 44–121)
BUN/Creatinine Ratio: 16 (ref 12–28)
BUN: 12 mg/dL (ref 8–27)
Bilirubin Total: 1.1 mg/dL (ref 0.0–1.2)
CO2: 23 mmol/L (ref 20–29)
Calcium: 9.2 mg/dL (ref 8.7–10.3)
Chloride: 104 mmol/L (ref 96–106)
Creatinine, Ser: 0.75 mg/dL (ref 0.57–1.00)
Globulin, Total: 2.4 g/dL (ref 1.5–4.5)
Glucose: 118 mg/dL — ABNORMAL HIGH (ref 70–99)
Potassium: 3.9 mmol/L (ref 3.5–5.2)
Sodium: 141 mmol/L (ref 134–144)
Total Protein: 6.6 g/dL (ref 6.0–8.5)
eGFR: 91 mL/min/{1.73_m2} (ref >59)

## 2021-06-19 LAB — LIPID PANEL
Chol/HDL Ratio: 2.4 ratio (ref 0.0–4.4)
Cholesterol, Total: 122 mg/dL (ref 100–199)
HDL: 51 mg/dL (ref >39)
LDL Chol Calc (NIH): 55 mg/dL (ref 0–99)
Triglycerides: 81 mg/dL (ref 0–149)
VLDL Cholesterol Cal: 16 mg/dL (ref 5–40)

## 2021-06-19 NOTE — Progress Notes (Signed)
Hello Natalie Tanner,  Your lab result is normal and/or stable.Some minor variations that are not significant are commonly marked abnormal, but do not represent any medical problem for you.  Best regards, Ishaan Villamar, M.D.

## 2021-07-17 ENCOUNTER — Ambulatory Visit (INDEPENDENT_AMBULATORY_CARE_PROVIDER_SITE_OTHER): Admitting: Family Medicine

## 2021-07-17 ENCOUNTER — Encounter: Payer: Self-pay | Admitting: Family Medicine

## 2021-07-17 ENCOUNTER — Ambulatory Visit (INDEPENDENT_AMBULATORY_CARE_PROVIDER_SITE_OTHER)

## 2021-07-17 DIAGNOSIS — R109 Unspecified abdominal pain: Secondary | ICD-10-CM | POA: Diagnosis not present

## 2021-07-17 DIAGNOSIS — E538 Deficiency of other specified B group vitamins: Secondary | ICD-10-CM | POA: Diagnosis not present

## 2021-07-17 DIAGNOSIS — N3000 Acute cystitis without hematuria: Secondary | ICD-10-CM | POA: Diagnosis not present

## 2021-07-17 LAB — URINALYSIS, ROUTINE W REFLEX MICROSCOPIC
Bilirubin, UA: NEGATIVE
Glucose, UA: NEGATIVE
Nitrite, UA: NEGATIVE
RBC, UA: NEGATIVE
Specific Gravity, UA: 1.015 (ref 1.005–1.030)
Urobilinogen, Ur: >8 mg/dL — ABNORMAL HIGH (ref 0.2–1.0)
pH, UA: 6.5 (ref 5.0–7.5)

## 2021-07-17 LAB — MICROSCOPIC EXAMINATION: RBC, Urine: NONE SEEN /hpf (ref 0–2)

## 2021-07-17 MED ORDER — CEFDINIR 300 MG PO CAPS: 300.0000 mg | ORAL_CAPSULE | Freq: Two times a day (BID) | ORAL | 0 refills | Status: AC

## 2021-07-17 NOTE — Progress Notes (Signed)
? ?Virtual Visit via Telephone Note ? ?I connected with Natalie Tanner on 07/17/21 at 2:29 PM by telephone and verified that I am speaking with the correct person using two identifiers. Natalie Tanner is currently located in her vehicle and nobody is currently with her during this visit. The provider, Loman Brooklyn, FNP is located in their home at time of visit. ? ?I discussed the limitations, risks, security and privacy concerns of performing an evaluation and management service by telephone and the availability of in person appointments. I also discussed with the patient that there may be a patient responsible charge related to this service. The patient expressed understanding and agreed to proceed. ? ?Subjective: ?PCP: Claretta Fraise, MD ? ?Chief Complaint  ?Patient presents with  ? Urinary Tract Infection  ? ?Patient complains of left flank pain and incomplete bladder emptying. She has had symptoms for 1 day. Patient denies fever. Patient does not have a history of recurrent UTI.  Patient does not have a history of pyelonephritis.  ? ? ?ROS: Per HPI ? ?Current Outpatient Medications:  ?  ALPRAZolam (XANAX) 1 MG tablet, Take 1 tablet (1 mg total) by mouth 2 (two) times daily as needed., Disp: 180 tablet, Rfl: 1 ?  amLODipine (NORVASC) 5 MG tablet, Take 1 tablet (5 mg total) by mouth daily. For blood pressure, Disp: 90 tablet, Rfl: 3 ?  aspirin EC 81 MG tablet, Take 81 mg by mouth daily. Swallow whole., Disp: , Rfl:  ?  blood glucose meter kit and supplies KIT, Dispense based on patient and insurance preference. Use up to four times daily as directed. For diabetes E11.9, with hyperglycemia., Disp: 90 each, Rfl: 11 ?  celecoxib (CELEBREX) 200 MG capsule, Take 1 capsule (200 mg total) by mouth daily. with food, Disp: 90 capsule, Rfl: 3 ?  cyanocobalamin (,VITAMIN B-12,) 1000 MCG/ML injection, Inject 1 mL (1,000 mcg total) into the skin every 30 (thirty) days., Disp: 1 mL, Rfl: prn ?  ferrous sulfate  325 (65 FE) MG tablet, Take 1 tablet (325 mg total) by mouth 3 (three) times daily after meals., Disp: 90 tablet, Rfl: 1 ?  gabapentin (NEURONTIN) 300 MG capsule, Take 2 capsules (600 mg total) by mouth 2 (two) times daily., Disp: 120 capsule, Rfl: 3 ?  glipiZIDE (GLUCOTROL) 10 MG tablet, Take 1 tablet (10 mg total) by mouth daily., Disp: 90 tablet, Rfl: 3 ?  meclizine (ANTIVERT) 25 MG tablet, Take 1 tablet (25 mg total) by mouth 3 (three) times daily as needed for dizziness., Disp: 90 tablet, Rfl: 11 ?  metoprolol tartrate (LOPRESSOR) 25 MG tablet, Take 0.5 tablets (12.5 mg total) by mouth 2 (two) times daily., Disp: 90 tablet, Rfl: 3 ?  nitroGLYCERIN (NITROSTAT) 0.4 MG SL tablet, Place 1 tablet (0.4 mg total) under the tongue every 5 (five) minutes as needed for chest pain., Disp: 25 tablet, Rfl: 2 ?  omeprazole (PRILOSEC OTC) 20 MG tablet, Take 1 tablet (20 mg total) by mouth daily., Disp: 90 tablet, Rfl: 3 ?  PARoxetine (PAXIL) 20 MG tablet, Take 1 tablet (20 mg total) by mouth every morning., Disp: 90 tablet, Rfl: 3 ?  pioglitazone (ACTOS) 30 MG tablet, Take 1 tablet (30 mg total) by mouth daily., Disp: 90 tablet, Rfl: 3 ?  rosuvastatin (CRESTOR) 10 MG tablet, Take 1 tablet (10 mg total) by mouth daily., Disp: 90 tablet, Rfl: 3 ?  Semaglutide,0.25 or 0.5MG/DOS, (OZEMPIC, 0.25 OR 0.5 MG/DOSE,) 2 MG/3ML SOPN, Inject 0.25 mg into  the skin once a week for 14 days, THEN 0.5 mg once a week., Disp: 3 mL, Rfl: 1 ? ?Current Facility-Administered Medications:  ?  cyanocobalamin ((VITAMIN B-12)) injection 1,000 mcg, 1,000 mcg, Intramuscular, Q30 days, Claretta Fraise, MD, 1,000 mcg at 07/17/21 1419 ? ?Allergies  ?Allergen Reactions  ? Metformin And Related Diarrhea  ? ?Past Medical History:  ?Diagnosis Date  ? Acute renal failure (ARF) (HCC)   ? June 2021 - taken off ACE inhibitor and Celebrex  ? Anxiety   ? Coronary atherosclerosis of native coronary artery   ? Managed medically following cardiac catheterization 5/12  ?  Depression   ? Essential hypertension   ? GERD (gastroesophageal reflux disease)   ? History of kidney stones   ? Iron deficiency anemia   ? Secondary to menometrorrhagia  ? Mixed hyperlipidemia   ? Myocardial infarction [facility])   ? NSTEMI 5/12  ? Nephrolithiasis   ? Stroke [facility]) 2017  ? Type 2 diabetes mellitus ([place])   ? ? ?Observations/Objective: ?A&O  ?No respiratory distress or wheezing audible over the phone ?Mood, judgement, and thought processes all WNL ? ?Assessment and Plan: ?1. Acute cystitis without hematuria ?- cefdinir (OMNICEF) 300 MG capsule; Take 1 capsule (300 mg total) by mouth 2 (two) times daily for 7 days.  Dispense: 14 capsule; Refill: 0 ? ?2. Left flank pain ?- Urine Culture ?- Urinalysis, Routine w reflex microscopic - Urine dipstick shows positive for protein, positive for leukocytes, positive for urobilinogen, and positive for ketones.  Micro exam: moderate bacteria. ? ? ?Follow Up Instructions: ? ?I discussed the assessment and treatment plan with the patient. The patient was provided an opportunity to ask questions and all were answered. The patient agreed with the plan and demonstrated an understanding of the instructions. ?  ?The patient was advised to call back or seek an in-person evaluation if the symptoms worsen or if the condition fails to improve as anticipated. ? ?The above assessment and management plan was discussed with the patient. The patient verbalized understanding of and has agreed to the management plan. Patient is aware to call the clinic if symptoms persist or worsen. Patient is aware when to return to the clinic for a follow-up visit. Patient educated on when it is appropriate to go to the emergency department.  ? ?Time call ended: 2:40 PM ? ?I provided 11 minutes of non-face-to-face time during this encounter. ? ?Hendricks Limes, MSN, APRN, FNP-C ?[place] ?07/17/21 ? ? ?

## 2021-07-17 NOTE — Progress Notes (Signed)
B12 injection given to patient and tolerated well.  

## 2021-07-19 LAB — URINE CULTURE: Organism ID, Bacteria: NO GROWTH

## 2021-08-15 ENCOUNTER — Encounter: Payer: Self-pay | Admitting: Family Medicine

## 2021-08-15 ENCOUNTER — Ambulatory Visit (INDEPENDENT_AMBULATORY_CARE_PROVIDER_SITE_OTHER): Admitting: Family Medicine

## 2021-08-15 VITALS — BP 107/68 | HR 68 | Temp 97.0°F | Ht 63.0 in | Wt 202.0 lb

## 2021-08-15 DIAGNOSIS — R112 Nausea with vomiting, unspecified: Secondary | ICD-10-CM

## 2021-08-15 DIAGNOSIS — R42 Dizziness and giddiness: Secondary | ICD-10-CM

## 2021-08-15 MED ORDER — MECLIZINE HCL 50 MG PO TABS: 50.0000 mg | ORAL_TABLET | Freq: Three times a day (TID) | ORAL | 0 refills | Status: DC | PRN

## 2021-08-15 MED ORDER — PROMETHAZINE HCL 50 MG/ML IJ SOLN: 50.0000 mg | Freq: Once | INTRAMUSCULAR | Status: DC

## 2021-08-15 MED ORDER — PROMETHAZINE HCL 25 MG/ML IJ SOLN
25.0000 mg | Freq: Once | INTRAMUSCULAR | Status: AC
  Administered 2021-08-15: 25 mg via INTRAMUSCULAR

## 2021-08-15 MED ORDER — PROMETHAZINE HCL 25 MG PO TABS: 25.0000 mg | ORAL_TABLET | Freq: Once | ORAL | Status: DC

## 2021-08-15 NOTE — Addendum Note (Signed)
Addended by: Baldomero Lamy B on: 08/15/2021 05:01 PM   Modules accepted: Orders

## 2021-08-15 NOTE — Progress Notes (Signed)
Subjective:  Patient ID: Natalie Tanner, female    DOB: 1961-02-24  Age: 61 y.o. MRN: [id number]  CC: Dizziness, Nausea, and Emesis   HPI Natalie Tanner presents for onset yesterday of nausea, vomiting and dizziness. Yesterday felt like floor and walls were moving and couldn't walk without falling over. ENT in pasr told her she had Labyrinthitis.Has had tinnitus. Some heRING LOST WITH PAST EPISODES no hearing loss currently. Better if lays still with eyes closed. Had double vision yesterday.     08/15/2021    2:21 PM 06/18/2021    3:45 PM 06/18/2021    3:41 PM  Depression screen PHQ 2/9  Decreased Interest 0 2 0  Down, Depressed, Hopeless 0 0 0  PHQ - 2 Score 0 2 0  Altered sleeping  3   Tired, decreased energy  1   Change in appetite  2   Feeling bad or failure about yourself   0   Trouble concentrating  2   Moving slowly or fidgety/restless  0   Suicidal thoughts  0   PHQ-9 Score  10   Difficult doing work/chores  Somewhat difficult     History Natalie Tanner has a past medical history of Acute renal failure (ARF) ([place]), Anxiety, Coronary atherosclerosis of native coronary artery, Depression, Essential hypertension, GERD (gastroesophageal reflux disease), History of kidney stones, Iron deficiency anemia, Mixed hyperlipidemia, Myocardial infarction ([place]), Nephrolithiasis, Stroke ([place]) (2017), and Type 2 diabetes mellitus ([place]).   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   Her family history includes Other in her father; Stroke in her father.She reports that she quit smoking about 20 years ago. Her smoking use included cigarettes. She has a 20.00 pack-year smoking history. She has never used smokeless tobacco. She reports that she does not drink alcohol and does not use drugs.    ROS Review of Systems  Constitutional: Negative.   HENT:  Positive for tinnitus.    Eyes:  Negative for visual disturbance.  Respiratory:  Negative for shortness of breath.   Cardiovascular:  Negative for chest pain.  Gastrointestinal:  Positive for nausea and vomiting. Negative for abdominal pain and diarrhea.  Neurological:  Positive for dizziness.    Objective:  BP 107/68   Pulse 68   Temp (!) 97 F (36.1 C)   Ht _0  (1.6 m)   Wt 202 lb (91.6 kg)   LMP 07/05/2010   SpO2 95%   BMI 35.78 kg/m   BP Readings from Last 3 Encounters:  08/15/21 107/68  06/18/21 108/68  03/20/21 120/70    Wt Readings from Last 3 Encounters:  08/15/21 202 lb (91.6 kg)  06/18/21 211 lb 6.4 oz (95.9 kg)  03/20/21 210 lb 6.4 oz (95.4 kg)     Physical Exam Constitutional:      General: She is not in acute distress.    Appearance: She is well-developed. She is ill-appearing.  Cardiovascular:     Rate and Rhythm: Normal rate and regular rhythm.  Pulmonary:     Breath sounds: Normal breath sounds.  Musculoskeletal:        General: Normal range of motion.  Skin:    General: Skin is warm and dry.  Neurological:     General: No focal deficit present.     Mental Status: She is alert and oriented to person, place, and time.     Cranial Nerves: No cranial nerve  deficit.     Motor: No weakness.     Coordination: Coordination abnormal (off balance).       Assessment & Plan:   [name] was seen today for dizziness, nausea and emesis.  Diagnoses and all orders for this visit:  Vertigo -     Cancel: Ambulatory referral to Physical Therapy -     Ambulatory referral to Physical Therapy -     promethazine (PHENERGAN) injection 50 mg  Nausea and vomiting, unspecified vomiting type  Other orders -     meclizine (ANTIVERT) 50 MG tablet; Take 1 tablet (50 mg total) by mouth 3 (three) times daily as needed.       I have discontinued Cree R. Stabenow's meclizine. I am also having her start on meclizine. Additionally, I am having her maintain her blood glucose meter kit  and supplies, nitroGLYCERIN, ferrous sulfate, aspirin EC, omeprazole, celecoxib, metoprolol tartrate, PARoxetine, pioglitazone, ALPRAZolam, glipiZIDE, cyanocobalamin, rosuvastatin, amLODipine, gabapentin, and Ozempic (0.25 or 0.5 MG/DOSE). We will continue to administer cyanocobalamin and promethazine.  Allergies as of 08/15/2021       Reactions   Metformin And Related Diarrhea        Medication List        Accurate as of August 15, 2021  4:22 PM. If you have any questions, ask your nurse or doctor.          ALPRAZolam 1 MG tablet Commonly known as: XANAX Take 1 tablet (1 mg total) by mouth 2 (two) times daily as needed.   amLODipine 5 MG tablet Commonly known as: NORVASC Take 1 tablet (5 mg total) by mouth daily. For blood pressure   aspirin EC 81 MG tablet Take 81 mg by mouth daily. Swallow whole.   blood glucose meter kit and supplies Kit Dispense based on patient and insurance preference. Use up to four times daily as directed. For diabetes E11.9, with hyperglycemia.   celecoxib 200 MG capsule Commonly known as: CeleBREX Take 1 capsule (200 mg total) by mouth daily. with food   cyanocobalamin 1000 MCG/ML injection Commonly known as: (VITAMIN B-12) Inject 1 mL (1,000 mcg total) into the skin every 30 (thirty) days.   ferrous sulfate 325 (65 FE) MG tablet Take 1 tablet (325 mg total) by mouth 3 (three) times daily after meals.   gabapentin 300 MG capsule Commonly known as: NEURONTIN Take 2 capsules (600 mg total) by mouth 2 (two) times daily.   glipiZIDE 10 MG tablet Commonly known as: GLUCOTROL Take 1 tablet (10 mg total) by mouth daily.   meclizine 50 MG tablet Commonly known as: ANTIVERT Take 1 tablet (50 mg total) by mouth 3 (three) times daily as needed. What changed:  medication strength how much to take reasons to take this Changed by: Claretta Fraise, MD   metoprolol tartrate 25 MG tablet Commonly known as: LOPRESSOR Take 0.5 tablets (12.5 mg  total) by mouth 2 (two) times daily.   nitroGLYCERIN 0.4 MG SL tablet Commonly known as: Nitrostat Place 1 tablet (0.4 mg total) under the tongue every 5 (five) minutes as needed for chest pain.   omeprazole 20 MG tablet Commonly known as: PRILOSEC OTC Take 1 tablet (20 mg total) by mouth daily.   Ozempic (0.25 or 0.5 MG/DOSE) 2 MG/3ML Sopn Generic drug: Semaglutide(0.25 or 0.5MG/DOS) Inject 0.25 mg into the skin once a week for 14 days, THEN 0.5 mg once a week. Start taking on: June 18, 2021   PARoxetine 20 MG tablet Commonly known as:  PAXIL Take 1 tablet (20 mg total) by mouth every morning.   pioglitazone 30 MG tablet Commonly known as: ACTOS Take 1 tablet (30 mg total) by mouth daily.   rosuvastatin 10 MG tablet Commonly known as: CRESTOR Take 1 tablet (10 mg total) by mouth daily.         Follow-up: Return if symptoms worsen or fail to improve.  Claretta Fraise, M.D.

## 2021-08-17 ENCOUNTER — Telehealth: Payer: Self-pay | Admitting: Family Medicine

## 2021-08-17 NOTE — Telephone Encounter (Signed)
Patient was seen on 6/14 because she was dizzy, it has not gotten any better and she needs a note for work. Husband requested for her to be written out from 6/14-6/18. If she is still not feeling better she will come back in to be seen. Please call when letter is ready.

## 2021-08-17 NOTE — Telephone Encounter (Signed)
Note provided husband aware

## 2021-09-13 ENCOUNTER — Ambulatory Visit (HOSPITAL_COMMUNITY)

## 2021-09-24 ENCOUNTER — Ambulatory Visit (INDEPENDENT_AMBULATORY_CARE_PROVIDER_SITE_OTHER): Admitting: Family Medicine

## 2021-09-24 ENCOUNTER — Encounter: Payer: Self-pay | Admitting: Family Medicine

## 2021-09-24 VITALS — BP 112/74 | HR 65 | Temp 98.0°F | Ht 63.0 in | Wt 210.0 lb

## 2021-09-24 DIAGNOSIS — Z79899 Other long term (current) drug therapy: Secondary | ICD-10-CM

## 2021-09-24 DIAGNOSIS — F419 Anxiety disorder, unspecified: Secondary | ICD-10-CM | POA: Diagnosis not present

## 2021-09-24 DIAGNOSIS — E1165 Type 2 diabetes mellitus with hyperglycemia: Secondary | ICD-10-CM

## 2021-09-24 DIAGNOSIS — E782 Mixed hyperlipidemia: Secondary | ICD-10-CM

## 2021-09-24 DIAGNOSIS — I10 Essential (primary) hypertension: Secondary | ICD-10-CM

## 2021-09-24 LAB — BAYER DCA HB A1C WAIVED: HB A1C (BAYER DCA - WAIVED): 5.4 % (ref 4.8–5.6)

## 2021-09-24 MED ORDER — OMEPRAZOLE MAGNESIUM 20 MG PO TBEC
20.0000 mg | DELAYED_RELEASE_TABLET | Freq: Every day | ORAL | 3 refills | Status: DC
Start: 2021-09-24 — End: 2021-12-25

## 2021-09-24 MED ORDER — ALPRAZOLAM 1 MG PO TABS: 1.0000 mg | ORAL_TABLET | Freq: Two times a day (BID) | ORAL | 1 refills | Status: DC | PRN

## 2021-09-24 NOTE — Progress Notes (Signed)
Subjective:  Patient ID: Natalie Tanner,  female    DOB: 08-16-1960  Age: 61 y.o.    CC: Medical Management of Chronic Issues   HPI Natalie Tanner presents for  follow-up of hypertension. Patient has no history of headache chest pain or shortness of breath or recent cough. Patient also denies symptoms of TIA such as numbness weakness lateralizing. Patient denies side effects from medication. States taking it regularly.  Patient also  in for follow-up of elevated cholesterol. Doing well without complaints on current medication. Denies side effects  including myalgia and arthralgia and nausea. Also in today for liver function testing. Currently no chest pain, shortness of breath or other cardiovascular related symptoms noted.  Follow-up of diabetes. Patient does check blood sugar at home. Readings run between 71 and 130-140 Patient denies symptoms such as excessive hunger or urinary frequency, excessive hunger, nausea One significant hypoglycemic spells noted. Medications reviewed. Pt reports taking them regularly. Pt. denies complication/adverse reaction today.    History Natalie Tanner has a past medical history of Acute renal failure (ARF) ([place]), Anxiety, Coronary atherosclerosis of native coronary artery, Depression, Essential hypertension, GERD (gastroesophageal reflux disease), History of kidney stones, Iron deficiency anemia, Mixed hyperlipidemia, Myocardial infarction [facility]), Nephrolithiasis, Stroke ([place]) (2017), and Type 2 diabetes mellitus ([place]).   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   Her family history includes Other in her father; Stroke in her father.She reports that she quit smoking about 20 years ago. Her smoking use included cigarettes. She has a 20.00 pack-year smoking history. She has never used smokeless tobacco. She reports that she does not drink alcohol  and does not use drugs.  Current Outpatient Medications on File Prior to Visit  Medication Sig Dispense Refill   amLODipine (NORVASC) 5 MG tablet Take 1 tablet (5 mg total) by mouth daily. For blood pressure 90 tablet 3   aspirin EC 81 MG tablet Take 81 mg by mouth daily. Swallow whole.     blood glucose meter kit and supplies KIT Dispense based on patient and insurance preference. Use up to four times daily as directed. For diabetes E11.9, with hyperglycemia. 90 each 11   celecoxib (CELEBREX) 200 MG capsule Take 1 capsule (200 mg total) by mouth daily. with food 90 capsule 3   cyanocobalamin (,VITAMIN B-12,) 1000 MCG/ML injection Inject 1 mL (1,000 mcg total) into the skin every 30 (thirty) days. 1 mL prn   ferrous sulfate 325 (65 FE) MG tablet Take 1 tablet (325 mg total) by mouth 3 (three) times daily after meals. 90 tablet 1   gabapentin (NEURONTIN) 300 MG capsule Take 2 capsules (600 mg total) by mouth 2 (two) times daily. 120 capsule 3   glipiZIDE (GLUCOTROL) 10 MG tablet Take 1 tablet (10 mg total) by mouth daily. 90 tablet 3   meclizine (ANTIVERT) 50 MG tablet Take 1 tablet (50 mg total) by mouth 3 (three) times daily as needed. 30 tablet 0   metoprolol tartrate (LOPRESSOR) 25 MG tablet Take 0.5 tablets (12.5 mg total) by mouth 2 (two) times daily. 90 tablet 3   nitroGLYCERIN (NITROSTAT) 0.4 MG SL tablet Place 1 tablet (0.4 mg total) under the tongue every 5 (five) minutes as needed for chest pain. 25 tablet 2   PARoxetine (PAXIL) 20 MG tablet Take 1 tablet (20 mg total) by mouth every morning. 90 tablet 3   pioglitazone (ACTOS) 30 MG  tablet Take 1 tablet (30 mg total) by mouth daily. 90 tablet 3   rosuvastatin (CRESTOR) 10 MG tablet Take 1 tablet (10 mg total) by mouth daily. 90 tablet 3   Current Facility-Administered Medications on File Prior to Visit  Medication Dose Route Frequency Provider Last Rate Last Admin   cyanocobalamin ((VITAMIN B-12)) injection 1,000 mcg  1,000 mcg  Intramuscular Q30 days Claretta Fraise, MD   1,000 mcg at 07/17/21 1419    ROS Review of Systems  Constitutional: Negative.   HENT: Negative.    Eyes:  Negative for visual disturbance.       Cataracts OU  Respiratory:  Negative for shortness of breath.   Cardiovascular:  Negative for chest pain.  Gastrointestinal:  Negative for abdominal pain.  Musculoskeletal:  Negative for arthralgias.    Objective:  BP 112/74   Pulse 65   Temp 98 F (36.7 C)   Ht 5' 3"  (1.6 m)   Wt 210 lb (95.3 kg)   LMP 07/05/2010   SpO2 97%   BMI 37.20 kg/m   BP Readings from Last 3 Encounters:  09/24/21 112/74  08/15/21 107/68  06/18/21 108/68    Wt Readings from Last 3 Encounters:  09/24/21 210 lb (95.3 kg)  08/15/21 202 lb (91.6 kg)  06/18/21 211 lb 6.4 oz (95.9 kg)     Physical Exam Constitutional:      General: She is not in acute distress.    Appearance: She is well-developed.  Cardiovascular:     Rate and Rhythm: Normal rate and regular rhythm.  Pulmonary:     Breath sounds: Normal breath sounds.  Musculoskeletal:        General: Normal range of motion.  Skin:    General: Skin is warm and dry.  Neurological:     Mental Status: She is alert and oriented to person, place, and time.     A1c=5.4   Lab Results  Component Value Date   HGBA1C 6.8 (H) 06/18/2021   HGBA1C 8.6 (H) 03/20/2021   HGBA1C 6.8 (H) 11/21/2020    Assessment & Plan:   Simranjit was seen today for medical management of chronic issues.  Diagnoses and all orders for this visit:  Mixed hyperlipidemia -     Lipid panel  Type 2 diabetes mellitus with hyperglycemia, without long-term current use of insulin (HCC) -     Bayer DCA Hb A1c Waived  Essential hypertension, benign -     CBC with Differential/Platelet -     CMP14+EGFR  Anxiety -     ALPRAZolam (XANAX) 1 MG tablet; Take 1 tablet (1 mg total) by mouth 2 (two) times daily as needed.  Controlled substance agreement signed -     [employer]  13 (MW), Urine  Other orders -     omeprazole (PRILOSEC OTC) 20 MG tablet; Take 1 tablet (20 mg total) by mouth daily.   I am having Jaid R. Fader maintain her blood glucose meter kit and supplies, nitroGLYCERIN, ferrous sulfate, aspirin EC, celecoxib, metoprolol tartrate, PARoxetine, pioglitazone, glipiZIDE, cyanocobalamin, rosuvastatin, amLODipine, gabapentin, meclizine, ALPRAZolam, and omeprazole. We will continue to administer cyanocobalamin.  Meds ordered this encounter  Medications   ALPRAZolam (XANAX) 1 MG tablet    Sig: Take 1 tablet (1 mg total) by mouth 2 (two) times daily as needed.    Dispense:  180 tablet    Refill:  1   omeprazole (PRILOSEC OTC) 20 MG tablet    Sig: Take 1 tablet (20 mg total)  by mouth daily.    Dispense:  90 tablet    Refill:  3     Follow-up: Return in about 3 months (around 12/25/2021).  Claretta Fraise, M.D.

## 2021-09-25 LAB — CMP14+EGFR
ALT: 15 IU/L (ref 0–32)
AST: 17 IU/L (ref 0–40)
Albumin/Globulin Ratio: 1.5 (ref 1.2–2.2)
Albumin: 4.2 g/dL (ref 3.8–4.9)
Alkaline Phosphatase: 68 IU/L (ref 44–121)
BUN/Creatinine Ratio: 18 (ref 12–28)
BUN: 17 mg/dL (ref 8–27)
Bilirubin Total: 1.3 mg/dL — ABNORMAL HIGH (ref 0.0–1.2)
CO2: 25 mmol/L (ref 20–29)
Calcium: 9.3 mg/dL (ref 8.7–10.3)
Chloride: 101 mmol/L (ref 96–106)
Creatinine, Ser: 0.92 mg/dL (ref 0.57–1.00)
Globulin, Total: 2.8 g/dL (ref 1.5–4.5)
Glucose: 149 mg/dL — ABNORMAL HIGH (ref 70–99)
Potassium: 4.4 mmol/L (ref 3.5–5.2)
Sodium: 141 mmol/L (ref 134–144)
Total Protein: 7 g/dL (ref 6.0–8.5)
eGFR: 71 mL/min/{1.73_m2} (ref >59)

## 2021-09-25 LAB — CBC WITH DIFFERENTIAL/PLATELET
Basophils Absolute: 0 10*3/uL (ref 0.0–0.2)
Basos: 1 %
EOS (ABSOLUTE): 0.2 10*3/uL (ref 0.0–0.4)
Eos: 4 %
Hematocrit: 45.2 % (ref 34.0–46.6)
Hemoglobin: 15.5 g/dL (ref 11.1–15.9)
Immature Grans (Abs): 0 10*3/uL (ref 0.0–0.1)
Immature Granulocytes: 0 %
Lymphocytes Absolute: 1.4 10*3/uL (ref 0.7–3.1)
Lymphs: 25 %
MCH: 33.8 pg — ABNORMAL HIGH (ref 26.6–33.0)
MCHC: 34.3 g/dL (ref 31.5–35.7)
MCV: 99 fL — ABNORMAL HIGH (ref 79–97)
Monocytes Absolute: 0.3 10*3/uL (ref 0.1–0.9)
Monocytes: 5 %
Neutrophils Absolute: 3.5 10*3/uL (ref 1.4–7.0)
Neutrophils: 65 %
Platelets: 203 10*3/uL (ref 150–450)
RBC: 4.59 x10E6/uL (ref 3.77–5.28)
RDW: 13.1 % (ref 11.7–15.4)
WBC: 5.4 10*3/uL (ref 3.4–10.8)

## 2021-09-25 LAB — LIPID PANEL
Chol/HDL Ratio: 2.9 ratio (ref 0.0–4.4)
Cholesterol, Total: 168 mg/dL (ref 100–199)
HDL: 57 mg/dL (ref >39)
LDL Chol Calc (NIH): 88 mg/dL (ref 0–99)
Triglycerides: 131 mg/dL (ref 0–149)
VLDL Cholesterol Cal: 23 mg/dL (ref 5–40)

## 2021-09-25 NOTE — Progress Notes (Signed)
Hello Lynia,  Your lab result is normal and/or stable.Some minor variations that are not significant are commonly marked abnormal, but do not represent any medical problem for you.  Best regards, Jerelle Virden, M.D.

## 2021-09-27 LAB — TOXASSURE SELECT 13 (MW), URINE

## 2021-10-09 ENCOUNTER — Telehealth: Payer: Self-pay | Admitting: Family Medicine

## 2021-10-09 ENCOUNTER — Other Ambulatory Visit: Payer: Self-pay | Admitting: *Deleted

## 2021-10-09 ENCOUNTER — Other Ambulatory Visit: Payer: Self-pay | Admitting: Family Medicine

## 2021-10-09 MED ORDER — OZEMPIC (0.25 OR 0.5 MG/DOSE) 2 MG/3ML ~~LOC~~ SOPN: 0.5000 mg | PEN_INJECTOR | SUBCUTANEOUS | 1 refills | Status: DC

## 2021-10-09 NOTE — Telephone Encounter (Signed)
I sent in the xanax and have a confirmation receipt from them. I have the same for the prilosec. The others were supposed to have refills available. Which ones does she need sent in? (Specify names of meds please.)

## 2021-10-09 NOTE — Telephone Encounter (Signed)
Pt called to let Dr Livia Snellen know that she just got off the phone with [facility] who is telling her that they never received her Rxs from Dr Livia Snellen when she was seen at her last visit in July.

## 2021-10-09 NOTE — Telephone Encounter (Signed)
OZEMPIC  

## 2021-10-09 NOTE — Telephone Encounter (Signed)
Ozempic refill sent in.

## 2021-12-13 ENCOUNTER — Other Ambulatory Visit: Payer: Self-pay | Admitting: Family Medicine

## 2021-12-13 DIAGNOSIS — E1165 Type 2 diabetes mellitus with hyperglycemia: Secondary | ICD-10-CM

## 2021-12-25 ENCOUNTER — Ambulatory Visit (INDEPENDENT_AMBULATORY_CARE_PROVIDER_SITE_OTHER): Admitting: Family Medicine

## 2021-12-25 ENCOUNTER — Encounter: Payer: Self-pay | Admitting: Family Medicine

## 2021-12-25 VITALS — BP 111/73 | HR 80 | Temp 98.4°F | Ht 63.0 in | Wt 208.8 lb

## 2021-12-25 DIAGNOSIS — E1165 Type 2 diabetes mellitus with hyperglycemia: Secondary | ICD-10-CM | POA: Diagnosis not present

## 2021-12-25 DIAGNOSIS — I10 Essential (primary) hypertension: Secondary | ICD-10-CM

## 2021-12-25 DIAGNOSIS — E782 Mixed hyperlipidemia: Secondary | ICD-10-CM

## 2021-12-25 DIAGNOSIS — M159 Polyosteoarthritis, unspecified: Secondary | ICD-10-CM | POA: Diagnosis not present

## 2021-12-25 LAB — BAYER DCA HB A1C WAIVED: HB A1C (BAYER DCA - WAIVED): 6 % — ABNORMAL HIGH (ref 4.8–5.6)

## 2021-12-25 MED ORDER — METOPROLOL TARTRATE 25 MG PO TABS: 12.5000 mg | ORAL_TABLET | Freq: Two times a day (BID) | ORAL | 3 refills | Status: DC

## 2021-12-25 MED ORDER — PIOGLITAZONE HCL 30 MG PO TABS: 30.0000 mg | ORAL_TABLET | Freq: Every day | ORAL | 2 refills | Status: DC

## 2021-12-25 MED ORDER — CELECOXIB 200 MG PO CAPS: 200.0000 mg | ORAL_CAPSULE | Freq: Every day | ORAL | 3 refills | Status: DC

## 2021-12-25 MED ORDER — PAROXETINE HCL 20 MG PO TABS: 20.0000 mg | ORAL_TABLET | Freq: Every morning | ORAL | 2 refills | Status: DC

## 2021-12-25 MED ORDER — OMEPRAZOLE MAGNESIUM 20 MG PO TBEC: 20.0000 mg | DELAYED_RELEASE_TABLET | Freq: Every day | ORAL | 3 refills | Status: DC

## 2021-12-25 NOTE — Patient Instructions (Signed)
Insomnia Insomnia is a sleep disorder that makes it difficult to fall asleep or stay asleep. Insomnia can cause fatigue, low energy, difficulty concentrating, mood swings, and poor performance at work or school. There are three different ways to classify insomnia: Difficulty falling asleep. Difficulty staying asleep. Waking up too early in the morning. Any type of insomnia can be long-term (chronic) or short-term (acute). Both are common. Short-term insomnia usually lasts for 3 months or less. Chronic insomnia occurs at least three times a week for longer than 3 months. What are the causes? Insomnia may be caused by another condition, situation, or substance, such as: Having certain mental health conditions, such as anxiety and depression. Using caffeine, alcohol, tobacco, or drugs. Having gastrointestinal conditions, such as gastroesophageal reflux disease (GERD). Having certain medical conditions. These include: Asthma. Alzheimer's disease. Stroke. Chronic pain. An overactive thyroid gland (hyperthyroidism). Other sleep disorders, such as restless legs syndrome and sleep apnea. Menopause. Sometimes, the cause of insomnia may not be known. What increases the risk? Risk factors for insomnia include: Gender. Females are affected more often than males. Age. Insomnia is more common as people get older. Stress and certain medical and mental health conditions. Lack of exercise. Having an irregular work schedule. This may include working night shifts and traveling between different time zones. What are the signs or symptoms? If you have insomnia, the main symptom is having trouble falling asleep or having trouble staying asleep. This may lead to other symptoms, such as: Feeling tired or having low energy. Feeling nervous about going to sleep. Not feeling rested in the morning. Having trouble concentrating. Feeling irritable, anxious, or depressed. How is this diagnosed? This condition  may be diagnosed based on: Your symptoms and medical history. Your health care provider may ask about: Your sleep habits. Any medical conditions you have. Your mental health. A physical exam. How is this treated? Treatment for insomnia depends on the cause. Treatment may focus on treating an underlying condition that is causing the insomnia. Treatment may also include: Medicines to help you sleep. Counseling or therapy. Lifestyle adjustments to help you sleep better. Follow these instructions at home: Eating and drinking  Limit or avoid alcohol, caffeinated beverages, and products that contain nicotine and tobacco, especially close to bedtime. These can disrupt your sleep. Do not eat a large meal or eat spicy foods right before bedtime. This can lead to digestive discomfort that can make it hard for you to sleep. Sleep habits  Keep a sleep diary to help you and your health care provider figure out what could be causing your insomnia. Write down: When you sleep. When you wake up during the night. How well you sleep and how rested you feel the next day. Any side effects of medicines you are taking. What you eat and drink. Make your bedroom a dark, comfortable place where it is easy to fall asleep. Put up shades or blackout curtains to block light from outside. Use a white noise machine to block noise. Keep the temperature cool. Limit screen use before bedtime. This includes: Not watching TV. Not using your smartphone, tablet, or computer. Stick to a routine that includes going to bed and waking up at the same times every day and night. This can help you fall asleep faster. Consider making a quiet activity, such as reading, part of your nighttime routine. Try to avoid taking naps during the day so that you sleep better at night. Get out of bed if you are still awake after   15 minutes of trying to sleep. Keep the lights down, but try reading or doing a quiet activity. When you feel  sleepy, go back to bed. General instructions Take over-the-counter and prescription medicines only as told by your health care provider. Exercise regularly as told by your health care provider. However, avoid exercising in the hours right before bedtime. Use relaxation techniques to manage stress. Ask your health care provider to suggest some techniques that may work well for you. These may include: Breathing exercises. Routines to release muscle tension. Visualizing peaceful scenes. Make sure that you drive carefully. Do not drive if you feel very sleepy. Keep all follow-up visits. This is important. Contact a health care provider if: You are tired throughout the day. You have trouble in your daily routine due to sleepiness. You continue to have sleep problems, or your sleep problems get worse. Get help right away if: You have thoughts about hurting yourself or someone else. Get help right away if you feel like you may hurt yourself or others, or have thoughts about taking your own life. Go to your nearest emergency room or: Call 911. Call the National Suicide Prevention Lifeline at 1-800-273-8255 or 988. This is open 24 hours a day. Text the Crisis Text Line at 741741. Summary Insomnia is a sleep disorder that makes it difficult to fall asleep or stay asleep. Insomnia can be long-term (chronic) or short-term (acute). Treatment for insomnia depends on the cause. Treatment may focus on treating an underlying condition that is causing the insomnia. Keep a sleep diary to help you and your health care provider figure out what could be causing your insomnia. This information is not intended to replace advice given to you by your health care provider. Make sure you discuss any questions you have with your health care provider. Document Revised: 01/29/2021 Document Reviewed: 01/29/2021 Elsevier Patient Education  2023 Elsevier Inc.  

## 2021-12-25 NOTE — Progress Notes (Signed)
`  Subjective:  Patient ID: Natalie Tanner, female    DOB: 08/02/1960  Age: 61 y.o. MRN: [id number]  CC: Medical Management of Chronic Issues   HPI Natalie Tanner presents for presents for Follow-up of diabetes. Patient checks blood sugar at home.   120 fasting and 140 fasting & prandial Patient denies symptoms such as polyuria, polydipsia, excessive hunger, nausea No significant hypoglycemic spells noted. Medications reviewed. Pt reports taking them regularly without complication/adverse reaction being reported today.  Lab Results  Component Value Date   HGBA1C 6.0 (H) 12/25/2021   HGBA1C 5.4 09/24/2021   HGBA1C 6.8 (H) 06/18/2021         12/25/2021    3:40 PM 12/25/2021    3:24 PM 09/24/2021    3:55 PM  Depression screen PHQ 2/9  Decreased Interest 2 0 1  Down, Depressed, Hopeless 2 0 1  PHQ - 2 Score 4 0 2  Altered sleeping 3  2  Tired, decreased energy 2  2  Change in appetite 3  2  Feeling bad or failure about yourself  0  1  Trouble concentrating 0  1  Moving slowly or fidgety/restless 0  1  Suicidal thoughts 0  0  PHQ-9 Score 12  11  Difficult doing work/chores Somewhat difficult  Somewhat difficult    History Christabelle has a past medical history of Acute renal failure (ARF) ([place]), Anxiety, Coronary atherosclerosis of native coronary artery, Depression, Essential hypertension, GERD (gastroesophageal reflux disease), History of kidney stones, Iron deficiency anemia, Mixed hyperlipidemia, Myocardial infarction ([place]), Nephrolithiasis, Stroke ([place]) (2017), and Type 2 diabetes mellitus ([place]).      Her family history includes Other in her father; Stroke in her father.She reports that she quit smoking about 20 years ago. Her smoking use included cigarettes. She has a 20.00 pack-year smoking history. She has never used smokeless tobacco. She reports that she does not drink alcohol and does not use drugs.    ROS Review of Systems  Constitutional: Negative.    HENT:  Negative for congestion.   Eyes:  Negative for visual disturbance.  Respiratory:  Negative for shortness of breath.   Cardiovascular:  Negative for chest pain.  Gastrointestinal:  Negative for abdominal pain, constipation, diarrhea, nausea and vomiting.  Genitourinary:  Negative for difficulty urinating.  Musculoskeletal:  Negative for arthralgias and myalgias.  Neurological:  Negative for headaches.  Psychiatric/Behavioral:  Positive for sleep disturbance (sleeps from 5 to 7. Gets kids out to school. Sleeps then 8 AM to 1 pm.).     Objective:  BP 111/73   Pulse 80   Temp 98.4 F (36.9 C)   Ht 5' 3"  (1.6 m)   Wt 208 lb 12.8 oz (94.7 kg)   LMP 07/05/2010   SpO2 95%   BMI 36.99 kg/m   BP Readings from Last 3 Encounters:  12/25/21 111/73  09/24/21 112/74  08/15/21 107/68    Wt Readings from Last 3 Encounters:  12/25/21 208 lb 12.8 oz (94.7 kg)  09/24/21 210 lb (95.3 kg)  08/15/21 202 lb (91.6 kg)     Physical Exam Constitutional:      General: She is not in acute distress.    Appearance: She is well-developed.  Cardiovascular:     Rate and Rhythm: Normal rate and regular rhythm.  Pulmonary:     Breath sounds: Normal breath sounds.  Musculoskeletal:        General: Normal range of motion.  Skin:    General: Skin  is warm and dry.  Neurological:     Mental Status: She is alert and oriented to person, place, and time.       Assessment & Plan:   Natalie Tanner was seen today for medical management of chronic issues.  Diagnoses and all orders for this visit:  Mixed hyperlipidemia -     Lipid panel  Type 2 diabetes mellitus with hyperglycemia, without long-term current use of insulin (HCC) -     Bayer DCA Hb A1c Waived -     pioglitazone (ACTOS) 30 MG tablet; Take 1 tablet (30 mg total) by mouth daily.  Essential hypertension, benign -     CBC with Differential/Platelet -     CMP14+EGFR -     metoprolol tartrate (LOPRESSOR) 25 MG tablet; Take 0.5 tablets  (12.5 mg total) by mouth 2 (two) times daily.  Primary osteoarthritis involving multiple joints -     celecoxib (CELEBREX) 200 MG capsule; Take 1 capsule (200 mg total) by mouth daily. with food  Other orders -     omeprazole (PRILOSEC OTC) 20 MG tablet; Take 1 tablet (20 mg total) by mouth daily. -     PARoxetine (PAXIL) 20 MG tablet; Take 1 tablet (20 mg total) by mouth every morning.       I have changed Natalie Tanner PARoxetine and pioglitazone. I am also having her maintain her blood glucose meter kit and supplies, nitroGLYCERIN, ferrous sulfate, aspirin EC, glipiZIDE, cyanocobalamin, rosuvastatin, amLODipine, gabapentin, meclizine, ALPRAZolam, Ozempic (0.25 or 0.5 MG/DOSE), celecoxib, metoprolol tartrate, and omeprazole. We will continue to administer cyanocobalamin.  Allergies as of 12/25/2021       Reactions   Metformin And Related Diarrhea        Medication List        Accurate as of December 25, 2021  5:48 PM. If you have any questions, ask your nurse or doctor.          ALPRAZolam 1 MG tablet Commonly known as: XANAX Take 1 tablet (1 mg total) by mouth 2 (two) times daily as needed.   amLODipine 5 MG tablet Commonly known as: NORVASC Take 1 tablet (5 mg total) by mouth daily. For blood pressure   aspirin EC 81 MG tablet Take 81 mg by mouth daily. Swallow whole.   blood glucose meter kit and supplies Kit Dispense based on patient and insurance preference. Use up to four times daily as directed. For diabetes E11.9, with hyperglycemia.   celecoxib 200 MG capsule Commonly known as: CeleBREX Take 1 capsule (200 mg total) by mouth daily. with food   cyanocobalamin 1000 MCG/ML injection Commonly known as: VITAMIN B12 Inject 1 mL (1,000 mcg total) into the skin every 30 (thirty) days.   ferrous sulfate 325 (65 FE) MG tablet Take 1 tablet (325 mg total) by mouth 3 (three) times daily after meals.   gabapentin 300 MG capsule Commonly known as:  NEURONTIN Take 2 capsules (600 mg total) by mouth 2 (two) times daily.   glipiZIDE 10 MG tablet Commonly known as: GLUCOTROL Take 1 tablet (10 mg total) by mouth daily.   meclizine 50 MG tablet Commonly known as: ANTIVERT Take 1 tablet (50 mg total) by mouth 3 (three) times daily as needed.   metoprolol tartrate 25 MG tablet Commonly known as: LOPRESSOR Take 0.5 tablets (12.5 mg total) by mouth 2 (two) times daily.   nitroGLYCERIN 0.4 MG SL tablet Commonly known as: Nitrostat Place 1 tablet (0.4 mg total) under the tongue every  5 (five) minutes as needed for chest pain.   omeprazole 20 MG tablet Commonly known as: PRILOSEC OTC Take 1 tablet (20 mg total) by mouth daily.   Ozempic (0.25 or 0.5 MG/DOSE) 2 MG/3ML Sopn Generic drug: Semaglutide(0.25 or 0.5MG/DOS) Inject 0.5 mg into the skin once a week.   PARoxetine 20 MG tablet Commonly known as: PAXIL Take 1 tablet (20 mg total) by mouth every morning. What changed: additional instructions Changed by: Claretta Fraise, MD   pioglitazone 30 MG tablet Commonly known as: ACTOS Take 1 tablet (30 mg total) by mouth daily. What changed: additional instructions Changed by: Claretta Fraise, MD   rosuvastatin 10 MG tablet Commonly known as: CRESTOR Take 1 tablet (10 mg total) by mouth daily.       Reviewed sleep hygiene & handout given.  Follow-up: Return in about 3 months (around 03/27/2022).  Claretta Fraise, M.D.

## 2021-12-26 ENCOUNTER — Ambulatory Visit

## 2021-12-26 LAB — CMP14+EGFR
ALT: 13 IU/L (ref 0–32)
AST: 15 IU/L (ref 0–40)
Albumin/Globulin Ratio: 1.9 (ref 1.2–2.2)
Albumin: 4.1 g/dL (ref 3.8–4.9)
Alkaline Phosphatase: 63 IU/L (ref 44–121)
BUN/Creatinine Ratio: 22 (ref 12–28)
BUN: 18 mg/dL (ref 8–27)
Bilirubin Total: 1.1 mg/dL (ref 0.0–1.2)
CO2: 23 mmol/L (ref 20–29)
Calcium: 9.1 mg/dL (ref 8.7–10.3)
Chloride: 103 mmol/L (ref 96–106)
Creatinine, Ser: 0.83 mg/dL (ref 0.57–1.00)
Globulin, Total: 2.2 g/dL (ref 1.5–4.5)
Glucose: 124 mg/dL — ABNORMAL HIGH (ref 70–99)
Potassium: 3.5 mmol/L (ref 3.5–5.2)
Sodium: 139 mmol/L (ref 134–144)
Total Protein: 6.3 g/dL (ref 6.0–8.5)
eGFR: 81 mL/min/1.73

## 2021-12-26 LAB — LIPID PANEL
Chol/HDL Ratio: 2.5 ratio (ref 0.0–4.4)
Cholesterol, Total: 124 mg/dL (ref 100–199)
HDL: 50 mg/dL (ref >39)
LDL Chol Calc (NIH): 58 mg/dL (ref 0–99)
Triglycerides: 80 mg/dL (ref 0–149)
VLDL Cholesterol Cal: 16 mg/dL (ref 5–40)

## 2021-12-26 LAB — CBC WITH DIFFERENTIAL/PLATELET
Basophils Absolute: 0 x10E3/uL (ref 0.0–0.2)
Basos: 1 %
EOS (ABSOLUTE): 0.2 x10E3/uL (ref 0.0–0.4)
Eos: 4 %
Hematocrit: 44.6 % (ref 34.0–46.6)
Hemoglobin: 15.6 g/dL (ref 11.1–15.9)
Immature Grans (Abs): 0 x10E3/uL (ref 0.0–0.1)
Immature Granulocytes: 0 %
Lymphocytes Absolute: 1.3 x10E3/uL (ref 0.7–3.1)
Lymphs: 24 %
MCH: 33.5 pg — ABNORMAL HIGH (ref 26.6–33.0)
MCHC: 35 g/dL (ref 31.5–35.7)
MCV: 96 fL (ref 79–97)
Monocytes Absolute: 0.3 x10E3/uL (ref 0.1–0.9)
Monocytes: 5 %
Neutrophils Absolute: 3.4 x10E3/uL (ref 1.4–7.0)
Neutrophils: 66 %
Platelets: 170 x10E3/uL (ref 150–450)
RBC: 4.66 x10E6/uL (ref 3.77–5.28)
RDW: 11.5 % — ABNORMAL LOW (ref 11.7–15.4)
WBC: 5.2 x10E3/uL (ref 3.4–10.8)

## 2021-12-26 NOTE — Progress Notes (Signed)
Hello Asees,  Your lab result is normal and/or stable.Some minor variations that are not significant are commonly marked abnormal, but do not represent any medical problem for you.  Best regards, Lyrick Lagrand, M.D.

## 2022-01-22 ENCOUNTER — Other Ambulatory Visit (HOSPITAL_COMMUNITY): Payer: Self-pay

## 2022-03-13 ENCOUNTER — Telehealth: Payer: Self-pay | Admitting: Family Medicine

## 2022-03-13 ENCOUNTER — Other Ambulatory Visit: Payer: Self-pay | Admitting: Family Medicine

## 2022-03-13 MED ORDER — OZEMPIC (0.25 OR 0.5 MG/DOSE) 2 MG/3ML ~~LOC~~ SOPN: 0.5000 mg | PEN_INJECTOR | SUBCUTANEOUS | 1 refills | Status: DC

## 2022-03-13 NOTE — Telephone Encounter (Signed)
Please let the patient know that I sent their prescription to their pharmacy. Thanks, WS 

## 2022-03-13 NOTE — Telephone Encounter (Signed)
  Prescription Request  03/13/2022  Is this a "Controlled Substance" medicine?   Have you seen your PCP in the last 2 weeks? Appt 1/29   If YES, route message to pool  -  If NO, patient needs to be scheduled for appointment.  What is the name of the medication or equipment? Ozempic   Have you contacted your pharmacy to request a refill? yes   Which pharmacy would you like this sent to?  [facility] - [place], [address]    *Make sure to send in for diabetes per pr   Patient notified that their request is being sent to the clinical staff for review and that they should receive a response within 2 business days.

## 2022-03-13 NOTE — Telephone Encounter (Signed)
Last office visit 12/25/21 where medication is listed.  Do not see medication on current med list.

## 2022-04-01 ENCOUNTER — Ambulatory Visit (INDEPENDENT_AMBULATORY_CARE_PROVIDER_SITE_OTHER): Admitting: Family Medicine

## 2022-04-01 ENCOUNTER — Encounter: Payer: Self-pay | Admitting: Family Medicine

## 2022-04-01 VITALS — BP 128/79 | HR 81 | Temp 98.1°F | Ht 63.0 in | Wt 219.0 lb

## 2022-04-01 DIAGNOSIS — F419 Anxiety disorder, unspecified: Secondary | ICD-10-CM | POA: Diagnosis not present

## 2022-04-01 DIAGNOSIS — E1165 Type 2 diabetes mellitus with hyperglycemia: Secondary | ICD-10-CM | POA: Diagnosis not present

## 2022-04-01 DIAGNOSIS — I10 Essential (primary) hypertension: Secondary | ICD-10-CM | POA: Diagnosis not present

## 2022-04-01 DIAGNOSIS — E782 Mixed hyperlipidemia: Secondary | ICD-10-CM

## 2022-04-01 DIAGNOSIS — M5432 Sciatica, left side: Secondary | ICD-10-CM

## 2022-04-01 LAB — BAYER DCA HB A1C WAIVED: HB A1C (BAYER DCA - WAIVED): 5.6 % (ref 4.8–5.6)

## 2022-04-01 MED ORDER — PAROXETINE HCL 20 MG PO TABS: 20.0000 mg | ORAL_TABLET | Freq: Every morning | ORAL | 2 refills | Status: DC

## 2022-04-01 MED ORDER — ALPRAZOLAM 1 MG PO TABS: 1.0000 mg | ORAL_TABLET | Freq: Two times a day (BID) | ORAL | 1 refills | Status: DC | PRN

## 2022-04-01 MED ORDER — OZEMPIC (0.25 OR 0.5 MG/DOSE) 2 MG/3ML ~~LOC~~ SOPN: 0.5000 mg | PEN_INJECTOR | SUBCUTANEOUS | 1 refills | Status: DC

## 2022-04-01 MED ORDER — GABAPENTIN 300 MG PO CAPS: 600.0000 mg | ORAL_CAPSULE | Freq: Two times a day (BID) | ORAL | 3 refills | Status: DC

## 2022-04-01 MED ORDER — GLIPIZIDE 10 MG PO TABS: 10.0000 mg | ORAL_TABLET | Freq: Every day | ORAL | 3 refills | Status: DC

## 2022-04-01 MED ORDER — NITROGLYCERIN 0.4 MG SL SUBL: 0.4000 mg | SUBLINGUAL_TABLET | SUBLINGUAL | 3 refills | Status: AC | PRN

## 2022-04-01 MED ORDER — MECLIZINE HCL 50 MG PO TABS: 50.0000 mg | ORAL_TABLET | Freq: Three times a day (TID) | ORAL | 1 refills | Status: DC | PRN

## 2022-04-01 MED ORDER — ROSUVASTATIN CALCIUM 10 MG PO TABS: 10.0000 mg | ORAL_TABLET | Freq: Every day | ORAL | 3 refills | Status: DC

## 2022-04-01 NOTE — Progress Notes (Signed)
Subjective:  Patient ID: Natalie Tanner,  female    DOB: Sep 05, 1960  Age: 62 y.o.    CC: Annual Exam   HPI Natalie Tanner presents for  follow-up of hypertension. Patient has no history of headache chest pain or shortness of breath or recent cough. Patient also denies symptoms of TIA such as numbness weakness lateralizing. Patient denies side effects from medication. States taking it regularly.  Patient also  in for follow-up of elevated cholesterol. Doing well without complaints on current medication. Denies side effects  including myalgia and arthralgia and nausea. Also in today for liver function testing. Currently no chest pain, shortness of breath or other cardiovascular related symptoms noted.  Follow-up of diabetes. Patient does check blood sugar at home. Readings run between 120 and 190 Patient denies symptoms such as excessive hunger or urinary frequency, excessive hunger, nausea No significant hypoglycemic spells noted. Medications reviewed. Pt reports taking them regularly. Pt. denies complication/adverse reaction today.    History Natalie Tanner has a past medical history of Acute renal failure (ARF) ([place]), Anxiety, Coronary atherosclerosis of native coronary artery, Depression, Essential hypertension, GERD (gastroesophageal reflux disease), History of kidney stones, Iron deficiency anemia, Mixed hyperlipidemia, Myocardial infarction [facility]), Nephrolithiasis, Stroke ([place]) (2017), and Type 2 diabetes mellitus ([place]).   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   Her family history includes Other in her father; Stroke in her father.She reports that she quit smoking about 21 years ago. Her smoking use included cigarettes. She has a 20.00 pack-year smoking history. She has never used smokeless tobacco. She reports that she does not drink alcohol and does not use  drugs.  Current Outpatient Medications on File Prior to Visit  Medication Sig Dispense Refill   amLODipine (NORVASC) 5 MG tablet Take 1 tablet (5 mg total) by mouth daily. For blood pressure 90 tablet 3   aspirin EC 81 MG tablet Take 81 mg by mouth daily. Swallow whole.     celecoxib (CELEBREX) 200 MG capsule Take 1 capsule (200 mg total) by mouth daily. with food 90 capsule 3   cyanocobalamin (,VITAMIN B-12,) 1000 MCG/ML injection Inject 1 mL (1,000 mcg total) into the skin every 30 (thirty) days. 1 mL prn   ferrous sulfate 325 (65 FE) MG tablet Take 1 tablet (325 mg total) by mouth 3 (three) times daily after meals. 90 tablet 1   metoprolol tartrate (LOPRESSOR) 25 MG tablet Take 0.5 tablets (12.5 mg total) by mouth 2 (two) times daily. 90 tablet 3   omeprazole (PRILOSEC OTC) 20 MG tablet Take 1 tablet (20 mg total) by mouth daily. 90 tablet 3   blood glucose meter kit and supplies KIT Dispense based on patient and insurance preference. Use up to four times daily as directed. For diabetes E11.9, with hyperglycemia. 90 each 11   Current Facility-Administered Medications on File Prior to Visit  Medication Dose Route Frequency Provider Last Rate Last Admin   cyanocobalamin ((VITAMIN B-12)) injection 1,000 mcg  1,000 mcg Intramuscular Q30 days Natalie Tanner   1,000 mcg at 07/17/21 1419    ROS Review of Systems  Constitutional: Negative.   HENT: Negative.    Eyes:  Negative for visual disturbance.  Respiratory:  Negative for shortness of breath.   Cardiovascular:  Negative for chest pain.  Gastrointestinal:  Negative for abdominal pain.  Musculoskeletal:  Negative for arthralgias.    Objective:  BP 128/79  Pulse 81   Temp 98.1 F (36.7 C)   Ht '5\' 3"'$  (1.6 m)   Wt 219 lb (99.3 kg)   LMP 07/05/2010   SpO2 94%   BMI 38.79 kg/m   BP Readings from Last 3 Encounters:  04/01/22 128/79  12/25/21 111/73  09/24/21 112/74    Wt Readings from Last 3 Encounters:  04/01/22 219 lb  (99.3 kg)  12/25/21 208 lb 12.8 oz (94.7 kg)  09/24/21 210 lb (95.3 kg)     Physical Exam Constitutional:      General: She is not in acute distress.    Appearance: She is well-developed.  Cardiovascular:     Rate and Rhythm: Normal rate and regular rhythm.  Pulmonary:     Breath sounds: Normal breath sounds.  Musculoskeletal:        General: Normal range of motion.  Skin:    General: Skin is warm and dry.  Neurological:     Mental Status: She is alert and oriented to person, place, and time.     Diabetic Foot Exam - Simple   No data filed     Lab Results  Component Value Date   HGBA1C 6.0 (H) 12/25/2021   HGBA1C 5.4 09/24/2021   HGBA1C 6.8 (H) 06/18/2021    Assessment & Plan:   Natalie Tanner was seen today for annual exam.  Diagnoses and all orders for this visit:  Mixed hyperlipidemia -     Lipid panel  Type 2 diabetes mellitus with hyperglycemia, without long-term current use of insulin (HCC) -     Bayer DCA Hb A1c Waived -     glipiZIDE (GLUCOTROL) 10 MG tablet; Take 1 tablet (10 mg total) by mouth daily. -     Microalbumin / creatinine urine ratio  Essential hypertension, benign -     CBC with Differential/Platelet -     CMP14+EGFR  Anxiety -     ALPRAZolam (XANAX) 1 MG tablet; Take 1 tablet (1 mg total) by mouth 2 (two) times daily as needed.  Left sciatic nerve pain -     gabapentin (NEURONTIN) 300 MG capsule; Take 2 capsules (600 mg total) by mouth 2 (two) times daily.  Other orders -     rosuvastatin (CRESTOR) 10 MG tablet; Take 1 tablet (10 mg total) by mouth daily. -     nitroGLYCERIN (NITROSTAT) 0.4 MG SL tablet; Place 1 tablet (0.4 mg total) under the tongue every 5 (five) minutes as needed for chest pain. -     meclizine (ANTIVERT) 50 MG tablet; Take 1 tablet (50 mg total) by mouth 3 (three) times daily as needed. -     PARoxetine (PAXIL) 20 MG tablet; Take 1 tablet (20 mg total) by mouth every morning. -     Semaglutide,0.25 or 0.'5MG'$ /DOS,  (OZEMPIC, 0.25 OR 0.5 MG/DOSE,) 2 MG/3ML SOPN; Inject 0.5 mg into the skin once a week.   I have discontinued Natalie Tanner's pioglitazone. I am also having her maintain her blood glucose meter kit and supplies, ferrous sulfate, aspirin EC, cyanocobalamin, amLODipine, celecoxib, metoprolol tartrate, omeprazole, ALPRAZolam, glipiZIDE, rosuvastatin, gabapentin, nitroGLYCERIN, meclizine, PARoxetine, and Ozempic (0.25 or 0.5 MG/DOSE). We will continue to administer cyanocobalamin.  Meds ordered this encounter  Medications   ALPRAZolam (XANAX) 1 MG tablet    Sig: Take 1 tablet (1 mg total) by mouth 2 (two) times daily as needed.    Dispense:  180 tablet    Refill:  1   glipiZIDE (GLUCOTROL) 10 MG tablet  Sig: Take 1 tablet (10 mg total) by mouth daily.    Dispense:  90 tablet    Refill:  3   rosuvastatin (CRESTOR) 10 MG tablet    Sig: Take 1 tablet (10 mg total) by mouth daily.    Dispense:  90 tablet    Refill:  3   gabapentin (NEURONTIN) 300 MG capsule    Sig: Take 2 capsules (600 mg total) by mouth 2 (two) times daily.    Dispense:  360 capsule    Refill:  3   nitroGLYCERIN (NITROSTAT) 0.4 MG SL tablet    Sig: Place 1 tablet (0.4 mg total) under the tongue every 5 (five) minutes as needed for chest pain.    Dispense:  75 tablet    Refill:  3    ..Patient needs to contact office to schedule  Appointment  for future refills.[phone]. Thank you.   meclizine (ANTIVERT) 50 MG tablet    Sig: Take 1 tablet (50 mg total) by mouth 3 (three) times daily as needed.    Dispense:  90 tablet    Refill:  1   PARoxetine (PAXIL) 20 MG tablet    Sig: Take 1 tablet (20 mg total) by mouth every morning.    Dispense:  90 tablet    Refill:  2   Semaglutide,0.25 or 0.'5MG'$ /DOS, (OZEMPIC, 0.25 OR 0.5 MG/DOSE,) 2 MG/3ML SOPN    Sig: Inject 0.5 mg into the skin once a week.    Dispense:  6 mL    Refill:  1    Dx: E11.9 (DMII)     Follow-up: Return in about 3 months (around  07/01/2022).  Natalie Tanner, M.D.

## 2022-04-02 LAB — CMP14+EGFR
ALT: 12 IU/L (ref 0–32)
AST: 16 IU/L (ref 0–40)
Albumin/Globulin Ratio: 1.7 (ref 1.2–2.2)
Albumin: 4.2 g/dL (ref 3.8–4.9)
Alkaline Phosphatase: 68 IU/L (ref 44–121)
BUN/Creatinine Ratio: 19 (ref 12–28)
BUN: 17 mg/dL (ref 8–27)
Bilirubin Total: 0.9 mg/dL (ref 0.0–1.2)
CO2: 29 mmol/L (ref 20–29)
Calcium: 10 mg/dL (ref 8.7–10.3)
Chloride: 102 mmol/L (ref 96–106)
Creatinine, Ser: 0.9 mg/dL (ref 0.57–1.00)
Globulin, Total: 2.5 g/dL (ref 1.5–4.5)
Glucose: 138 mg/dL — ABNORMAL HIGH (ref 70–99)
Potassium: 3.9 mmol/L (ref 3.5–5.2)
Sodium: 144 mmol/L (ref 134–144)
Total Protein: 6.7 g/dL (ref 6.0–8.5)
eGFR: 73 mL/min/{1.73_m2} (ref >59)

## 2022-04-02 LAB — CBC WITH DIFFERENTIAL/PLATELET
Basophils Absolute: 0 10*3/uL (ref 0.0–0.2)
Basos: 1 %
EOS (ABSOLUTE): 0.3 10*3/uL (ref 0.0–0.4)
Eos: 4 %
Hematocrit: 41.7 % (ref 34.0–46.6)
Hemoglobin: 14 g/dL (ref 11.1–15.9)
Immature Grans (Abs): 0 10*3/uL (ref 0.0–0.1)
Immature Granulocytes: 0 %
Lymphocytes Absolute: 1.4 10*3/uL (ref 0.7–3.1)
Lymphs: 23 %
MCH: 32.3 pg (ref 26.6–33.0)
MCHC: 33.6 g/dL (ref 31.5–35.7)
MCV: 96 fL (ref 79–97)
Monocytes Absolute: 0.3 10*3/uL (ref 0.1–0.9)
Monocytes: 5 %
Neutrophils Absolute: 3.9 10*3/uL (ref 1.4–7.0)
Neutrophils: 67 %
Platelets: 195 10*3/uL (ref 150–450)
RBC: 4.34 x10E6/uL (ref 3.77–5.28)
RDW: 12.8 % (ref 11.7–15.4)
WBC: 5.9 10*3/uL (ref 3.4–10.8)

## 2022-04-02 LAB — LIPID PANEL
Chol/HDL Ratio: 2.3 ratio (ref 0.0–4.4)
Cholesterol, Total: 151 mg/dL (ref 100–199)
HDL: 66 mg/dL (ref >39)
LDL Chol Calc (NIH): 68 mg/dL (ref 0–99)
Triglycerides: 94 mg/dL (ref 0–149)
VLDL Cholesterol Cal: 17 mg/dL (ref 5–40)

## 2022-04-02 NOTE — Progress Notes (Signed)
Hello Lannette,  Your lab result is normal and/or stable.Some minor variations that are not significant are commonly marked abnormal, but do not represent any medical problem for you.  Best regards, Claretta Fraise, M.D.

## 2022-04-03 LAB — MICROALBUMIN / CREATININE URINE RATIO
Creatinine, Urine: 75.5 mg/dL
Microalb/Creat Ratio: 37 mg/g creat — ABNORMAL HIGH (ref 0–29)
Microalbumin, Urine: 28.2 ug/mL

## 2022-04-08 ENCOUNTER — Other Ambulatory Visit: Payer: Self-pay | Admitting: Family Medicine

## 2022-04-08 NOTE — Telephone Encounter (Signed)
meclizine (ANTIVERT) 50 MG tablet  Pharmacy comment: THE MAXIMUM RECOMMENDED DOSE OF MECLIZINE IS '100MG'$  PER DAY-TOTAL DAILY DOSE OF '150MG'$  PROMPTS CLARIFICATION. TO FILL AS WRITTEN, SEND DENIAL TO THIS REQUEST WITH REASON FILL IS APPROPRIATE, SELECT AN OPTION BELOW TO CHANGE DOSE, ORCANCEL THIS RX.

## 2022-04-30 ENCOUNTER — Encounter: Payer: Self-pay | Admitting: Family Medicine

## 2022-07-02 ENCOUNTER — Ambulatory Visit: Admitting: Family Medicine

## 2022-07-15 ENCOUNTER — Other Ambulatory Visit: Payer: Self-pay | Admitting: Family Medicine

## 2022-07-15 ENCOUNTER — Telehealth: Payer: Self-pay | Admitting: Family Medicine

## 2022-07-15 MED ORDER — OZEMPIC (0.25 OR 0.5 MG/DOSE) 2 MG/3ML ~~LOC~~ SOPN: 0.5000 mg | PEN_INJECTOR | SUBCUTANEOUS | 1 refills | Status: AC

## 2022-07-15 NOTE — Telephone Encounter (Signed)
Not on current med list, look like Rx expired Please advise

## 2022-07-15 NOTE — Telephone Encounter (Signed)
Please let the patient know that I sent their prescription to their pharmacy. Thanks, WS 

## 2022-07-15 NOTE — Telephone Encounter (Signed)
  Prescription Request  07/15/2022  Is this a "Controlled Substance" medicine? no  Have you seen your PCP in the last 2 weeks? 04/01/22  If YES, route message to pool  -  If NO, patient needs to be scheduled for appointment.  What is the name of the medication or equipment? ozempic  Have you contacted your pharmacy to request a refill? no   Which pharmacy would you like [facility] sent to? [facility]   Patient notified that their request is being sent to the clinical staff for review and that they should receive a response within 2 business days.

## 2022-08-05 ENCOUNTER — Ambulatory Visit (INDEPENDENT_AMBULATORY_CARE_PROVIDER_SITE_OTHER): Admitting: Family Medicine

## 2022-08-05 ENCOUNTER — Encounter: Payer: Self-pay | Admitting: Family Medicine

## 2022-08-05 VITALS — BP 134/68 | HR 72 | Temp 98.2°F | Ht 63.0 in | Wt 214.6 lb

## 2022-08-05 DIAGNOSIS — E782 Mixed hyperlipidemia: Secondary | ICD-10-CM | POA: Diagnosis not present

## 2022-08-05 DIAGNOSIS — F419 Anxiety disorder, unspecified: Secondary | ICD-10-CM

## 2022-08-05 DIAGNOSIS — Z23 Encounter for immunization: Secondary | ICD-10-CM | POA: Diagnosis not present

## 2022-08-05 DIAGNOSIS — M1711 Unilateral primary osteoarthritis, right knee: Secondary | ICD-10-CM

## 2022-08-05 DIAGNOSIS — I10 Essential (primary) hypertension: Secondary | ICD-10-CM

## 2022-08-05 DIAGNOSIS — E1165 Type 2 diabetes mellitus with hyperglycemia: Secondary | ICD-10-CM

## 2022-08-05 LAB — BAYER DCA HB A1C WAIVED: HB A1C (BAYER DCA - WAIVED): 6.7 % — ABNORMAL HIGH (ref 4.8–5.6)

## 2022-08-05 MED ORDER — AMLODIPINE BESYLATE 5 MG PO TABS: 5.0000 mg | ORAL_TABLET | Freq: Every day | ORAL | 3 refills | Status: DC

## 2022-08-05 MED ORDER — ALPRAZOLAM 1 MG PO TABS
1.0000 mg | ORAL_TABLET | Freq: Two times a day (BID) | ORAL | 1 refills | Status: DC | PRN
Start: 2022-08-05 — End: 2023-03-11

## 2022-08-05 NOTE — Progress Notes (Signed)
Subjective:  Patient ID: Natalie Tanner,  female    DOB: 1960/03/12  Age: 62 y.o.    CC: Medical Management of Chronic Issues   HPI  Says she is a summer person. Feels much better when the weather is warm      08/05/2022    2:10 PM 04/01/2022    3:08 PM 12/25/2021    3:41 PM 09/24/2021    3:55 PM  GAD 7 : Generalized Anxiety Score  Nervous, Anxious, on Edge 1 0 0 1  Control/stop worrying 0 0 0 0  Worry too much - different things 0 0 0 0  Trouble relaxing 1 2 2 1   Restless 1 0 0 0  Easily annoyed or irritable 1 3 2 1   Afraid - awful might happen 1 1 1 1   Total GAD 7 Score 5 6 5 4   Anxiety Difficulty Not difficult at all Not difficult at all  Somewhat difficult       08/05/2022    2:10 PM 08/05/2022    2:07 PM 04/01/2022    3:08 PM 04/01/2022    3:05 PM 12/25/2021    3:40 PM  Depression screen PHQ 2/9  Decreased Interest 1 0 2 0 2  Down, Depressed, Hopeless 1 0 2 0 2  PHQ - 2 Score 2 0 4 0 4  Altered sleeping 1  3  3   Tired, decreased energy 1  3  2   Change in appetite 1  3  3   Feeling bad or failure about yourself  1  1  0  Trouble concentrating 0  2  0  Moving slowly or fidgety/restless 0  2  0  Suicidal thoughts 0  0  0  PHQ-9 Score 6  18  12   Difficult doing work/chores Not difficult at all  Somewhat difficult  Somewhat difficult    Natalie Tanner presents for  follow-up of hypertension. Patient has no history of headache chest pain or shortness of breath or recent cough. Patient also denies symptoms of TIA such as numbness weakness lateralizing. Patient denies side effects from medication. States taking it regularly.  Patient also  in for follow-up of elevated cholesterol. Doing well without complaints on current medication. Denies side effects  including myalgia and arthralgia and nausea. Also in today for liver function testing. Currently no chest pain, shortness of breath or other cardiovascular related symptoms noted.  Follow-up of diabetes.  Patient does check blood sugar at home. Readings not good because she ran out of her Ozempic - out of stock. Came inn yesterday. Patient denies symptoms such as excessive hunger or urinary frequency, excessive hunger, nausea No significant hypoglycemic spells noted. Medications reviewed. Pt reports taking them regularly. Pt. denies complication/adverse reaction today.   Right knee painTylenol doesn't help. Lidocaine patch not helpful. Celebrex not enough. Requesting injection into the joint  History Natalie Tanner has a past medical history of Acute renal failure (ARF) (HCC), Anxiety, Coronary atherosclerosis of native coronary artery, Depression, Essential hypertension, GERD (gastroesophageal reflux disease), History of kidney stones, Iron deficiency anemia, Mixed hyperlipidemia, Myocardial infarction [facility]), Nephrolithiasis, Stroke (HCC) (2017), and Type 2 diabetes mellitus (HCC).   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   Her family history includes Other in her father; Stroke in her father.She reports that she quit smoking about 21 years ago. Her smoking use included cigarettes. She has a 20.00 pack-year smoking  history. She has never used smokeless tobacco. She reports that she does not drink alcohol and does not use drugs.  Current Outpatient Medications on File Prior to Visit  Medication Sig Dispense Refill   aspirin EC 81 MG tablet Take 81 mg by mouth daily. Swallow whole.     blood glucose meter kit and supplies KIT Dispense based on patient and insurance preference. Use up to four times daily as directed. For diabetes E11.9, with hyperglycemia. 90 each 11   celecoxib (CELEBREX) 200 MG capsule Take 1 capsule (200 mg total) by mouth daily. with food 90 capsule 3   cyanocobalamin (,VITAMIN B-12,) 1000 MCG/ML injection Inject 1 mL (1,000 mcg total) into the skin every 30 (thirty) days. 1 mL prn    ferrous sulfate 325 (65 FE) MG tablet Take 1 tablet (325 mg total) by mouth 3 (three) times daily after meals. 90 tablet 1   gabapentin (NEURONTIN) 300 MG capsule Take 2 capsules (600 mg total) by mouth 2 (two) times daily. 360 capsule 3   glipiZIDE (GLUCOTROL) 10 MG tablet Take 1 tablet (10 mg total) by mouth daily. 90 tablet 3   meclizine (ANTIVERT) 50 MG tablet TAKE ONE TABLET BY MOUTH BID AS NEEDED (CHANGE DOSE TO MAXIMUM DOSE RECOMMENDATION) 60 tablet 1   metoprolol tartrate (LOPRESSOR) 25 MG tablet Take 0.5 tablets (12.5 mg total) by mouth 2 (two) times daily. 90 tablet 3   nitroGLYCERIN (NITROSTAT) 0.4 MG SL tablet Place 1 tablet (0.4 mg total) under the tongue every 5 (five) minutes as needed for chest pain. 75 tablet 3   omeprazole (PRILOSEC OTC) 20 MG tablet Take 1 tablet (20 mg total) by mouth daily. 90 tablet 3   PARoxetine (PAXIL) 20 MG tablet Take 1 tablet (20 mg total) by mouth every morning. 90 tablet 2   rosuvastatin (CRESTOR) 10 MG tablet Take 1 tablet (10 mg total) by mouth daily. 90 tablet 3   Semaglutide,0.25 or 0.5MG /DOS, (OZEMPIC, 0.25 OR 0.5 MG/DOSE,) 2 MG/3ML SOPN Inject 0.5 mg into the skin once a week. 6 mL 1   No current facility-administered medications on file prior to visit.    ROS Review of Systems  Constitutional: Negative.   HENT: Negative.    Eyes:  Negative for visual disturbance.  Respiratory:  Negative for shortness of breath.   Cardiovascular:  Negative for chest pain.  Gastrointestinal:  Negative for abdominal pain.  Musculoskeletal:  Negative for arthralgias.    Objective:  BP 134/68   Pulse 72   Temp 98.2 F (36.8 C)   Ht 5\' 3"  (1.6 m)   Wt 214 lb 9.6 oz (97.3 kg)   LMP 07/05/2010   SpO2 97%   BMI 38.01 kg/m   BP Readings from Last 3 Encounters:  08/05/22 134/68  04/01/22 128/79  12/25/21 111/73    Wt Readings from Last 3 Encounters:  08/05/22 214 lb 9.6 oz (97.3 kg)  04/01/22 219 lb (99.3 kg)  12/25/21 208 lb 12.8 oz (94.7  kg)     Physical Exam Constitutional:      General: She is not in acute distress.    Appearance: She is well-developed.  Cardiovascular:     Rate and Rhythm: Normal rate and regular rhythm.  Pulmonary:     Breath sounds: Normal breath sounds.  Musculoskeletal:        General: Normal range of motion.  Skin:    General: Skin is warm and dry.  Neurological:     Mental Status: She  is alert and oriented to person, place, and time.     A steroid injection was performed at medial aspect right knee joint using 1% plain Lidocaine and 9 mg of Celestone. This was well tolerated. Local with ethyl chloride    Lab Results  Component Value Date   HGBA1C 5.6 04/01/2022   HGBA1C 6.0 (H) 12/25/2021   HGBA1C 5.4 09/24/2021    Assessment & Plan:   Olesia was seen today for medical management of chronic issues.  Diagnoses and all orders for this visit:  Type 2 diabetes mellitus with hyperglycemia, without long-term current use of insulin (HCC) -     Bayer DCA Hb A1c Waived  Mixed hyperlipidemia -     Lipid panel  Essential hypertension, benign -     CBC with Differential/Platelet -     CMP14+EGFR  Anxiety -     ALPRAZolam (XANAX) 1 MG tablet; Take 1 tablet (1 mg total) by mouth 2 (two) times daily as needed.  Need for Tdap vaccination -     Tdap vaccine greater than or equal to 7yo IM  Arthritis of right knee  Other orders -     amLODipine (NORVASC) 5 MG tablet; Take 1 tablet (5 mg total) by mouth daily. For blood pressure   I am having Adalie R. Tanner maintain her blood glucose meter kit and supplies, ferrous sulfate, aspirin EC, cyanocobalamin, celecoxib, metoprolol tartrate, omeprazole, glipiZIDE, rosuvastatin, gabapentin, nitroGLYCERIN, PARoxetine, meclizine, Ozempic (0.25 or 0.5 MG/DOSE), ALPRAZolam, and amLODipine.  Meds ordered this encounter  Medications   ALPRAZolam (XANAX) 1 MG tablet    Sig: Take 1 tablet (1 mg total) by mouth 2 (two) times daily as needed.     Dispense:  180 tablet    Refill:  1   amLODipine (NORVASC) 5 MG tablet    Sig: Take 1 tablet (5 mg total) by mouth daily. For blood pressure    Dispense:  90 tablet    Refill:  3     Follow-up: Return in about 3 months (around 11/05/2022).  Mechele Claude, M.D.

## 2022-08-06 LAB — CBC WITH DIFFERENTIAL/PLATELET
Basophils Absolute: 0 10*3/uL (ref 0.0–0.2)
Basos: 0 %
EOS (ABSOLUTE): 0.2 10*3/uL (ref 0.0–0.4)
Eos: 4 %
Hematocrit: 42.8 % (ref 34.0–46.6)
Hemoglobin: 14 g/dL (ref 11.1–15.9)
Immature Grans (Abs): 0 10*3/uL (ref 0.0–0.1)
Immature Granulocytes: 0 %
Lymphocytes Absolute: 1.6 10*3/uL (ref 0.7–3.1)
Lymphs: 33 %
MCH: 31.6 pg (ref 26.6–33.0)
MCHC: 32.7 g/dL (ref 31.5–35.7)
MCV: 97 fL (ref 79–97)
Monocytes Absolute: 0.2 10*3/uL (ref 0.1–0.9)
Monocytes: 4 %
Neutrophils Absolute: 2.9 10*3/uL (ref 1.4–7.0)
Neutrophils: 59 %
Platelets: 160 10*3/uL (ref 150–450)
RBC: 4.43 x10E6/uL (ref 3.77–5.28)
RDW: 11.7 % (ref 11.7–15.4)
WBC: 5 10*3/uL (ref 3.4–10.8)

## 2022-08-06 LAB — CMP14+EGFR
ALT: 19 IU/L (ref 0–32)
AST: 21 IU/L (ref 0–40)
Albumin/Globulin Ratio: 1.5 (ref 1.2–2.2)
Albumin: 3.8 g/dL — ABNORMAL LOW (ref 3.9–4.9)
Alkaline Phosphatase: 65 IU/L (ref 44–121)
BUN/Creatinine Ratio: 10 — ABNORMAL LOW (ref 12–28)
BUN: 9 mg/dL (ref 8–27)
Bilirubin Total: 0.9 mg/dL (ref 0.0–1.2)
CO2: 25 mmol/L (ref 20–29)
Calcium: 8.8 mg/dL (ref 8.7–10.3)
Chloride: 104 mmol/L (ref 96–106)
Creatinine, Ser: 0.87 mg/dL (ref 0.57–1.00)
Globulin, Total: 2.5 g/dL (ref 1.5–4.5)
Glucose: 252 mg/dL — ABNORMAL HIGH (ref 70–99)
Potassium: 3.6 mmol/L (ref 3.5–5.2)
Sodium: 137 mmol/L (ref 134–144)
Total Protein: 6.3 g/dL (ref 6.0–8.5)
eGFR: 76 mL/min/{1.73_m2} (ref >59)

## 2022-08-06 LAB — LIPID PANEL
Chol/HDL Ratio: 2.9 ratio (ref 0.0–4.4)
Cholesterol, Total: 128 mg/dL (ref 100–199)
HDL: 44 mg/dL (ref >39)
LDL Chol Calc (NIH): 65 mg/dL (ref 0–99)
Triglycerides: 100 mg/dL (ref 0–149)
VLDL Cholesterol Cal: 19 mg/dL (ref 5–40)

## 2022-08-06 NOTE — Progress Notes (Signed)
Hello Caasi,  Your lab result is normal and/or stable.Some minor variations that are not significant are commonly marked abnormal, but do not represent any medical problem for you.  Best regards, Oshen Wlodarczyk, M.D.

## 2022-11-11 ENCOUNTER — Ambulatory Visit: Admitting: Family Medicine

## 2022-12-17 ENCOUNTER — Ambulatory Visit (INDEPENDENT_AMBULATORY_CARE_PROVIDER_SITE_OTHER): Admitting: Family Medicine

## 2022-12-17 ENCOUNTER — Encounter: Payer: Self-pay | Admitting: Family Medicine

## 2022-12-17 VITALS — BP 131/79 | HR 74 | Temp 97.4°F | Ht 63.0 in | Wt 205.0 lb

## 2022-12-17 DIAGNOSIS — E782 Mixed hyperlipidemia: Secondary | ICD-10-CM | POA: Diagnosis not present

## 2022-12-17 DIAGNOSIS — I10 Essential (primary) hypertension: Secondary | ICD-10-CM

## 2022-12-17 DIAGNOSIS — E1165 Type 2 diabetes mellitus with hyperglycemia: Secondary | ICD-10-CM

## 2022-12-17 DIAGNOSIS — M150 Primary generalized (osteo)arthritis: Secondary | ICD-10-CM

## 2022-12-17 DIAGNOSIS — Z20820 Contact with and (suspected) exposure to varicella: Secondary | ICD-10-CM

## 2022-12-17 DIAGNOSIS — Z7984 Long term (current) use of oral hypoglycemic drugs: Secondary | ICD-10-CM

## 2022-12-17 DIAGNOSIS — M25561 Pain in right knee: Secondary | ICD-10-CM | POA: Diagnosis not present

## 2022-12-17 LAB — BAYER DCA HB A1C WAIVED: HB A1C (BAYER DCA - WAIVED): 6.9 % — ABNORMAL HIGH (ref 4.8–5.6)

## 2022-12-17 MED ORDER — OMEPRAZOLE MAGNESIUM 20 MG PO TBEC: 20.0000 mg | DELAYED_RELEASE_TABLET | Freq: Every day | ORAL | 3 refills | Status: DC

## 2022-12-17 MED ORDER — CELECOXIB 200 MG PO CAPS: 200.0000 mg | ORAL_CAPSULE | Freq: Every day | ORAL | 3 refills | Status: DC

## 2022-12-17 MED ORDER — OZEMPIC (1 MG/DOSE) 4 MG/3ML ~~LOC~~ SOPN: 1.0000 mg | PEN_INJECTOR | SUBCUTANEOUS | 5 refills | Status: DC

## 2022-12-17 MED ORDER — METOPROLOL TARTRATE 25 MG PO TABS: 12.5000 mg | ORAL_TABLET | Freq: Two times a day (BID) | ORAL | 3 refills | Status: DC

## 2022-12-17 NOTE — Progress Notes (Addendum)
Subjective:  Patient ID: Natalie Tanner, female    DOB: Jan 02, 1961  Age: 62 y.o. MRN: [id number]  CC: Medical Management of Chronic Issues   HPI Colista Mccrobie presents for  follow-up of hypertension. Patient has no history of headache chest pain or shortness of breath or recent cough. Patient also denies symptoms of TIA such as focal numbness or weakness. Patient denies side effects from medication. States taking it regularly.  presents forFollow-up of diabetes. Patient doesn't check blood sugar at home. Concerned she never had chickenpox. Should she avoid people who have it? Should she take shingles vax.  Patient denies symptoms such as polyuria, polydipsia, excessive hunger, nausea No significant hypoglycemic spells noted. Medications reviewed. Pt reports taking them regularly without complication/adverse reaction being reported today.  Lab Results  Component Value Date   HGBA1C 6.7 (H) 08/05/2022   HGBA1C 5.6 04/01/2022   HGBA1C 6.0 (H) 12/25/2021     History Dinita has a past medical history of Acute renal failure (ARF) (HCC), Anxiety, Coronary atherosclerosis of native coronary artery, Depression, Essential hypertension, GERD (gastroesophageal reflux disease), History of kidney stones, Iron deficiency anemia, Mixed hyperlipidemia, Myocardial infarction [facility]), Nephrolithiasis, Stroke (HCC) (2017), and Type 2 diabetes mellitus (HCC).   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   Her family history includes Other in her father; Stroke in her father.She reports that she quit smoking about 21 years ago. Her smoking use included cigarettes. She started smoking about 41 years ago. She has a 20 pack-year smoking history. She has never used smokeless tobacco. She reports that she does not drink alcohol and does not use drugs.  Current Outpatient Medications on File Prior  to Visit  Medication Sig Dispense Refill   ALPRAZolam (XANAX) 1 MG tablet Take 1 tablet (1 mg total) by mouth 2 (two) times daily as needed. 180 tablet 1   amLODipine (NORVASC) 5 MG tablet Take 1 tablet (5 mg total) by mouth daily. For blood pressure 90 tablet 3   aspirin EC 81 MG tablet Take 81 mg by mouth daily. Swallow whole.     blood glucose meter kit and supplies KIT Dispense based on patient and insurance preference. Use up to four times daily as directed. For diabetes E11.9, with hyperglycemia. 90 each 11   cyanocobalamin (,VITAMIN B-12,) 1000 MCG/ML injection Inject 1 mL (1,000 mcg total) into the skin every 30 (thirty) days. 1 mL prn   ferrous sulfate 325 (65 FE) MG tablet Take 1 tablet (325 mg total) by mouth 3 (three) times daily after meals. 90 tablet 1   gabapentin (NEURONTIN) 300 MG capsule Take 2 capsules (600 mg total) by mouth 2 (two) times daily. 360 capsule 3   meclizine (ANTIVERT) 50 MG tablet TAKE ONE TABLET BY MOUTH BID AS NEEDED (CHANGE DOSE TO MAXIMUM DOSE RECOMMENDATION) 60 tablet 1   nitroGLYCERIN (NITROSTAT) 0.4 MG SL tablet Place 1 tablet (0.4 mg total) under the tongue every 5 (five) minutes as needed for chest pain. 75 tablet 3   PARoxetine (PAXIL) 20 MG tablet Take 1 tablet (20 mg total) by mouth every morning. 90 tablet 2   rosuvastatin (CRESTOR) 10 MG tablet Take 1 tablet (10 mg total) by mouth daily. 90 tablet 3   No current facility-administered medications on file prior to visit.    ROS Review of Systems  Constitutional: Negative.   HENT: Negative.    Eyes:  Negative for visual disturbance.  Respiratory:  Negative for shortness of breath.   Cardiovascular:  Negative for chest pain.  Gastrointestinal:  Negative for abdominal pain.  Musculoskeletal:  Positive for arthralgias (knees).    Objective:  BP 131/79   Pulse 74   Temp (!) 97.4 F (36.3 C)   Ht 5\' 3"  (1.6 m)   Wt 205 lb (93 kg)   LMP 07/05/2010   SpO2 95%   BMI 36.31 kg/m   BP Readings  from Last 3 Encounters:  12/17/22 131/79  08/05/22 134/68  04/01/22 128/79    Wt Readings from Last 3 Encounters:  12/17/22 205 lb (93 kg)  08/05/22 214 lb 9.6 oz (97.3 kg)  04/01/22 219 lb (99.3 kg)     Physical Exam Constitutional:      General: She is not in acute distress.    Appearance: She is well-developed.  HENT:     Head: Normocephalic and atraumatic.  Eyes:     Conjunctiva/sclera: Conjunctivae normal.     Pupils: Pupils are equal, round, and reactive to light.  Neck:     Thyroid: No thyromegaly.  Cardiovascular:     Rate and Rhythm: Normal rate and regular rhythm.     Heart sounds: Normal heart sounds. No murmur heard. Pulmonary:     Effort: Pulmonary effort is normal. No respiratory distress.     Breath sounds: Normal breath sounds. No wheezing or rales.  Abdominal:     General: Bowel sounds are normal. There is no distension.     Palpations: Abdomen is soft.     Tenderness: There is no abdominal tenderness.  Musculoskeletal:        General: Tenderness (right knee for palpation) present. Normal range of motion.     Cervical back: Normal range of motion and neck supple.  Lymphadenopathy:     Cervical: No cervical adenopathy.  Skin:    General: Skin is warm and dry.  Neurological:     Mental Status: She is alert and oriented to person, place, and time.  Psychiatric:        Behavior: Behavior normal.        Thought Content: Thought content normal.        Judgment: Judgment normal.       Assessment & Plan:   Allergies as of 12/17/2022       Reactions   Metformin And Related Diarrhea        Medication List        Accurate as of December 17, 2022  2:18 PM. If you have any questions, ask your nurse or doctor.          STOP taking these medications    glipiZIDE 10 MG tablet Commonly known as: GLUCOTROL Stopped by: Shamika Pedregon       TAKE these medications    ALPRAZolam 1 MG tablet Commonly known as: XANAX Take 1 tablet (1 mg  total) by mouth 2 (two) times daily as needed.   amLODipine 5 MG tablet Commonly known as: NORVASC Take 1 tablet (5 mg total) by mouth daily. For blood pressure   aspirin EC 81 MG tablet Take 81 mg by mouth daily. Swallow whole.   blood glucose meter kit and supplies Kit Dispense based on patient and insurance preference. Use up to four times daily as directed. For diabetes E11.9, with hyperglycemia.   celecoxib 200 MG capsule Commonly known as: CeleBREX Take 1 capsule (200 mg total) by mouth daily. with food  cyanocobalamin 1000 MCG/ML injection Commonly known as: VITAMIN B12 Inject 1 mL (1,000 mcg total) into the skin every 30 (thirty) days.   ferrous sulfate 325 (65 FE) MG tablet Take 1 tablet (325 mg total) by mouth 3 (three) times daily after meals.   gabapentin 300 MG capsule Commonly known as: NEURONTIN Take 2 capsules (600 mg total) by mouth 2 (two) times daily.   meclizine 50 MG tablet Commonly known as: ANTIVERT TAKE ONE TABLET BY MOUTH BID AS NEEDED (CHANGE DOSE TO MAXIMUM DOSE RECOMMENDATION)   metoprolol tartrate 25 MG tablet Commonly known as: LOPRESSOR Take 0.5 tablets (12.5 mg total) by mouth 2 (two) times daily.   nitroGLYCERIN 0.4 MG SL tablet Commonly known as: Nitrostat Place 1 tablet (0.4 mg total) under the tongue every 5 (five) minutes as needed for chest pain.   omeprazole 20 MG tablet Commonly known as: PRILOSEC OTC Take 1 tablet (20 mg total) by mouth daily.   Ozempic (1 MG/DOSE) 4 MG/3ML Sopn Generic drug: Semaglutide (1 MG/DOSE) Inject 1 mg into the skin once a week. Started by: Tiney Zipper   PARoxetine 20 MG tablet Commonly known as: PAXIL Take 1 tablet (20 mg total) by mouth every morning.   rosuvastatin 10 MG tablet Commonly known as: CRESTOR Take 1 tablet (10 mg total) by mouth daily.        Meds ordered this encounter  Medications   celecoxib (CELEBREX) 200 MG capsule    Sig: Take 1 capsule (200 mg total) by mouth  daily. with food    Dispense:  90 capsule    Refill:  3   metoprolol tartrate (LOPRESSOR) 25 MG tablet    Sig: Take 0.5 tablets (12.5 mg total) by mouth 2 (two) times daily.    Dispense:  90 tablet    Refill:  3   omeprazole (PRILOSEC OTC) 20 MG tablet    Sig: Take 1 tablet (20 mg total) by mouth daily.    Dispense:  90 tablet    Refill:  3   Semaglutide, 1 MG/DOSE, (OZEMPIC, 1 MG/DOSE,) 4 MG/3ML SOPN    Sig: Inject 1 mg into the skin once a week.    Dispense:  3 mL    Refill:  5     After obtaining informed consent, A steroid injection was performed at the right knee. The lateral compartment was cleansed with betadine. With 1.5 inch, 23 gauge needle using 2.5 ml marcan and 9 mg of Celestone the joint space was entered and injected. This was well tolerated. Simple dressing applied.   Follow-up: Return in about 3 months (around 03/19/2023).  Mechele Claude, M.D.

## 2022-12-18 LAB — CBC WITH DIFFERENTIAL/PLATELET
Basophils Absolute: 0 10*3/uL (ref 0.0–0.2)
Basos: 1 %
EOS (ABSOLUTE): 0.2 10*3/uL (ref 0.0–0.4)
Eos: 4 %
Hematocrit: 45.5 % (ref 34.0–46.6)
Hemoglobin: 15.5 g/dL (ref 11.1–15.9)
Immature Grans (Abs): 0 10*3/uL (ref 0.0–0.1)
Immature Granulocytes: 0 %
Lymphocytes Absolute: 1.6 10*3/uL (ref 0.7–3.1)
Lymphs: 31 %
MCH: 32.5 pg (ref 26.6–33.0)
MCHC: 34.1 g/dL (ref 31.5–35.7)
MCV: 95 fL (ref 79–97)
Monocytes Absolute: 0.2 10*3/uL (ref 0.1–0.9)
Monocytes: 4 %
Neutrophils Absolute: 3.1 10*3/uL (ref 1.4–7.0)
Neutrophils: 60 %
Platelets: 204 10*3/uL (ref 150–450)
RBC: 4.77 x10E6/uL (ref 3.77–5.28)
RDW: 11.9 % (ref 11.7–15.4)
WBC: 5.2 10*3/uL (ref 3.4–10.8)

## 2022-12-18 LAB — CMP14+EGFR
ALT: 16 IU/L (ref 0–32)
AST: 18 IU/L (ref 0–40)
Albumin: 4 g/dL (ref 3.9–4.9)
Alkaline Phosphatase: 69 IU/L (ref 44–121)
BUN/Creatinine Ratio: 13 (ref 12–28)
BUN: 11 mg/dL (ref 8–27)
Bilirubin Total: 1.4 mg/dL — ABNORMAL HIGH (ref 0.0–1.2)
CO2: 24 mmol/L (ref 20–29)
Calcium: 8.9 mg/dL (ref 8.7–10.3)
Chloride: 99 mmol/L (ref 96–106)
Creatinine, Ser: 0.87 mg/dL (ref 0.57–1.00)
Globulin, Total: 2.4 g/dL (ref 1.5–4.5)
Glucose: 175 mg/dL — ABNORMAL HIGH (ref 70–99)
Potassium: 4 mmol/L (ref 3.5–5.2)
Sodium: 138 mmol/L (ref 134–144)
Total Protein: 6.4 g/dL (ref 6.0–8.5)
eGFR: 76 mL/min/{1.73_m2} (ref >59)

## 2022-12-18 LAB — LIPID PANEL
Cholesterol, Total: 146 mg/dL (ref 100–199)
HDL: 45 mg/dL (ref >39)
LDL CALC COMMENT:: 3.2 ratio (ref 0.0–4.4)
LDL Chol Calc (NIH): 76 mg/dL (ref 0–99)
Triglycerides: 140 mg/dL (ref 0–149)
VLDL Cholesterol Cal: 25 mg/dL (ref 5–40)

## 2022-12-25 LAB — SPECIMEN STATUS REPORT

## 2022-12-27 ENCOUNTER — Telehealth: Payer: Self-pay | Admitting: Family Medicine

## 2022-12-27 LAB — SPECIMEN STATUS REPORT

## 2022-12-27 LAB — VARICELLA ZOSTER ANTIBODY, IGG: Varicella zoster IgG: REACTIVE

## 2022-12-27 MED ORDER — OZEMPIC (1 MG/DOSE) 4 MG/3ML ~~LOC~~ SOPN: 1.0000 mg | PEN_INJECTOR | SUBCUTANEOUS | 5 refills | Status: DC

## 2022-12-27 NOTE — Telephone Encounter (Signed)
Refills sent to [facility] today with diagnosis code E11.9

## 2023-01-07 ENCOUNTER — Other Ambulatory Visit: Payer: Self-pay | Admitting: Family Medicine

## 2023-02-04 ENCOUNTER — Telehealth: Payer: Self-pay | Admitting: Family Medicine

## 2023-03-11 ENCOUNTER — Telehealth: Payer: Self-pay | Admitting: Family Medicine

## 2023-03-11 ENCOUNTER — Encounter: Payer: Self-pay | Admitting: Family Medicine

## 2023-03-11 ENCOUNTER — Ambulatory Visit (INDEPENDENT_AMBULATORY_CARE_PROVIDER_SITE_OTHER): Admitting: Family Medicine

## 2023-03-11 VITALS — BP 129/86 | HR 72 | Temp 96.9°F | Ht 63.0 in | Wt 197.6 lb

## 2023-03-11 DIAGNOSIS — E1165 Type 2 diabetes mellitus with hyperglycemia: Secondary | ICD-10-CM | POA: Diagnosis not present

## 2023-03-11 DIAGNOSIS — I10 Essential (primary) hypertension: Secondary | ICD-10-CM | POA: Diagnosis not present

## 2023-03-11 DIAGNOSIS — E782 Mixed hyperlipidemia: Secondary | ICD-10-CM

## 2023-03-11 DIAGNOSIS — Z79899 Other long term (current) drug therapy: Secondary | ICD-10-CM | POA: Diagnosis not present

## 2023-03-11 DIAGNOSIS — F419 Anxiety disorder, unspecified: Secondary | ICD-10-CM

## 2023-03-11 DIAGNOSIS — M5432 Sciatica, left side: Secondary | ICD-10-CM

## 2023-03-11 DIAGNOSIS — Z1211 Encounter for screening for malignant neoplasm of colon: Secondary | ICD-10-CM

## 2023-03-11 LAB — BAYER DCA HB A1C WAIVED: HB A1C (BAYER DCA - WAIVED): 7.1 % — ABNORMAL HIGH (ref 4.8–5.6)

## 2023-03-11 MED ORDER — PAROXETINE HCL 20 MG PO TABS: 20.0000 mg | ORAL_TABLET | Freq: Every morning | ORAL | 1 refills | Status: DC

## 2023-03-11 MED ORDER — GABAPENTIN 300 MG PO CAPS: 600.0000 mg | ORAL_CAPSULE | Freq: Two times a day (BID) | ORAL | 3 refills | Status: DC

## 2023-03-11 MED ORDER — OZEMPIC (1 MG/DOSE) 4 MG/3ML ~~LOC~~ SOPN: 1.0000 mg | PEN_INJECTOR | SUBCUTANEOUS | 1 refills | Status: DC

## 2023-03-11 MED ORDER — ROSUVASTATIN CALCIUM 10 MG PO TABS: 10.0000 mg | ORAL_TABLET | Freq: Every day | ORAL | 1 refills | Status: DC

## 2023-03-11 MED ORDER — ALPRAZOLAM 1 MG PO TABS: 1.0000 mg | ORAL_TABLET | Freq: Two times a day (BID) | ORAL | 1 refills | Status: DC | PRN

## 2023-03-11 NOTE — Telephone Encounter (Signed)
 REFERRAL REQUEST Telephone Note  Have you been seen at our office for this problem? Yes pt seen today and did not see referral order on AVS. (Advise that they may need an appointment with their PCP before a referral can be done)  Reason for Referral: annual colonoscopy Referral discussed with patient: yes  Best contact number of patient for referral team: [phone]    Has patient been seen by a specialist for this issue before: no  Patient provider preference for referral: [facility] Patient location preference for referral: [place]   Patient notified that referrals can take up to a week or longer to process. If they haven't heard anything within a week they should call back and speak with the referral department.

## 2023-03-11 NOTE — Telephone Encounter (Signed)
 It was done. I just didn't sign it until after the AVS was printed

## 2023-03-11 NOTE — Progress Notes (Signed)
 Subjective:  Patient ID: Natalie Tanner,  female    DOB: [birth year]  Age: 63 y.o.    CC: Medical Management of Chronic Issues (3 month follow up )   HPI Aubree Doody presents for  follow-up of hypertension. Patient has no history of headache chest pain or shortness of breath or recent cough. Patient also denies symptoms of TIA such as numbness weakness lateralizing. Patient denies side effects from medication. States taking it regularly.  Patient also  in for follow-up of elevated cholesterol. Doing well without complaints on current medication. Denies side effects  including myalgia and arthralgia and nausea. Also in today for liver function testing. Currently no chest pain, shortness of breath or other cardiovascular related symptoms noted.  Follow-up of diabetes. Patient does not check blood sugar at home.  Patient denies symptoms such as excessive hunger or urinary frequency, excessive hunger, nausea No significant hypoglycemic spells noted. Medications reviewed. Pt reports taking them regularly. Pt. denies complication/adverse reaction today.      03/11/2023    1:31 PM 12/17/2022    1:51 PM 12/17/2022    1:29 PM 08/05/2022    2:10 PM 08/05/2022    2:07 PM  Depression screen PHQ 2/9  Decreased Interest 2 3 0 1 0  Down, Depressed, Hopeless 2 1 0 1 0  PHQ - 2 Score 4 4 0 2 0  Altered sleeping 0 1  1   Tired, decreased energy 0 3  1   Change in appetite 2 2  1    Feeling bad or failure about yourself  1 1  1    Trouble concentrating 3 0  0   Moving slowly or fidgety/restless 1 1  0   Suicidal thoughts 0 0  0   PHQ-9 Score 11 12  6    Difficult doing work/chores Not difficult at all Somewhat difficult  Not difficult at all       03/11/2023    1:32 PM 12/17/2022    1:51 PM 08/05/2022    2:10 PM 04/01/2022    3:08 PM  GAD 7 : Generalized Anxiety Score  Nervous, Anxious, on Edge 1 0 1 0  Control/stop worrying 0 0 0 0  Worry too much - different things 2 0 0 0   Trouble relaxing 2 1 1 2   Restless 2 0 1 0  Easily annoyed or irritable 2 2 1 3   Afraid - awful might happen 2 2 1 1   Total GAD 7 Score 11 5 5 6   Anxiety Difficulty Somewhat difficult Somewhat difficult Not difficult at all Not difficult at all     History Tamasha has a past medical history of Acute renal failure (ARF) (HCC), Anxiety, Coronary atherosclerosis of native coronary artery, Depression, Essential hypertension, GERD (gastroesophageal reflux disease), History of kidney stones, Iron deficiency anemia, Mixed hyperlipidemia, Myocardial infarction (HCC), Nephrolithiasis, Stroke (HCC) (2017), and Type 2 diabetes mellitus (HCC).   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   Her family history includes Other in her father; Stroke in her father.She reports that she quit smoking about 22 years ago. Her smoking use included cigarettes. She started smoking about 42 years ago. She has a 20 pack-year smoking history. She has never used smokeless tobacco. She reports that she does not drink alcohol and does not use drugs.  Current Outpatient Medications on File Prior to Visit  Medication Sig Dispense Refill  amLODipine  (NORVASC ) 5 MG tablet Take 1 tablet (5 mg total) by mouth daily. For blood pressure 90 tablet 3   aspirin  EC 81 MG tablet Take 81 mg by mouth daily. Swallow whole.     blood glucose meter kit and supplies KIT Dispense based on patient and insurance preference. Use up to four times daily as directed. For diabetes E11.9, with hyperglycemia. 90 each 11   celecoxib  (CELEBREX ) 200 MG capsule Take 1 capsule (200 mg total) by mouth daily. with food 90 capsule 3   cyanocobalamin  (,VITAMIN B-12,) 1000 MCG/ML injection Inject 1 mL (1,000 mcg total) into the skin every 30 (thirty) days. 1 mL prn   ferrous sulfate  325 (65 FE) MG tablet Take 1 tablet (325 mg total) by mouth 3 (three) times  daily after meals. 90 tablet 1   meclizine  (ANTIVERT ) 50 MG tablet TAKE ONE TABLET BY MOUTH BID AS NEEDED (CHANGE DOSE TO MAXIMUM DOSE RECOMMENDATION) 60 tablet 1   metoprolol  tartrate (LOPRESSOR ) 25 MG tablet Take 0.5 tablets (12.5 mg total) by mouth 2 (two) times daily. 90 tablet 3   nitroGLYCERIN  (NITROSTAT ) 0.4 MG SL tablet Place 1 tablet (0.4 mg total) under the tongue every 5 (five) minutes as needed for chest pain. 75 tablet 3   omeprazole  (PRILOSEC  OTC) 20 MG tablet Take 1 tablet (20 mg total) by mouth daily. 90 tablet 3   No current facility-administered medications on file prior to visit.    ROS Review of Systems  Constitutional: Negative.   HENT: Negative.    Eyes:  Negative for visual disturbance.  Respiratory:  Negative for shortness of breath.   Cardiovascular:  Negative for chest pain.  Gastrointestinal:  Negative for abdominal pain.  Musculoskeletal:  Positive for arthralgias (right knee).    Objective:  BP 129/86   Pulse 72   Temp (!) 96.9 F (36.1 C)   Ht 5' 3 (1.6 m)   Wt 197 lb 9.6 oz (89.6 kg)   LMP 07/05/2010   SpO2 96%   BMI 35.00 kg/m   BP Readings from Last 3 Encounters:  03/11/23 129/86  12/17/22 131/79  08/05/22 134/68    Wt Readings from Last 3 Encounters:  03/11/23 197 lb 9.6 oz (89.6 kg)  12/17/22 205 lb (93 kg)  08/05/22 214 lb 9.6 oz (97.3 kg)     Physical Exam Constitutional:      General: She is not in acute distress.    Appearance: She is well-developed.  Cardiovascular:     Rate and Rhythm: Normal rate and regular rhythm.  Pulmonary:     Breath sounds: Normal breath sounds.  Musculoskeletal:        General: Normal range of motion.  Skin:    General: Skin is warm and dry.  Neurological:     Mental Status: She is alert and oriented to person, place, and time.     Diabetic Foot Exam - Simple   No data filed     Lab Results  Component Value Date   HGBA1C 7.1 (H) 03/11/2023   HGBA1C 6.9 (H) 12/17/2022   HGBA1C 6.7  (H) 08/05/2022    Assessment & Plan:   Valrie was seen today for medical management of chronic issues.  Diagnoses and all orders for this visit:  Type 2 diabetes mellitus with hyperglycemia, without long-term current use of insulin  (HCC) -     Bayer DCA Hb A1c Waived  Mixed hyperlipidemia -     Lipid panel  Essential hypertension, benign -  CBC with Differential/Platelet -     CMP14+EGFR  Controlled substance agreement signed -     ToxASSURE Select 13 (MW), Urine  Anxiety -     ToxASSURE Select 13 (MW), Urine -     ALPRAZolam  (XANAX ) 1 MG tablet; Take 1 tablet (1 mg total) by mouth 2 (two) times daily as needed.  Left sciatic nerve pain -     gabapentin  (NEURONTIN ) 300 MG capsule; Take 2 capsules (600 mg total) by mouth 2 (two) times daily.  Screen for colon cancer -     Ambulatory referral to Gastroenterology  Other orders -     PARoxetine  (PAXIL ) 20 MG tablet; Take 1 tablet (20 mg total) by mouth every morning. -     rosuvastatin  (CRESTOR ) 10 MG tablet; Take 1 tablet (10 mg total) by mouth daily. -     Semaglutide , 1 MG/DOSE, (OZEMPIC , 1 MG/DOSE,) 4 MG/3ML SOPN; Inject 1 mg into the skin once a week.   I have changed Shontell R. Oliphant's PARoxetine . I am also having her maintain her blood glucose meter kit and supplies, ferrous sulfate , aspirin  EC, cyanocobalamin , nitroGLYCERIN , meclizine , amLODipine , celecoxib , metoprolol  tartrate, omeprazole , ALPRAZolam , gabapentin , rosuvastatin , and Ozempic  (1 MG/DOSE).  Meds ordered this encounter  Medications   ALPRAZolam  (XANAX ) 1 MG tablet    Sig: Take 1 tablet (1 mg total) by mouth 2 (two) times daily as needed.    Dispense:  180 tablet    Refill:  1   gabapentin  (NEURONTIN ) 300 MG capsule    Sig: Take 2 capsules (600 mg total) by mouth 2 (two) times daily.    Dispense:  360 capsule    Refill:  3   PARoxetine  (PAXIL ) 20 MG tablet    Sig: Take 1 tablet (20 mg total) by mouth every morning.    Dispense:  90 tablet     Refill:  1   rosuvastatin  (CRESTOR ) 10 MG tablet    Sig: Take 1 tablet (10 mg total) by mouth daily.    Dispense:  90 tablet    Refill:  1   Semaglutide , 1 MG/DOSE, (OZEMPIC , 1 MG/DOSE,) 4 MG/3ML SOPN    Sig: Inject 1 mg into the skin once a week.    Dispense:  12 mL    Refill:  1    E11.9     Follow-up: Return in about 3 months (around 06/09/2023).  Butler Der, M.D.

## 2023-03-12 LAB — CMP14+EGFR
ALT: 14 [IU]/L (ref 0–32)
AST: 15 [IU]/L (ref 0–40)
Albumin: 4.3 g/dL (ref 3.9–4.9)
Alkaline Phosphatase: 71 [IU]/L (ref 44–121)
BUN/Creatinine Ratio: 16 (ref 12–28)
BUN: 13 mg/dL (ref 8–27)
Bilirubin Total: 1.1 mg/dL (ref 0.0–1.2)
CO2: 26 mmol/L (ref 20–29)
Calcium: 9.4 mg/dL (ref 8.7–10.3)
Chloride: 101 mmol/L (ref 96–106)
Creatinine, Ser: 0.79 mg/dL (ref 0.57–1.00)
Globulin, Total: 2 g/dL (ref 1.5–4.5)
Glucose: 178 mg/dL — ABNORMAL HIGH (ref 70–99)
Potassium: 3.9 mmol/L (ref 3.5–5.2)
Sodium: 141 mmol/L (ref 134–144)
Total Protein: 6.3 g/dL (ref 6.0–8.5)
eGFR: 85 mL/min/{1.73_m2} (ref >59)

## 2023-03-12 LAB — CBC WITH DIFFERENTIAL/PLATELET
Basophils Absolute: 0 10*3/uL (ref 0.0–0.2)
Basos: 1 %
EOS (ABSOLUTE): 0.3 10*3/uL (ref 0.0–0.4)
Eos: 4 %
Hematocrit: 46.2 % (ref 34.0–46.6)
Hemoglobin: 15.4 g/dL (ref 11.1–15.9)
Immature Grans (Abs): 0 10*3/uL (ref 0.0–0.1)
Immature Granulocytes: 0 %
Lymphocytes Absolute: 1.7 10*3/uL (ref 0.7–3.1)
Lymphs: 29 %
MCH: 32.7 pg (ref 26.6–33.0)
MCHC: 33.3 g/dL (ref 31.5–35.7)
MCV: 98 fL — ABNORMAL HIGH (ref 79–97)
Monocytes Absolute: 0.3 10*3/uL (ref 0.1–0.9)
Monocytes: 5 %
Neutrophils Absolute: 3.6 10*3/uL (ref 1.4–7.0)
Neutrophils: 61 %
Platelets: 192 10*3/uL (ref 150–450)
RBC: 4.71 x10E6/uL (ref 3.77–5.28)
RDW: 12.4 % (ref 11.7–15.4)
WBC: 5.9 10*3/uL (ref 3.4–10.8)

## 2023-03-12 LAB — LIPID PANEL
Chol/HDL Ratio: 2.9 {ratio} (ref 0.0–4.4)
Cholesterol, Total: 146 mg/dL (ref 100–199)
HDL: 50 mg/dL (ref >39)
LDL Chol Calc (NIH): 69 mg/dL (ref 0–99)
Triglycerides: 158 mg/dL — ABNORMAL HIGH (ref 0–149)
VLDL Cholesterol Cal: 27 mg/dL (ref 5–40)

## 2023-03-13 ENCOUNTER — Encounter: Payer: Self-pay | Admitting: *Deleted

## 2023-03-13 LAB — TOXASSURE SELECT 13 (MW), URINE

## 2023-03-17 NOTE — Progress Notes (Signed)
Hello Caasi,  Your lab result is normal and/or stable.Some minor variations that are not significant are commonly marked abnormal, but do not represent any medical problem for you.  Best regards, Oshen Wlodarczyk, M.D.

## 2023-03-18 ENCOUNTER — Ambulatory Visit: Admitting: Family Medicine

## 2023-06-09 ENCOUNTER — Ambulatory Visit: Admitting: Family Medicine

## 2023-06-16 ENCOUNTER — Ambulatory Visit: Admitting: Family Medicine

## 2023-06-16 ENCOUNTER — Ambulatory Visit
Admission: RE | Admit: 2023-06-16 | Discharge: 2023-06-16 | Disposition: A | Discharge status: Home / Self Care | Source: Ambulatory Visit | Attending: Family Medicine | Admitting: Family Medicine

## 2023-06-16 ENCOUNTER — Other Ambulatory Visit: Payer: Self-pay | Admitting: Family Medicine

## 2023-06-16 VITALS — BP 110/74 | HR 91 | Temp 98.2°F | Ht 63.0 in | Wt 187.0 lb

## 2023-06-16 DIAGNOSIS — K029 Dental caries, unspecified: Secondary | ICD-10-CM

## 2023-06-16 DIAGNOSIS — I10 Essential (primary) hypertension: Secondary | ICD-10-CM | POA: Diagnosis not present

## 2023-06-16 DIAGNOSIS — I959 Hypotension, unspecified: Secondary | ICD-10-CM

## 2023-06-16 DIAGNOSIS — E782 Mixed hyperlipidemia: Secondary | ICD-10-CM | POA: Diagnosis not present

## 2023-06-16 DIAGNOSIS — E1165 Type 2 diabetes mellitus with hyperglycemia: Secondary | ICD-10-CM | POA: Diagnosis not present

## 2023-06-16 DIAGNOSIS — Z1231 Encounter for screening mammogram for malignant neoplasm of breast: Secondary | ICD-10-CM

## 2023-06-16 DIAGNOSIS — R17 Unspecified jaundice: Secondary | ICD-10-CM

## 2023-06-16 DIAGNOSIS — N179 Acute kidney failure, unspecified: Secondary | ICD-10-CM | POA: Diagnosis not present

## 2023-06-16 DIAGNOSIS — I2510 Atherosclerotic heart disease of native coronary artery without angina pectoris: Secondary | ICD-10-CM

## 2023-06-16 DIAGNOSIS — Z7985 Long-term (current) use of injectable non-insulin antidiabetic drugs: Secondary | ICD-10-CM

## 2023-06-16 LAB — LIPID PANEL

## 2023-06-16 LAB — BAYER DCA HB A1C WAIVED: HB A1C (BAYER DCA - WAIVED): 7.7 % — ABNORMAL HIGH (ref 4.8–5.6)

## 2023-06-16 MED ORDER — AMLODIPINE BESYLATE 5 MG PO TABS: 5.0000 mg | ORAL_TABLET | Freq: Every day | ORAL | 3 refills | Status: DC

## 2023-06-16 MED ORDER — PAROXETINE HCL 20 MG PO TABS: 20.0000 mg | ORAL_TABLET | Freq: Every morning | ORAL | 1 refills | Status: AC

## 2023-06-16 MED ORDER — AMOXICILLIN 500 MG PO CAPS: 500.0000 mg | ORAL_CAPSULE | Freq: Three times a day (TID) | ORAL | 0 refills | Status: AC

## 2023-06-16 MED ORDER — ROSUVASTATIN CALCIUM 10 MG PO TABS: 10.0000 mg | ORAL_TABLET | Freq: Every day | ORAL | 1 refills | Status: DC

## 2023-06-16 MED ORDER — OZEMPIC (1 MG/DOSE) 4 MG/3ML ~~LOC~~ SOPN: 1.0000 mg | PEN_INJECTOR | SUBCUTANEOUS | 1 refills | Status: DC

## 2023-06-16 NOTE — Progress Notes (Signed)
 Subjective:  Patient ID: Natalie Tanner,  female    DOB: 12/20/1960  Age: 63 y.o.    CC: Dental Injury (Would like abx to hold her over until she sees dentist on 28th. Broke tooth. Swelling. )   HPI Natalie Tanner presents for  follow-up of hypertension. Patient has no history of headache chest pain or shortness of breath or recent cough. Patient also denies symptoms of TIA such as numbness weakness lateralizing. Patient denies side effects from medication. States taking it regularly.  Patient also  in for follow-up of elevated cholesterol. Doing well without complaints on current medication. Denies side effects  including myalgia and arthralgia and nausea. Also in today for liver function testing. Currently no chest pain, shortness of breath or other cardiovascular related symptoms noted.  Follow-up of diabetes. Patient denies symptoms such as excessive hunger or urinary frequency, excessive hunger, nausea No significant hypoglycemic spells noted. Medications reviewed. Pt reports taking them regularly. Pt. denies complication/adverse reaction today.    History Natalie Tanner has a past medical history of Acute renal failure (ARF) (HCC), Anxiety, Coronary atherosclerosis of native coronary artery, Depression, Essential hypertension, GERD (gastroesophageal reflux disease), History of kidney stones, Iron deficiency anemia, Mixed hyperlipidemia, Myocardial infarction [facility]), Nephrolithiasis, Stroke (HCC) (2017), and Type 2 diabetes mellitus (HCC).   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   Her family history includes Other in her father; Stroke in her father.She reports that she quit smoking about 22 years ago. Her smoking use included cigarettes. She started smoking about 42 years ago. She has a 20 pack-year smoking history. She has never used smokeless tobacco. She reports that  she does not drink alcohol and does not use drugs.  Current Outpatient Medications on File Prior to Visit  Medication Sig Dispense Refill   ALPRAZolam (XANAX) 1 MG tablet Take 1 tablet (1 mg total) by mouth 2 (two) times daily as needed. 180 tablet 1   aspirin EC 81 MG tablet Take 81 mg by mouth daily. Swallow whole.     blood glucose meter kit and supplies KIT Dispense based on patient and insurance preference. Use up to four times daily as directed. For diabetes E11.9, with hyperglycemia. 90 each 11   celecoxib (CELEBREX) 200 MG capsule Take 1 capsule (200 mg total) by mouth daily. with food 90 capsule 3   ferrous sulfate 325 (65 FE) MG tablet Take 1 tablet (325 mg total) by mouth 3 (three) times daily after meals. 90 tablet 1   gabapentin (NEURONTIN) 300 MG capsule Take 2 capsules (600 mg total) by mouth 2 (two) times daily. 360 capsule 3   meclizine (ANTIVERT) 50 MG tablet TAKE ONE TABLET BY MOUTH BID AS NEEDED (CHANGE DOSE TO MAXIMUM DOSE RECOMMENDATION) 60 tablet 1   metoprolol tartrate (LOPRESSOR) 25 MG tablet Take 0.5 tablets (12.5 mg total) by mouth 2 (two) times daily. 90 tablet 3   nitroGLYCERIN (NITROSTAT) 0.4 MG SL tablet Place 1 tablet (0.4 mg total) under the tongue every 5 (five) minutes as needed for chest pain. 75 tablet 3   omeprazole (PRILOSEC OTC) 20 MG tablet Take 1 tablet (20 mg total) by mouth daily. 90 tablet 3   cyanocobalamin (,VITAMIN B-12,) 1000 MCG/ML injection Inject 1 mL (1,000 mcg total) into the skin every 30 (thirty) days. (Patient not taking: Reported on 06/16/2023) 1 mL prn   No current facility-administered medications on file prior  to visit.    ROS Review of Systems  Constitutional: Negative.   HENT:  Positive for dental problem (right upper toothe fell out a few days ago. Right face swollen.).   Eyes:  Negative for visual disturbance.  Respiratory:  Negative for shortness of breath.   Cardiovascular:  Negative for chest pain.  Gastrointestinal:   Negative for abdominal pain.  Musculoskeletal:  Negative for arthralgias.    Objective:  BP 110/74   Pulse 91   Temp 98.2 F (36.8 C)   Ht 5\' 3"  (1.6 m)   Wt 187 lb (84.8 kg)   LMP 07/05/2010   SpO2 96%   BMI 33.13 kg/m   BP Readings from Last 3 Encounters:  06/16/23 110/74  03/11/23 129/86  12/17/22 131/79    Wt Readings from Last 3 Encounters:  06/16/23 187 lb (84.8 kg)  03/11/23 197 lb 9.6 oz (89.6 kg)  12/17/22 205 lb (93 kg)    Lab Results  Component Value Date   HGBA1C 7.1 (H) 03/11/2023   HGBA1C 6.9 (H) 12/17/2022   HGBA1C 6.7 (H) 08/05/2022    Physical Exam Constitutional:      General: She is not in acute distress.    Appearance: She is well-developed.  HENT:     Head:     Comments: The right cheek is swollen compared to the left.  In the mouth, right upper gum, There is a open crevice where the tooth fell out looks like it may have been the bicuspid as opposed to the first molar it is hard to tell due to lack of landmarks. Cardiovascular:     Rate and Rhythm: Normal rate and regular rhythm.  Pulmonary:     Breath sounds: Normal breath sounds.  Musculoskeletal:        General: Normal range of motion.  Skin:    General: Skin is warm and dry.  Neurological:     Mental Status: She is alert and oriented to person, place, and time.         Assessment & Plan:  Essential hypertension, benign -     CBC with Differential/Platelet  Mixed hyperlipidemia -     Lipid panel  Type 2 diabetes mellitus with hyperglycemia, without long-term current use of insulin (HCC) -     Bayer DCA Hb A1c Waived -     Lipid panel  AKI (acute kidney injury) (HCC) -     Bayer DCA Hb A1c Waived -     CBC with Differential/Platelet -     CMP14+EGFR  Hypotension, unspecified hypotension type  Atherosclerosis of native coronary artery of native heart without angina pectoris -     CBC with Differential/Platelet  Dental caries  Other orders -     amLODIPine  Besylate; Take 1 tablet (5 mg total) by mouth daily. For blood pressure  Dispense: 90 tablet; Refill: 3 -     PARoxetine HCl; Take 1 tablet (20 mg total) by mouth every morning.  Dispense: 90 tablet; Refill: 1 -     Rosuvastatin Calcium; Take 1 tablet (10 mg total) by mouth daily.  Dispense: 90 tablet; Refill: 1 -     Ozempic (1 MG/DOSE); Inject 1 mg into the skin once a week.  Dispense: 12 mL; Refill: 1 -     Amoxicillin; Take 1 capsule (500 mg total) by mouth 3 (three) times daily for 14 days.  Dispense: 42 capsule; Refill: 0    Follow-up: No follow-ups on file.  Natalie Tanner, M.D.

## 2023-06-17 LAB — CBC WITH DIFFERENTIAL/PLATELET
Basophils Absolute: 0 10*3/uL (ref 0.0–0.2)
Basos: 0 %
EOS (ABSOLUTE): 0 10*3/uL (ref 0.0–0.4)
Eos: 0 %
Hematocrit: 49.8 % — ABNORMAL HIGH (ref 34.0–46.6)
Hemoglobin: 16.8 g/dL — ABNORMAL HIGH (ref 11.1–15.9)
Immature Grans (Abs): 0 10*3/uL (ref 0.0–0.1)
Immature Granulocytes: 0 %
Lymphocytes Absolute: 1.4 10*3/uL (ref 0.7–3.1)
Lymphs: 12 %
MCH: 32.9 pg (ref 26.6–33.0)
MCHC: 33.7 g/dL (ref 31.5–35.7)
MCV: 98 fL — ABNORMAL HIGH (ref 79–97)
Monocytes Absolute: 0.7 10*3/uL (ref 0.1–0.9)
Monocytes: 6 %
Neutrophils Absolute: 9.4 10*3/uL — ABNORMAL HIGH (ref 1.4–7.0)
Neutrophils: 82 %
Platelets: 241 10*3/uL (ref 150–450)
RBC: 5.11 x10E6/uL (ref 3.77–5.28)
RDW: 11.7 % (ref 11.7–15.4)
WBC: 11.6 10*3/uL — ABNORMAL HIGH (ref 3.4–10.8)

## 2023-06-17 LAB — CMP14+EGFR
ALT: 14 IU/L (ref 0–32)
AST: 13 IU/L (ref 0–40)
Albumin: 4.5 g/dL (ref 3.9–4.9)
Alkaline Phosphatase: 77 IU/L (ref 44–121)
BUN/Creatinine Ratio: 11 — ABNORMAL LOW (ref 12–28)
BUN: 14 mg/dL (ref 8–27)
Bilirubin Total: 2.5 mg/dL — ABNORMAL HIGH (ref 0.0–1.2)
CO2: 24 mmol/L (ref 20–29)
Calcium: 9.8 mg/dL (ref 8.7–10.3)
Chloride: 95 mmol/L — ABNORMAL LOW (ref 96–106)
Creatinine, Ser: 1.23 mg/dL — ABNORMAL HIGH (ref 0.57–1.00)
Globulin, Total: 2.7 g/dL (ref 1.5–4.5)
Glucose: 336 mg/dL — ABNORMAL HIGH (ref 70–99)
Potassium: 5 mmol/L (ref 3.5–5.2)
Sodium: 136 mmol/L (ref 134–144)
Total Protein: 7.2 g/dL (ref 6.0–8.5)
eGFR: 50 mL/min/{1.73_m2} — ABNORMAL LOW (ref >59)

## 2023-06-17 LAB — LIPID PANEL
Cholesterol, Total: 154 mg/dL (ref 100–199)
HDL: 56 mg/dL (ref >39)
LDL CALC COMMENT:: 2.8 ratio (ref 0.0–4.4)
LDL Chol Calc (NIH): 78 mg/dL (ref 0–99)
Triglycerides: 112 mg/dL (ref 0–149)
VLDL Cholesterol Cal: 20 mg/dL (ref 5–40)

## 2023-06-20 ENCOUNTER — Other Ambulatory Visit: Payer: Self-pay | Admitting: Family Medicine

## 2023-06-20 DIAGNOSIS — R928 Other abnormal and inconclusive findings on diagnostic imaging of breast: Secondary | ICD-10-CM

## 2023-06-30 NOTE — Addendum Note (Signed)
 Addended by: Michaela Adie B on: 06/30/2023 09:03 AM   Modules accepted: Orders

## 2023-07-01 ENCOUNTER — Other Ambulatory Visit

## 2023-07-01 DIAGNOSIS — E1165 Type 2 diabetes mellitus with hyperglycemia: Secondary | ICD-10-CM

## 2023-07-01 DIAGNOSIS — R17 Unspecified jaundice: Secondary | ICD-10-CM

## 2023-07-02 LAB — ACUTE HEP PANEL AND HEP B SURFACE AB
Hep A IgM: NEGATIVE
Hep B C IgM: NEGATIVE
Hep C Virus Ab: NONREACTIVE
Hepatitis B Surf Ab Quant: <3.5 m[IU]/mL — ABNORMAL LOW
Hepatitis B Surface Ag: NEGATIVE

## 2023-07-02 LAB — CMP14+EGFR
ALT: 18 IU/L (ref 0–32)
AST: 19 IU/L (ref 0–40)
Albumin: 4 g/dL (ref 3.9–4.9)
Alkaline Phosphatase: 65 IU/L (ref 44–121)
BUN/Creatinine Ratio: 11 — ABNORMAL LOW (ref 12–28)
BUN: 9 mg/dL (ref 8–27)
Bilirubin Total: 1 mg/dL (ref 0.0–1.2)
CO2: 24 mmol/L (ref 20–29)
Calcium: 8.9 mg/dL (ref 8.7–10.3)
Chloride: 105 mmol/L (ref 96–106)
Creatinine, Ser: 0.85 mg/dL (ref 0.57–1.00)
Globulin, Total: 2.2 g/dL (ref 1.5–4.5)
Glucose: 198 mg/dL — ABNORMAL HIGH (ref 70–99)
Potassium: 4 mmol/L (ref 3.5–5.2)
Sodium: 143 mmol/L (ref 134–144)
Total Protein: 6.2 g/dL (ref 6.0–8.5)
eGFR: 77 mL/min/{1.73_m2} (ref >59)

## 2023-07-06 ENCOUNTER — Encounter: Payer: Self-pay | Admitting: Family Medicine

## 2023-07-07 ENCOUNTER — Other Ambulatory Visit

## 2023-07-07 ENCOUNTER — Encounter

## 2023-09-01 ENCOUNTER — Ambulatory Visit

## 2023-09-01 DIAGNOSIS — E1159 Type 2 diabetes mellitus with other circulatory complications: Secondary | ICD-10-CM

## 2023-09-01 LAB — HM DIABETES EYE EXAM

## 2023-09-01 NOTE — Progress Notes (Signed)
 Natalie Tanner arrived 09/01/2023 and has given verbal consent to obtain images and complete their overdue diabetic retinal screening.  The images have been sent to an ophthalmologist or optometrist for review and interpretation.  Results will be sent back to Zollie Lowers, MD for review.  Patient has been informed they will be contacted when we receive the results via telephone or MyChart

## 2023-09-11 ENCOUNTER — Encounter (INDEPENDENT_AMBULATORY_CARE_PROVIDER_SITE_OTHER): Payer: Self-pay | Admitting: *Deleted

## 2023-09-15 ENCOUNTER — Ambulatory Visit: Admitting: Family Medicine

## 2023-09-15 VITALS — BP 113/72 | HR 66 | Temp 98.1°F | Ht 63.0 in

## 2023-09-15 DIAGNOSIS — M25561 Pain in right knee: Secondary | ICD-10-CM | POA: Diagnosis not present

## 2023-09-15 DIAGNOSIS — E782 Mixed hyperlipidemia: Secondary | ICD-10-CM | POA: Diagnosis not present

## 2023-09-15 DIAGNOSIS — I2510 Atherosclerotic heart disease of native coronary artery without angina pectoris: Secondary | ICD-10-CM

## 2023-09-15 DIAGNOSIS — I10 Essential (primary) hypertension: Secondary | ICD-10-CM

## 2023-09-15 DIAGNOSIS — E1159 Type 2 diabetes mellitus with other circulatory complications: Secondary | ICD-10-CM

## 2023-09-15 DIAGNOSIS — M150 Primary generalized (osteo)arthritis: Secondary | ICD-10-CM

## 2023-09-15 DIAGNOSIS — G8929 Other chronic pain: Secondary | ICD-10-CM

## 2023-09-15 DIAGNOSIS — F419 Anxiety disorder, unspecified: Secondary | ICD-10-CM

## 2023-09-15 LAB — BAYER DCA HB A1C WAIVED: HB A1C (BAYER DCA - WAIVED): 7.3 % — ABNORMAL HIGH (ref 4.8–5.6)

## 2023-09-15 LAB — LIPID PANEL

## 2023-09-15 MED ORDER — METOPROLOL TARTRATE 25 MG PO TABS: 12.5000 mg | ORAL_TABLET | Freq: Two times a day (BID) | ORAL | 3 refills | Status: AC

## 2023-09-15 MED ORDER — OZEMPIC (1 MG/DOSE) 4 MG/3ML ~~LOC~~ SOPN: 1.0000 mg | PEN_INJECTOR | SUBCUTANEOUS | 1 refills | Status: DC

## 2023-09-15 MED ORDER — CELECOXIB 200 MG PO CAPS: 200.0000 mg | ORAL_CAPSULE | Freq: Every day | ORAL | 3 refills | Status: AC

## 2023-09-15 MED ORDER — ALPRAZOLAM 1 MG PO TABS: 1.0000 mg | ORAL_TABLET | Freq: Two times a day (BID) | ORAL | 1 refills | Status: AC | PRN

## 2023-09-15 MED ORDER — OMEPRAZOLE MAGNESIUM 20 MG PO TBEC: 20.0000 mg | DELAYED_RELEASE_TABLET | Freq: Every day | ORAL | 3 refills | Status: AC

## 2023-09-15 NOTE — Progress Notes (Signed)
 Subjective:  Patient ID: Natalie Tanner,  female    DOB: Jun 22, 1960  Age: 63 y.o.    CC: Diabetes   HPI Natalie Tanner presents for  follow-up of hypertension. Patient has no history of headache chest pain or shortness of breath or recent cough. Patient also denies symptoms of TIA such as numbness weakness lateralizing. Patient denies side effects from medication. States taking it regularly.  Patient also  in for follow-up of elevated cholesterol. Doing well without complaints on current medication. Denies side effects  including myalgia and arthralgia and nausea. Also in today for liver function testing. Currently no chest pain, shortness of breath or other cardiovascular related symptoms noted.  Follow-up of diabetes. Patient does not check blood sugar at home.  Patient denies symptoms such as excessive hunger or urinary frequency, excessive hunger, nausea No significant hypoglycemic spells noted. Medications reviewed. Pt reports taking them regularly. Pt. denies complication/adverse reaction today.    History Theo has a past medical history of Acute renal failure (ARF) (HCC), Anxiety, Coronary atherosclerosis of native coronary artery, Depression, Essential hypertension, GERD (gastroesophageal reflux disease), History of kidney stones, Iron deficiency anemia, Mixed hyperlipidemia, Myocardial infarction [facility]), Nephrolithiasis, Stroke (HCC) (2017), and Type 2 diabetes mellitus (HCC).   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   Her family history includes Other in her father; Stroke in her father.She reports that she quit smoking about 22 years ago. Her smoking use included cigarettes. She started smoking about 42 years ago. She has a 20 pack-year smoking history. She has never used smokeless tobacco. She reports that she does not drink alcohol and does not use  drugs.  Current Outpatient Medications on File Prior to Visit  Medication Sig Dispense Refill   amLODipine  (NORVASC ) 5 MG tablet Take 1 tablet (5 mg total) by mouth daily. For blood pressure 90 tablet 3   aspirin  EC 81 MG tablet Take 81 mg by mouth daily. Swallow whole.     cyanocobalamin  (,VITAMIN B-12,) 1000 MCG/ML injection Inject 1 mL (1,000 mcg total) into the skin every 30 (thirty) days. 1 mL prn   ferrous sulfate  325 (65 FE) MG tablet Take 1 tablet (325 mg total) by mouth 3 (three) times daily after meals. 90 tablet 1   gabapentin  (NEURONTIN ) 300 MG capsule Take 2 capsules (600 mg total) by mouth 2 (two) times daily. 360 capsule 3   meclizine  (ANTIVERT ) 50 MG tablet TAKE ONE TABLET BY MOUTH BID AS NEEDED (CHANGE DOSE TO MAXIMUM DOSE RECOMMENDATION) 60 tablet 1   nitroGLYCERIN  (NITROSTAT ) 0.4 MG SL tablet Place 1 tablet (0.4 mg total) under the tongue every 5 (five) minutes as needed for chest pain. 75 tablet 3   PARoxetine  (PAXIL ) 20 MG tablet Take 1 tablet (20 mg total) by mouth every morning. 90 tablet 1   rosuvastatin  (CRESTOR ) 10 MG tablet Take 1 tablet (10 mg total) by mouth daily. 90 tablet 1   blood glucose meter kit and supplies KIT Dispense based on patient and insurance preference. Use up to four times daily as directed. For diabetes E11.9, with hyperglycemia. 90 each 11   No current facility-administered medications on file prior to visit.    ROS Review of Systems  Constitutional: Negative.   HENT: Negative.    Eyes:  Negative for visual disturbance.  Respiratory:  Negative for shortness of breath.   Cardiovascular:  Negative for chest pain.  Gastrointestinal:  Negative for abdominal pain.  Musculoskeletal:  Negative for arthralgias.    Objective:  BP 113/72   Pulse 66   Temp 98.1 F (36.7 C)   Ht 5' 3 (1.6 m)   LMP 07/05/2010   SpO2 97%   BMI 33.13 kg/m   BP Readings from Last 3 Encounters:  09/15/23 113/72  06/16/23 110/74  03/11/23 129/86    Wt  Readings from Last 3 Encounters:  06/16/23 187 lb (84.8 kg)  03/11/23 197 lb 9.6 oz (89.6 kg)  12/17/22 205 lb (93 kg)    Lab Results  Component Value Tanner   HGBA1C 7.3 (H) 09/15/2023   HGBA1C 7.7 (H) 06/16/2023   HGBA1C 7.1 (H) 03/11/2023    Physical Exam Constitutional:      General: She is not in acute distress.    Appearance: She is well-developed.  Cardiovascular:     Rate and Rhythm: Normal rate and regular rhythm.  Pulmonary:     Breath sounds: Normal breath sounds.  Musculoskeletal:        General: Normal range of motion.  Skin:    General: Skin is warm and dry.  Neurological:     Mental Status: She is alert and oriented to person, place, and time.         Assessment & Plan:  Type 2 diabetes mellitus with vascular disease (HCC) -     Bayer DCA Hb A1c Waived  Chronic pain of right knee -     Bayer DCA Hb A1c Waived  Mixed hyperlipidemia -     CMP14+EGFR -     Lipid panel  Essential hypertension, benign -     CBC with Differential/Platelet -     Metoprolol  Tartrate; Take 0.5 tablets (12.5 mg total) by mouth 2 (two) times daily.  Dispense: 90 tablet; Refill: 3  Atherosclerosis of native coronary artery of native heart without angina pectoris -     CBC with Differential/Platelet -     CMP14+EGFR -     Lipid panel  Primary osteoarthritis involving multiple joints -     Celecoxib ; Take 1 capsule (200 mg total) by mouth daily. with food  Dispense: 90 capsule; Refill: 3  Anxiety -     ALPRAZolam ; Take 1 tablet (1 mg total) by mouth 2 (two) times daily as needed.  Dispense: 180 tablet; Refill: 1  Other orders -     Ozempic  (1 MG/DOSE); Inject 1 mg into the skin once a week.  Dispense: 12 mL; Refill: 1 -     Omeprazole  Magnesium ; Take 1 tablet (20 mg total) by mouth daily.  Dispense: 90 tablet; Refill: 3    Follow-up: No follow-ups on file.  Butler Der, M.D.

## 2023-09-16 ENCOUNTER — Ambulatory Visit: Payer: Self-pay | Admitting: Family Medicine

## 2023-09-16 LAB — CBC WITH DIFFERENTIAL/PLATELET
Basophils Absolute: 0 x10E3/uL (ref 0.0–0.2)
Basos: 0 %
EOS (ABSOLUTE): 0.3 x10E3/uL (ref 0.0–0.4)
Eos: 4 %
Hematocrit: 44.1 % (ref 34.0–46.6)
Hemoglobin: 14.6 g/dL (ref 11.1–15.9)
Immature Grans (Abs): 0 x10E3/uL (ref 0.0–0.1)
Immature Granulocytes: 0 %
Lymphocytes Absolute: 2.3 x10E3/uL (ref 0.7–3.1)
Lymphs: 32 %
MCH: 32.2 pg (ref 26.6–33.0)
MCHC: 33.1 g/dL (ref 31.5–35.7)
MCV: 97 fL (ref 79–97)
Monocytes Absolute: 0.4 x10E3/uL (ref 0.1–0.9)
Monocytes: 5 %
Neutrophils Absolute: 4.1 x10E3/uL (ref 1.4–7.0)
Neutrophils: 59 %
Platelets: 199 x10E3/uL (ref 150–450)
RBC: 4.53 x10E6/uL (ref 3.77–5.28)
RDW: 12 % (ref 11.7–15.4)
WBC: 7.1 x10E3/uL (ref 3.4–10.8)

## 2023-09-16 LAB — CMP14+EGFR
ALT: 13 IU/L (ref 0–32)
AST: 15 IU/L (ref 0–40)
Albumin: 4.1 g/dL (ref 3.9–4.9)
Alkaline Phosphatase: 64 IU/L (ref 44–121)
BUN/Creatinine Ratio: 13 (ref 12–28)
BUN: 10 mg/dL (ref 8–27)
Bilirubin Total: 0.9 mg/dL (ref 0.0–1.2)
CO2: 26 mmol/L (ref 20–29)
Calcium: 9 mg/dL (ref 8.7–10.3)
Chloride: 100 mmol/L (ref 96–106)
Creatinine, Ser: 0.79 mg/dL (ref 0.57–1.00)
Globulin, Total: 2.3 g/dL (ref 1.5–4.5)
Glucose: 145 mg/dL — AB (ref 70–99)
Potassium: 3.6 mmol/L (ref 3.5–5.2)
Sodium: 141 mmol/L (ref 134–144)
Total Protein: 6.4 g/dL (ref 6.0–8.5)
eGFR: 85 mL/min/1.73 (ref >59)

## 2023-09-16 LAB — LIPID PANEL
Cholesterol, Total: 138 mg/dL (ref 100–199)
HDL: 47 mg/dL (ref >39)
LDL CALC COMMENT:: 2.9 ratio (ref 0.0–4.4)
LDL Chol Calc (NIH): 70 mg/dL (ref 0–99)
Triglycerides: 119 mg/dL (ref 0–149)
VLDL Cholesterol Cal: 21 mg/dL (ref 5–40)

## 2023-09-16 NOTE — Progress Notes (Signed)
Hello Caasi,  Your lab result is normal and/or stable.Some minor variations that are not significant are commonly marked abnormal, but do not represent any medical problem for you.  Best regards, Oshen Wlodarczyk, M.D.

## 2023-12-20 LAB — OPHTHALMOLOGY REPORT-SCANNED

## 2023-12-22 ENCOUNTER — Encounter: Payer: Self-pay | Admitting: Family Medicine

## 2023-12-22 ENCOUNTER — Ambulatory Visit (INDEPENDENT_AMBULATORY_CARE_PROVIDER_SITE_OTHER): Admitting: Family Medicine

## 2023-12-22 VITALS — BP 124/79 | HR 68 | Temp 98.5°F | Ht 65.0 in | Wt 189.4 lb

## 2023-12-22 DIAGNOSIS — E782 Mixed hyperlipidemia: Secondary | ICD-10-CM | POA: Diagnosis not present

## 2023-12-22 DIAGNOSIS — I10 Essential (primary) hypertension: Secondary | ICD-10-CM | POA: Diagnosis not present

## 2023-12-22 DIAGNOSIS — E1159 Type 2 diabetes mellitus with other circulatory complications: Secondary | ICD-10-CM

## 2023-12-22 DIAGNOSIS — Z7985 Long-term (current) use of injectable non-insulin antidiabetic drugs: Secondary | ICD-10-CM

## 2023-12-22 DIAGNOSIS — M1711 Unilateral primary osteoarthritis, right knee: Secondary | ICD-10-CM | POA: Diagnosis not present

## 2023-12-22 DIAGNOSIS — E119 Type 2 diabetes mellitus without complications: Secondary | ICD-10-CM

## 2023-12-22 LAB — BAYER DCA HB A1C WAIVED: HB A1C (BAYER DCA - WAIVED): 6.9 % — ABNORMAL HIGH (ref 4.8–5.6)

## 2023-12-22 MED ORDER — SEMAGLUTIDE (2 MG/DOSE) 8 MG/3ML ~~LOC~~ SOPN: 2.0000 mg | PEN_INJECTOR | SUBCUTANEOUS | 3 refills | Status: AC

## 2023-12-22 NOTE — Progress Notes (Signed)
 "  Subjective:  Patient ID: Natalie Tanner, female    DOB: Jun 18, 1960  Age: 63 y.o. MRN: [id number]  CC: Medical Management of Chronic Issues   HPI  Discussed the use of AI scribe software for clinical note transcription with the patient, who gave verbal consent to proceed.  History of Present Illness Natalie Tanner is a 63 year old female with diabetes who presents for diabetes management and knee pain.  She has not been regularly monitoring her blood glucose levels but recalls a concerning reading of 190 mg/dL. She is currently on a weekly injectable medication at a dose of one milligram, which she tolerates well. Despite a previous weight loss of ten pounds between January and April, her weight has remained stable since April.  She experiences persistent right knee pain, rated as an eight out of ten. The pain is constant and interferes with her daily activities, including work and sleep. A previous injection in the knee did not provide relief. She is hesitant about knee replacement surgery due to her mother's experience of delaying the procedure and becoming unable to undergo it at an advanced age. She is considering another injection to manage the pain, especially with an upcoming cruise. No chest pain, shortness of breath, abdominal discomfort, heartburn, diarrhea, or constipation.     presents for  follow-up of hypertension. Patient has no history of headache chest pain or shortness of breath or recent cough. Patient also denies symptoms of TIA such as focal numbness or weakness. Patient denies side effects from medication. States taking it regularly.   in for follow-up of elevated cholesterol. Doing well without complaints on current medication. Denies side effects of statin including myalgia and arthralgia and nausea. Currently no chest pain, shortness of breath or other cardiovascular related symptoms noted.       12/22/2023    3:27 PM 09/15/2023    3:24 PM 06/16/2023     1:58 PM  Depression screen PHQ 2/9  Decreased Interest 2 1 3   Down, Depressed, Hopeless 1 1 1   PHQ - 2 Score 3 2 4   Altered sleeping 1 1 2   Tired, decreased energy 3 2 3   Change in appetite 1 1 1   Feeling bad or failure about yourself  1 1 3   Trouble concentrating 1 2 3   Moving slowly or fidgety/restless 2 2 0  Suicidal thoughts 0 0 0  PHQ-9 Score 12 11 16   Difficult doing work/chores  Somewhat difficult Somewhat difficult    History Natalie Tanner has a past medical history of Acute renal failure (ARF), Anxiety, Coronary atherosclerosis of native coronary artery, Depression, Essential hypertension, GERD (gastroesophageal reflux disease), History of kidney stones, Iron deficiency anemia, Mixed hyperlipidemia, Myocardial infarction (HCC), Nephrolithiasis, Stroke (HCC) (2017), and Type 2 diabetes mellitus (HCC).   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   Her family history includes Other in her father; Stroke in her father.She reports that she quit smoking about 22 years ago. Her smoking use included cigarettes. She started smoking about 42 years ago. She has a 20 pack-year smoking history. She has never used smokeless tobacco. She reports that she does not drink alcohol and does not use drugs.    ROS Review of Systems  Constitutional: Negative.   HENT: Negative.    Eyes:  Negative for visual disturbance.  Respiratory:  Negative for shortness of breath.   Cardiovascular:  Negative for chest pain.  Gastrointestinal:  Negative for abdominal pain.  Musculoskeletal:  Negative for arthralgias.    Objective:  BP 124/79   Pulse 68   Temp 98.5 F (36.9 C)   Ht 5' 5 (1.651 m)   Wt 189 lb 6.4 oz (85.9 kg)   LMP 07/05/2010   SpO2 96%   BMI 31.52 kg/m   BP Readings from Last 3 Encounters:  12/22/23 124/79  09/15/23 113/72  06/16/23 110/74    Wt Readings from Last 3  Encounters:  12/22/23 189 lb 6.4 oz (85.9 kg)  06/16/23 187 lb (84.8 kg)  03/11/23 197 lb 9.6 oz (89.6 kg)     Physical Exam Constitutional:      General: She is not in acute distress.    Appearance: She is well-developed.  HENT:     Head: Normocephalic and atraumatic.  Eyes:     Conjunctiva/sclera: Conjunctivae normal.     Pupils: Pupils are equal, round, and reactive to light.  Neck:     Thyroid: No thyromegaly.  Cardiovascular:     Rate and Rhythm: Normal rate and regular rhythm.     Heart sounds: Normal heart sounds. No murmur heard. Pulmonary:     Effort: Pulmonary effort is normal. No respiratory distress.     Breath sounds: Normal breath sounds. No wheezing or rales.  Abdominal:     General: Bowel sounds are normal. There is no distension.     Palpations: Abdomen is soft.     Tenderness: There is no abdominal tenderness.  Musculoskeletal:        General: Normal range of motion.     Cervical back: Normal range of motion and neck supple.  Lymphadenopathy:     Cervical: No cervical adenopathy.  Skin:    General: Skin is warm and dry.  Neurological:     Mental Status: She is alert and oriented to person, place, and time.  Psychiatric:        Behavior: Behavior normal.        Thought Content: Thought content normal.        Judgment: Judgment normal.    Physical Exam VITALS: BP- 124/79 MEASUREMENTS: Weight- same as April. GENERAL: Alert, cooperative, well developed, no acute distress. HEENT: Normocephalic, normal oropharynx, moist mucous membranes. CHEST: Clear to auscultation bilaterally, no wheezes, rhonchi, or crackles. CARDIOVASCULAR: Normal heart rate and rhythm, S1 and S2 normal without murmurs. ABDOMEN: Soft, non-tender, non-distended, without organomegaly, normal bowel sounds. EXTREMITIES: No cyanosis or edema. MUSCULOSKELETAL: Right knee injected with Celestone  and Marcaine , prepped with betadine, medial aspect, no complications. NEUROLOGICAL: Cranial  nerves grossly intact, moves all extremities without gross motor or sensory deficit.   Assessment & Plan:  Arthritis of right knee  Type 2 diabetes mellitus with vascular disease (HCC) -     Bayer DCA Hb A1c Waived -     CMP14+EGFR -     Microalbumin / creatinine urine ratio  Essential hypertension, benign -     CBC with Differential/Platelet  Mixed hyperlipidemia -     Lipid panel  Diabetes mellitus treated with injections of non-insulin  medication (HCC)  Other orders -     Semaglutide  (2 MG/DOSE); Inject 2 mg as directed once a week.  Dispense: 9 mL; Refill: 3    Assessment and Plan Assessment & Plan Right knee pain   Chronic right knee pain significantly impacts her quality of life, rated 8/10 on the pain scale. A previous injection was ineffective. She is considering knee replacement but prefers to delay  surgery. An injection was performed today as a temporary measure before her upcoming cruise. Knee replacement outcomes were discussed, noting that average knee replacement results are generally worse than hip replacements, though individual outcomes may vary. Administered a right knee injection with 1.5 mL of Celestone  and 2.5 mL of Marcaine . Consider referral to an orthopedist for further evaluation and potential knee replacement if pain persists.  Type 2 diabetes mellitus   Type 2 diabetes mellitus with suboptimal blood glucose control. Reports a blood glucose level of 190 mg/dL, likely fasting. Current treatment includes weekly injections, but her weight has remained stable since April, suggesting a potential need for adjustment. Awaiting A1c results to further assess glycemic control. Increase weekly injection to the next higher strength to aid in weight reduction and improve glycemic control. Monitor for symptoms of hypoglycemia, including weakness, shakiness, nausea, and hunger. Await A1c results to further evaluate diabetes management.  Essential hypertension   Essential  hypertension is well-controlled with a current blood pressure reading of 124/79 mmHg, within the target range.       Follow-up: Return in about 3 months (around 03/23/2024), or if symptoms worsen or fail to improve, for diabetes, Pain.  Butler Der, M.D. "

## 2023-12-23 ENCOUNTER — Ambulatory Visit: Payer: Self-pay | Admitting: Family Medicine

## 2023-12-23 LAB — CBC WITH DIFFERENTIAL/PLATELET
Basophils Absolute: 0 x10E3/uL (ref 0.0–0.2)
Basos: 1 %
EOS (ABSOLUTE): 0.2 x10E3/uL (ref 0.0–0.4)
Eos: 3 %
Hematocrit: 47.4 % — ABNORMAL HIGH (ref 34.0–46.6)
Hemoglobin: 16.2 g/dL — ABNORMAL HIGH (ref 11.1–15.9)
Immature Grans (Abs): 0 x10E3/uL (ref 0.0–0.1)
Immature Granulocytes: 0 %
Lymphocytes Absolute: 1.7 x10E3/uL (ref 0.7–3.1)
Lymphs: 30 %
MCH: 33.3 pg — ABNORMAL HIGH (ref 26.6–33.0)
MCHC: 34.2 g/dL (ref 31.5–35.7)
MCV: 97 fL (ref 79–97)
Monocytes Absolute: 0.3 x10E3/uL (ref 0.1–0.9)
Monocytes: 5 %
Neutrophils Absolute: 3.5 x10E3/uL (ref 1.4–7.0)
Neutrophils: 61 %
Platelets: 210 x10E3/uL (ref 150–450)
RBC: 4.87 x10E6/uL (ref 3.77–5.28)
RDW: 12.1 % (ref 11.7–15.4)
WBC: 5.7 x10E3/uL (ref 3.4–10.8)

## 2023-12-23 LAB — MICROALBUMIN / CREATININE URINE RATIO
Creatinine, Urine: 32.4 mg/dL
Microalb/Creat Ratio: 19 mg/g{creat} (ref 0–29)
Microalbumin, Urine: 6.3 ug/mL

## 2023-12-23 LAB — CMP14+EGFR
ALT: 23 IU/L (ref 0–32)
AST: 26 IU/L (ref 0–40)
Albumin: 4.4 g/dL (ref 3.9–4.9)
Alkaline Phosphatase: 71 IU/L (ref 49–135)
BUN/Creatinine Ratio: 8 — ABNORMAL LOW (ref 12–28)
BUN: 7 mg/dL — ABNORMAL LOW (ref 8–27)
Bilirubin Total: 1.3 mg/dL — ABNORMAL HIGH (ref 0.0–1.2)
CO2: 27 mmol/L (ref 20–29)
Calcium: 9.5 mg/dL (ref 8.7–10.3)
Chloride: 99 mmol/L (ref 96–106)
Creatinine, Ser: 0.89 mg/dL (ref 0.57–1.00)
Globulin, Total: 2.5 g/dL (ref 1.5–4.5)
Glucose: 103 mg/dL — ABNORMAL HIGH (ref 70–99)
Potassium: 4.6 mmol/L (ref 3.5–5.2)
Sodium: 140 mmol/L (ref 134–144)
Total Protein: 6.9 g/dL (ref 6.0–8.5)
eGFR: 73 mL/min/1.73 (ref >59)

## 2023-12-23 LAB — LIPID PANEL
Chol/HDL Ratio: 3.1 ratio (ref 0.0–4.4)
Cholesterol, Total: 150 mg/dL (ref 100–199)
HDL: 48 mg/dL (ref >39)
LDL Chol Calc (NIH): 81 mg/dL (ref 0–99)
Triglycerides: 117 mg/dL (ref 0–149)
VLDL Cholesterol Cal: 21 mg/dL (ref 5–40)

## 2023-12-23 NOTE — Progress Notes (Signed)
Hello Caasi,  Your lab result is normal and/or stable.Some minor variations that are not significant are commonly marked abnormal, but do not represent any medical problem for you.  Best regards, Oshen Wlodarczyk, M.D.

## 2023-12-25 ENCOUNTER — Encounter: Payer: Self-pay | Admitting: Family Medicine

## 2023-12-25 DIAGNOSIS — M1711 Unilateral primary osteoarthritis, right knee: Secondary | ICD-10-CM | POA: Insufficient documentation

## 2024-03-29 ENCOUNTER — Ambulatory Visit: Admitting: Family Medicine

## 2024-03-30 ENCOUNTER — Ambulatory Visit: Payer: Self-pay | Admitting: Family Medicine

## 2024-03-30 ENCOUNTER — Ambulatory Visit: Admitting: Family Medicine

## 2024-03-30 ENCOUNTER — Encounter: Payer: Self-pay | Admitting: Family Medicine

## 2024-03-30 VITALS — BP 127/77 | HR 76 | Temp 98.1°F | Ht 65.0 in | Wt 189.0 lb

## 2024-03-30 DIAGNOSIS — M5432 Sciatica, left side: Secondary | ICD-10-CM

## 2024-03-30 DIAGNOSIS — E782 Mixed hyperlipidemia: Secondary | ICD-10-CM

## 2024-03-30 DIAGNOSIS — I10 Essential (primary) hypertension: Secondary | ICD-10-CM

## 2024-03-30 DIAGNOSIS — M1710 Unilateral primary osteoarthritis, unspecified knee: Secondary | ICD-10-CM

## 2024-03-30 DIAGNOSIS — E1159 Type 2 diabetes mellitus with other circulatory complications: Secondary | ICD-10-CM

## 2024-03-30 LAB — COMPREHENSIVE METABOLIC PANEL WITH GFR
ALT: 17 [IU]/L (ref 0–32)
AST: 16 [IU]/L (ref 0–40)
Albumin: 3.9 g/dL (ref 3.9–4.9)
Alkaline Phosphatase: 66 [IU]/L (ref 49–135)
BUN/Creatinine Ratio: 10 — ABNORMAL LOW (ref 12–28)
BUN: 10 mg/dL (ref 8–27)
Bilirubin Total: 0.8 mg/dL (ref 0.0–1.2)
CO2: 25 mmol/L (ref 20–29)
Calcium: 9.2 mg/dL (ref 8.7–10.3)
Chloride: 105 mmol/L (ref 96–106)
Creatinine, Ser: 1 mg/dL (ref 0.57–1.00)
Globulin, Total: 2.4 g/dL (ref 1.5–4.5)
Glucose: 160 mg/dL — ABNORMAL HIGH (ref 70–99)
Potassium: 4 mmol/L (ref 3.5–5.2)
Sodium: 143 mmol/L (ref 134–144)
Total Protein: 6.3 g/dL (ref 6.0–8.5)
eGFR: 64 mL/min/{1.73_m2}

## 2024-03-30 LAB — CBC WITH DIFFERENTIAL/PLATELET
Basophils Absolute: 0 10*3/uL (ref 0.0–0.2)
Basos: 1 %
EOS (ABSOLUTE): 0.2 10*3/uL (ref 0.0–0.4)
Eos: 4 %
Hematocrit: 43.1 % (ref 34.0–46.6)
Hemoglobin: 15.3 g/dL (ref 11.1–15.9)
Immature Grans (Abs): 0 10*3/uL (ref 0.0–0.1)
Immature Granulocytes: 0 %
Lymphocytes Absolute: 2 10*3/uL (ref 0.7–3.1)
Lymphs: 36 %
MCH: 33.6 pg — ABNORMAL HIGH (ref 26.6–33.0)
MCHC: 35.5 g/dL (ref 31.5–35.7)
MCV: 95 fL (ref 79–97)
Monocytes Absolute: 0.3 10*3/uL (ref 0.1–0.9)
Monocytes: 5 %
Neutrophils Absolute: 3 10*3/uL (ref 1.4–7.0)
Neutrophils: 54 %
Platelets: 196 10*3/uL (ref 150–450)
RBC: 4.55 x10E6/uL (ref 3.77–5.28)
RDW: 12 % (ref 11.7–15.4)
WBC: 5.5 10*3/uL (ref 3.4–10.8)

## 2024-03-30 LAB — BAYER DCA HB A1C WAIVED: HB A1C (BAYER DCA - WAIVED): 6.4 % — ABNORMAL HIGH (ref 4.8–5.6)

## 2024-03-30 MED ORDER — ROSUVASTATIN CALCIUM 10 MG PO TABS: 10.0000 mg | ORAL_TABLET | Freq: Every day | ORAL | 1 refills | Status: AC

## 2024-03-30 MED ORDER — GABAPENTIN 300 MG PO CAPS: 600.0000 mg | ORAL_CAPSULE | Freq: Two times a day (BID) | ORAL | 3 refills | Status: AC

## 2024-03-30 NOTE — Progress Notes (Signed)
 "  Subjective:  Patient ID: Natalie Tanner, female    DOB: 11/12/1960  Age: 64 y.o. MRN: [id number]  CC: Medical Management of Chronic Issues   HPI  Discussed the use of AI scribe software for clinical note transcription with the patient, who gave verbal consent to proceed.  History of Present Illness     presents forFollow-up of diabetes. Patient checks blood sugar at home.  Patient denies symptoms such as polyuria, polydipsia, excessive hunger, nausea No significant hypoglycemic spells noted. Medications reviewed. Pt reports taking them regularly without complication/adverse reaction being reported today.  Lab Results  Component Value Date   HGBA1C 6.4 (H) 03/30/2024   HGBA1C 6.9 (H) 12/22/2023   HGBA1C 7.3 (H) 09/15/2023    presents for  follow-up of hypertension. Patient has no history of headache chest pain or shortness of breath or recent cough. Patient also denies symptoms of TIA such as focal numbness or weakness. Patient denies side effects from medication. States taking it regularly.   in for follow-up of elevated cholesterol. Doing well without complaints on current medication. Denies side effects of statin including myalgia and arthralgia and nausea. Currently no chest pain, shortness of breath or other cardiovascular related symptoms noted.           03/30/2024    2:43 PM 12/22/2023    3:27 PM 09/15/2023    3:24 PM  Depression screen PHQ 2/9  Decreased Interest 3 2 1   Down, Depressed, Hopeless 2 1 1   PHQ - 2 Score 5 3 2   Altered sleeping 0 1 1  Tired, decreased energy 2 3 2   Change in appetite 0 1 1  Feeling bad or failure about yourself  1 1 1   Trouble concentrating 1 1 2   Moving slowly or fidgety/restless 2 2 2   Suicidal thoughts 0 0 0  PHQ-9 Score 11 12  11    Difficult doing work/chores Very difficult  Somewhat difficult     Data saved with a previous flowsheet row definition    History Natalie Tanner has a past medical history of Acute renal  failure (ARF), Anxiety, Coronary atherosclerosis of native coronary artery, Depression, Essential hypertension, GERD (gastroesophageal reflux disease), History of kidney stones, Iron deficiency anemia, Mixed hyperlipidemia, Myocardial infarction (HCC), Nephrolithiasis, Stroke (HCC) (2017), and Type 2 diabetes mellitus (HCC).   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   Her family history includes Other in her father; Stroke in her father.She reports that she quit smoking about 23 years ago. Her smoking use included cigarettes. She started smoking about 43 years ago. She has a 20 pack-year smoking history. She has never used smokeless tobacco. She reports that she does not drink alcohol and does not use drugs.    ROS Review of Systems  Constitutional: Negative.   HENT:  Negative for congestion.   Eyes:  Negative for visual disturbance.  Respiratory:  Negative for shortness of breath.   Cardiovascular:  Negative for chest pain.  Gastrointestinal:  Negative for abdominal pain, constipation, diarrhea, nausea and vomiting.  Genitourinary:  Negative for difficulty urinating.  Musculoskeletal:  Positive for arthralgias. Negative for myalgias.  Neurological:  Negative for headaches.  Psychiatric/Behavioral:  Negative for sleep disturbance.     Objective:  BP 127/77   Pulse 76   Temp 98.1 F (36.7 C)   Ht 5' 5 (1.651 m)   Wt 189 lb (85.7 kg)   LMP 07/05/2010  SpO2 97%   BMI 31.45 kg/m   BP Readings from Last 3 Encounters:  03/30/24 127/77  12/22/23 124/79  09/15/23 113/72    Wt Readings from Last 3 Encounters:  03/30/24 189 lb (85.7 kg)  12/22/23 189 lb 6.4 oz (85.9 kg)  06/16/23 187 lb (84.8 kg)     Physical Exam Constitutional:      General: She is not in acute distress.    Appearance: She is well-developed.  Cardiovascular:     Rate and Rhythm: Normal rate and  regular rhythm.  Pulmonary:     Breath sounds: Normal breath sounds.  Musculoskeletal:        General: Normal range of motion.  Skin:    General: Skin is warm and dry.  Neurological:     Mental Status: She is alert and oriented to person, place, and time.    Physical Exam    Assessment & Plan:  Type 2 diabetes mellitus with vascular disease (HCC) -     Bayer DCA Hb A1c Waived -     CBC with Differential/Platelet -     Comprehensive metabolic panel with GFR  Essential hypertension, benign -     CBC with Differential/Platelet  Mixed hyperlipidemia -     Comprehensive metabolic panel with GFR  Arthritis of knee -     Ambulatory referral to Orthopedics  Left sciatic nerve pain -     Gabapentin ; Take 2 capsules (600 mg total) by mouth 2 (two) times daily.  Dispense: 360 capsule; Refill: 3  Other orders -     Rosuvastatin  Calcium ; Take 1 tablet (10 mg total) by mouth daily.  Dispense: 90 tablet; Refill: 1   Assessment & Plan        Follow-up: Return in about 3 months (around 06/28/2024).  Butler Der, M.D. "

## 2024-06-29 ENCOUNTER — Ambulatory Visit: Admitting: Family Medicine
